# Patient Record
Sex: Male | Born: 1945 | Race: White | Hispanic: No | Marital: Married | Smoking: Current every day smoker
Health system: Southern US, Community
[De-identification: ages and names within clinical notes are randomized; demographics above are authoritative.]

## PROBLEM LIST (undated history)

## (undated) DIAGNOSIS — K2100 Gastro-esophageal reflux disease with esophagitis, without bleeding: Secondary | ICD-10-CM

## (undated) DIAGNOSIS — R0600 Dyspnea, unspecified: Secondary | ICD-10-CM

## (undated) DIAGNOSIS — K635 Polyp of colon: Secondary | ICD-10-CM

## (undated) DIAGNOSIS — S0590XA Unspecified injury of unspecified eye and orbit, initial encounter: Secondary | ICD-10-CM

## (undated) DIAGNOSIS — Z8619 Personal history of other infectious and parasitic diseases: Secondary | ICD-10-CM

## (undated) DIAGNOSIS — K219 Gastro-esophageal reflux disease without esophagitis: Secondary | ICD-10-CM

## (undated) DIAGNOSIS — J449 Chronic obstructive pulmonary disease, unspecified: Secondary | ICD-10-CM

## (undated) DIAGNOSIS — J189 Pneumonia, unspecified organism: Secondary | ICD-10-CM

## (undated) DIAGNOSIS — C3490 Malignant neoplasm of unspecified part of unspecified bronchus or lung: Secondary | ICD-10-CM

## (undated) DIAGNOSIS — G4730 Sleep apnea, unspecified: Secondary | ICD-10-CM

## (undated) DIAGNOSIS — D369 Benign neoplasm, unspecified site: Secondary | ICD-10-CM

## (undated) DIAGNOSIS — E669 Obesity, unspecified: Secondary | ICD-10-CM

## (undated) DIAGNOSIS — K2270 Barrett's esophagus without dysplasia: Secondary | ICD-10-CM

## (undated) DIAGNOSIS — Z8701 Personal history of pneumonia (recurrent): Secondary | ICD-10-CM

## (undated) DIAGNOSIS — J45909 Unspecified asthma, uncomplicated: Secondary | ICD-10-CM

## (undated) HISTORY — PX: COLONOSCOPY: SHX174

## (undated) HISTORY — PX: EYE SURGERY: SHX253

## (undated) HISTORY — PX: CATARACT EXTRACTION: SUR2

## (undated) HISTORY — DX: Malignant neoplasm of unspecified part of unspecified bronchus or lung: C34.90

---

## 2005-09-30 ENCOUNTER — Ambulatory Visit: Payer: Self-pay | Admitting: Ophthalmology

## 2007-08-26 ENCOUNTER — Ambulatory Visit: Payer: Self-pay | Admitting: Ophthalmology

## 2007-09-07 ENCOUNTER — Ambulatory Visit: Payer: Self-pay | Admitting: Ophthalmology

## 2007-11-27 ENCOUNTER — Ambulatory Visit: Payer: Self-pay | Admitting: Gastroenterology

## 2012-03-27 ENCOUNTER — Ambulatory Visit: Payer: Self-pay | Admitting: Gastroenterology

## 2013-04-08 ENCOUNTER — Ambulatory Visit: Payer: Self-pay | Admitting: Hematology and Oncology

## 2013-04-09 LAB — CBC CANCER CENTER
BASOS ABS: 1 %
Bands: 4 %
Basophil #: 0 x10 3/mm (ref 0.0–0.1)
Basophil %: 0.3 %
Comment - H1-Com5: NORMAL
EOS PCT: 2 %
Eosinophil #: 0.2 x10 3/mm (ref 0.0–0.7)
Eosinophil %: 1.2 %
HCT: 53.8 % — ABNORMAL HIGH (ref 40.0–52.0)
HGB: 18.1 g/dL — ABNORMAL HIGH (ref 13.0–18.0)
Lymphocyte #: 3.8 x10 3/mm — ABNORMAL HIGH (ref 1.0–3.6)
Lymphocyte %: 29.1 %
Lymphocytes: 18 %
MCH: 31.1 pg (ref 26.0–34.0)
MCHC: 33.6 g/dL (ref 32.0–36.0)
MCV: 93 fL (ref 80–100)
MONOS PCT: 8 %
Monocyte #: 1.1 x10 3/mm — ABNORMAL HIGH (ref 0.2–1.0)
Monocyte %: 8.2 %
NEUTROS ABS: 8.1 x10 3/mm — AB (ref 1.4–6.5)
Neutrophil %: 61.2 %
Platelet: 169 x10 3/mm (ref 150–440)
RBC: 5.8 10*6/uL (ref 4.40–5.90)
RDW: 14.3 % (ref 11.5–14.5)
SEGMENTED NEUTROPHILS: 57 %
Variant Lymphocyte: 10 %
WBC: 13.1 x10 3/mm — ABNORMAL HIGH (ref 3.8–10.6)

## 2013-04-25 ENCOUNTER — Ambulatory Visit: Payer: Self-pay | Admitting: Hematology and Oncology

## 2013-05-13 LAB — CBC CANCER CENTER
BASOS PCT: 0.5 %
Basophil #: 0.1 x10 3/mm (ref 0.0–0.1)
EOS ABS: 0.2 x10 3/mm (ref 0.0–0.7)
Eosinophil %: 1.5 %
HCT: 51.8 % (ref 40.0–52.0)
HGB: 17.3 g/dL (ref 13.0–18.0)
Lymphocyte #: 3.2 x10 3/mm (ref 1.0–3.6)
Lymphocyte %: 28.3 %
MCH: 31 pg (ref 26.0–34.0)
MCHC: 33.4 g/dL (ref 32.0–36.0)
MCV: 93 fL (ref 80–100)
Monocyte #: 1 x10 3/mm (ref 0.2–1.0)
Monocyte %: 8.3 %
Neutrophil #: 7 x10 3/mm — ABNORMAL HIGH (ref 1.4–6.5)
Neutrophil %: 61.4 %
Platelet: 170 x10 3/mm (ref 150–440)
RBC: 5.59 10*6/uL (ref 4.40–5.90)
RDW: 14.6 % — ABNORMAL HIGH (ref 11.5–14.5)
WBC: 11.4 x10 3/mm — ABNORMAL HIGH (ref 3.8–10.6)

## 2013-05-26 ENCOUNTER — Ambulatory Visit: Payer: Self-pay | Admitting: Hematology and Oncology

## 2013-11-26 ENCOUNTER — Ambulatory Visit: Payer: Self-pay | Admitting: Unknown Physician Specialty

## 2015-05-18 ENCOUNTER — Encounter: Payer: Self-pay | Admitting: *Deleted

## 2015-05-19 ENCOUNTER — Encounter: Payer: Self-pay | Admitting: *Deleted

## 2015-05-19 ENCOUNTER — Ambulatory Visit: Payer: 59 | Admitting: Anesthesiology

## 2015-05-19 ENCOUNTER — Ambulatory Visit
Admission: RE | Admit: 2015-05-19 | Discharge: 2015-05-19 | Disposition: A | Discharge status: Home / Self Care | Payer: 59 | Source: Ambulatory Visit | Attending: Gastroenterology | Admitting: Gastroenterology

## 2015-05-19 ENCOUNTER — Encounter
Admission: RE | Disposition: A | Discharge status: Home / Self Care | Payer: Self-pay | Source: Ambulatory Visit | Attending: Gastroenterology

## 2015-05-19 DIAGNOSIS — D125 Benign neoplasm of sigmoid colon: Secondary | ICD-10-CM | POA: Insufficient documentation

## 2015-05-19 DIAGNOSIS — K2270 Barrett's esophagus without dysplasia: Secondary | ICD-10-CM | POA: Diagnosis present

## 2015-05-19 DIAGNOSIS — K5730 Diverticulosis of large intestine without perforation or abscess without bleeding: Secondary | ICD-10-CM | POA: Insufficient documentation

## 2015-05-19 DIAGNOSIS — K210 Gastro-esophageal reflux disease with esophagitis: Secondary | ICD-10-CM | POA: Insufficient documentation

## 2015-05-19 DIAGNOSIS — K2950 Unspecified chronic gastritis without bleeding: Secondary | ICD-10-CM | POA: Insufficient documentation

## 2015-05-19 DIAGNOSIS — K2960 Other gastritis without bleeding: Secondary | ICD-10-CM | POA: Insufficient documentation

## 2015-05-19 DIAGNOSIS — Z87891 Personal history of nicotine dependence: Secondary | ICD-10-CM | POA: Insufficient documentation

## 2015-05-19 DIAGNOSIS — Z6841 Body Mass Index (BMI) 40.0 and over, adult: Secondary | ICD-10-CM | POA: Insufficient documentation

## 2015-05-19 DIAGNOSIS — Z1211 Encounter for screening for malignant neoplasm of colon: Secondary | ICD-10-CM | POA: Diagnosis not present

## 2015-05-19 DIAGNOSIS — D124 Benign neoplasm of descending colon: Secondary | ICD-10-CM | POA: Insufficient documentation

## 2015-05-19 DIAGNOSIS — E669 Obesity, unspecified: Secondary | ICD-10-CM | POA: Insufficient documentation

## 2015-05-19 DIAGNOSIS — K3189 Other diseases of stomach and duodenum: Secondary | ICD-10-CM | POA: Insufficient documentation

## 2015-05-19 DIAGNOSIS — D123 Benign neoplasm of transverse colon: Secondary | ICD-10-CM | POA: Insufficient documentation

## 2015-05-19 DIAGNOSIS — K2210 Ulcer of esophagus without bleeding: Secondary | ICD-10-CM | POA: Diagnosis not present

## 2015-05-19 DIAGNOSIS — G4730 Sleep apnea, unspecified: Secondary | ICD-10-CM | POA: Diagnosis not present

## 2015-05-19 DIAGNOSIS — K2980 Duodenitis without bleeding: Secondary | ICD-10-CM | POA: Insufficient documentation

## 2015-05-19 HISTORY — DX: Pneumonia, unspecified organism: J18.9

## 2015-05-19 HISTORY — DX: Polyp of colon: K63.5

## 2015-05-19 HISTORY — DX: Barrett's esophagus without dysplasia: K22.70

## 2015-05-19 HISTORY — DX: Unspecified injury of unspecified eye and orbit, initial encounter: S05.90XA

## 2015-05-19 HISTORY — DX: Personal history of other infectious and parasitic diseases: Z86.19

## 2015-05-19 HISTORY — DX: Personal history of pneumonia (recurrent): Z87.01

## 2015-05-19 HISTORY — PX: ESOPHAGOGASTRODUODENOSCOPY (EGD) WITH PROPOFOL: SHX5813

## 2015-05-19 HISTORY — DX: Sleep apnea, unspecified: G47.30

## 2015-05-19 HISTORY — PX: COLONOSCOPY WITH PROPOFOL: SHX5780

## 2015-05-19 HISTORY — DX: Benign neoplasm, unspecified site: D36.9

## 2015-05-19 HISTORY — DX: Obesity, unspecified: E66.9

## 2015-05-19 SURGERY — COLONOSCOPY WITH PROPOFOL
Anesthesia: General

## 2015-05-19 MED ORDER — FENTANYL CITRATE (PF) 100 MCG/2ML IJ SOLN
INTRAMUSCULAR | Status: DC | PRN
  Administered 2015-05-19: 50 ug via INTRAVENOUS

## 2015-05-19 MED ORDER — PROPOFOL 10 MG/ML IV BOLUS
INTRAVENOUS | Status: DC | PRN
  Administered 2015-05-19: 100 mg via INTRAVENOUS

## 2015-05-19 MED ORDER — SODIUM CHLORIDE 0.9 % IV SOLN
INTRAVENOUS | Status: DC
  Administered 2015-05-19: 10:00:00 via INTRAVENOUS

## 2015-05-19 MED ORDER — PROPOFOL 500 MG/50ML IV EMUL
INTRAVENOUS | Status: DC | PRN
  Administered 2015-05-19: 150 ug/kg/min via INTRAVENOUS

## 2015-05-19 MED ORDER — PHENYLEPHRINE HCL 10 MG/ML IJ SOLN
INTRAMUSCULAR | Status: DC | PRN
  Administered 2015-05-19: 50 ug via INTRAVENOUS
  Administered 2015-05-19: 150 ug via INTRAVENOUS

## 2015-05-19 MED ORDER — SODIUM CHLORIDE 0.9 % IV SOLN: INTRAVENOUS | Status: DC

## 2015-05-19 NOTE — Transfer of Care (Signed)
Immediate Anesthesia Transfer of Care Note  Patient: David Johnson  Procedure(s) Performed: Procedure(s): COLONOSCOPY WITH PROPOFOL (N/A) ESOPHAGOGASTRODUODENOSCOPY (EGD) WITH PROPOFOL (N/A)  Patient Location: PACU  Anesthesia Type:General  Level of Consciousness: awake and alert   Airway & Oxygen Therapy: Patient Spontanous Breathing and Patient connected to nasal cannula oxygen  Post-op Assessment: Report given to RN and Post -op Vital signs reviewed and stable  Post vital signs: Reviewed and stable  Last Vitals:  Filed Vitals:   05/19/15 1002  BP: 154/66  Pulse: 58  Temp: 36.4 C  Resp: 16    Complications: No apparent anesthesia complications

## 2015-05-19 NOTE — Anesthesia Preprocedure Evaluation (Signed)
Anesthesia Evaluation  Patient identified by MRN, date of birth, ID band Patient awake    Reviewed: Allergy & Precautions, H&P , NPO status , Patient's Chart, lab work & pertinent test results, reviewed documented beta blocker date and time   Airway Mallampati: II   Neck ROM: full    Dental  (+) Poor Dentition   Pulmonary neg pulmonary ROS, sleep apnea and Continuous Positive Airway Pressure Ventilation , pneumonia, resolved, former smoker,    Pulmonary exam normal        Cardiovascular negative cardio ROS Normal cardiovascular exam     Neuro/Psych negative neurological ROS  negative psych ROS   GI/Hepatic negative GI ROS, Neg liver ROS,   Endo/Other  negative endocrine ROS  Renal/GU negative Renal ROS  negative genitourinary   Musculoskeletal   Abdominal   Peds  Hematology negative hematology ROS (+)   Anesthesia Other Findings Past Medical History:   Adenomatous polyps                                           Barrett's esophagus                                          Colon polyp                                                  Eye injury                                                   History of gonorrhea                                         History of pneumonia                                         Obesity                                                      Sleep apnea                                                  Pneumonia                                                  Past Surgical History:   EYE SURGERY  CATARACT EXTRACTION                             Left              COLONOSCOPY                                                 BMI    Body Mass Index   47.45 kg/m 2     Reproductive/Obstetrics negative OB ROS                             Anesthesia Physical Anesthesia Plan  ASA: III  Anesthesia Plan: General    Post-op Pain Management:    Induction:   Airway Management Planned:   Additional Equipment:   Intra-op Plan:   Post-operative Plan:   Informed Consent: I have reviewed the patients History and Physical, chart, labs and discussed the procedure including the risks, benefits and alternatives for the proposed anesthesia with the patient or authorized representative who has indicated his/her understanding and acceptance.   Dental Advisory Given  Plan Discussed with: CRNA  Anesthesia Plan Comments:         Anesthesia Quick Evaluation

## 2015-05-19 NOTE — H&P (Addendum)
Outpatient short stay form Pre-procedure 05/19/2015 11:21 AM David Sails MD  Primary Physician: Dr. Maryland Johnson  Reason for visit:  EGD and colonoscopy  History of present illness:  Patient is a 70 year old male presenting today as above. He has a personal history of Barrett's esophagus as well as adenomatous colon polyps. His last procedures were 2013. He had multiple adenomatous colon polyps. EGD at that time however did not show any evidence of Barrett's esophagus. Overall currently he is doing well. He denies any abdominal pain and dyspepsia and nausea vomiting problems swallowing bowel habit changes melena or hematochezia. He does have a history of sleep apnea.    Current facility-administered medications:  .  0.9 %  sodium chloride infusion, , Intravenous, Continuous, David Sails, MD, Last Rate: 20 mL/hr at 05/19/15 1023 .  0.9 %  sodium chloride infusion, , Intravenous, Continuous, David Sails, MD  No prescriptions prior to admission     Allergies  Allergen Reactions  . Cephalexin      Past Medical History  Diagnosis Date  . Adenomatous polyps   . Barrett's esophagus   . Colon polyp   . Eye injury   . History of gonorrhea   . History of pneumonia   . Obesity   . Sleep apnea   . Pneumonia     Review of systems:      Physical Exam    Heart and lungs: Regular rate and rhythm without rub or gallop, lungs are bilaterally clear.    HEENT: Normocephalic atraumatic eyes are anicteric    Other:     Pertinant exam for procedure: Soft protuberant nontender nondistended bowel sounds are positive    Planned proceedures: EGD and colonoscopy with indicated procedures. I have discussed the risks benefits and complications of procedures to include not limited to bleeding, infection, perforation and the risk of sedation and the patient wishes to proceed.    David Sails, MD Gastroenterology 05/19/2015  11:21 AM

## 2015-05-19 NOTE — Op Note (Signed)
[facility] Gastroenterology Patient Name: David Johnson Procedure Date: 05/19/2015 11:29 AM MRN: [id number] Account #: [id number] Date of Birth: 11-Mar-1945 Admit Type: Outpatient Age: 70 Room: [facility] ENDO ROOM 3 Gender: Male Note Status: Finalized Procedure:            Colonoscopy Indications:          Personal history of colonic polyps Providers:            [name], MD Referring MD:         Hewitt Blade. Sarina Ser, MD (Referring MD) Medicines:            Monitored Anesthesia Care Complications:        No immediate complications. Procedure:            Pre-Anesthesia Assessment:                       - ASA Grade Assessment: III - A patient with severe                        systemic disease.                       After obtaining informed consent, the colonoscope was                        passed under direct vision. Throughout the procedure,                        the patient's blood pressure, pulse, and oxygen                        saturations were monitored continuously. The                        Colonoscope was introduced through the anus and                        advanced to the the cecum, identified by appendiceal                        orifice and ileocecal valve. The colonoscopy was                        unusually difficult due to poor bowel prep, significant                        looping and a tortuous colon. Successful completion of                        the procedure was aided by changing the patient to a                        supine position, changing the patient to a prone                        position, using manual pressure, withdrawing and                        reinserting the scope, applying abdominal pressure and  lavage. The quality of the bowel preparation was fair. Findings:      A 5 mm polyp was found in the descending colon. The polyp was sessile.       The polyp was removed with a cold snare. Resection and  retrieval were       complete.      A 3 mm polyp was found in the transverse colon. The polyp was flat. The       polyp was removed with a cold biopsy forceps. Resection and retrieval       were complete.      Three flat polyps were found in the descending colon. The polyps were 2       to 3 mm in size. These polyps were removed with a cold biopsy forceps.       Resection and retrieval were complete.      A 2 mm polyp was found in the distal descending colon. The polyp was       flat. The polyp was removed with a cold biopsy forceps. Resection and       retrieval were complete.      Three sessile polyps were found in the sigmoid colon. The polyps were 2       to 3 mm in size. These polyps were removed with a cold biopsy forceps.       Resection and retrieval were complete.      Multiple small-mouthed diverticula were found in the sigmoid colon and       descending colon.      The retroflexed view of the distal rectum and anal verge was normal and       showed no anal or rectal abnormalities.      The digital rectal exam was normal.      The base of the cecum couold not be fully intubated due top body habitus       and looping, however the base was seen from proximal ascending and       appeared normal. Impression:           - Preparation of the colon was fair.                       - One 5 mm polyp in the descending colon, removed with                        a cold snare. Resected and retrieved.                       - One 3 mm polyp in the transverse colon, removed with                        a cold biopsy forceps. Resected and retrieved.                       - Three 2 to 3 mm polyps in the descending colon,                        removed with a cold biopsy forceps. Resected and                        retrieved.                       -  One 2 mm polyp in the distal descending colon,                        removed with a cold biopsy forceps. Resected and                         retrieved.                       - Three 2 to 3 mm polyps in the sigmoid colon, removed                        with a cold biopsy forceps. Resected and retrieved.                       - Diverticulosis in the sigmoid colon and in the                        descending colon.                       - The distal rectum and anal verge are normal on                        retroflexion view. Recommendation:       - Discharge patient to home.                       - Await pathology results.                       - Telephone GI clinic for pathology results in 1 week. Procedure Code(s):    --- Professional ---                       [phone], Colonoscopy, flexible; with removal of tumor(s),                        polyp(s), or other lesion(s) by snare technique                       [id number], 85, Colonoscopy, flexible; with biopsy, single                        or multiple Diagnosis Code(s):    --- Professional ---                       D12.3, Benign neoplasm of transverse colon (hepatic                        flexure or splenic flexure)                       D12.4, Benign neoplasm of descending colon                       D12.5, Benign neoplasm of sigmoid colon                       Z86.010, Personal history of colonic polyps  K57.30, Diverticulosis of large intestine without                        perforation or abscess without bleeding CPT copyright 2016 American Medical Association. All rights reserved. The codes documented in this report are preliminary and upon coder review may  be revised to meet current compliance requirements. [name], MD 05/19/2015 1:00:57 PM This report has been signed electronically. Number of Addenda: 0 Note Initiated On: 05/19/2015 11:29 AM Scope Withdrawal Time: 0 hours 24 minutes 15 seconds  Total Procedure Duration: 0 hours 47 minutes 25 seconds       [facility]

## 2015-05-19 NOTE — Op Note (Signed)
[facility] Gastroenterology Patient Name: David Johnson Procedure Date: 05/19/2015 11:30 AM MRN: [id number] Account #: [id number] Date of Birth: [birth year] Admit Type: Outpatient Age: 70 Room: [facility] ENDO ROOM 3 Gender: Male Note Status: Finalized Procedure:            Upper GI endoscopy Indications:          Follow-up of Barrett's esophagus Providers:            [name], MD Referring MD:         Hewitt Blade. Sarina Ser, MD (Referring MD) Medicines:            Monitored Anesthesia Care Complications:        No immediate complications. Procedure:            Pre-Anesthesia Assessment:                       - ASA Grade Assessment: III - A patient with severe                        systemic disease.                       After obtaining informed consent, the endoscope was                        passed under direct vision. Throughout the procedure,                        the patient's blood pressure, pulse, and oxygen                        saturations were monitored continuously. The Endoscope                        was introduced through the mouth, and advanced to the                        third part of duodenum. The upper GI endoscopy was                        accomplished without difficulty. The patient tolerated                        the procedure well. Findings:      LA Grade D (one or more mucosal breaks involving at least 75% of       esophageal circumference) esophagitis with no bleeding was found.       Biopsies were taken with a cold forceps for histology. Mucosa was       biopsied with a cold forceps for histology in 4 quadrants at the       gastroesophageal junction.      The exam of the esophagus was otherwise normal.      Patchy moderate inflammation characterized by congestion (edema),       erythema, friability and granularity was found in the gastric body and       in the gastric antrum. Biopsies were taken with a cold forceps for   histology. Biopsies were taken with a cold forceps for histology and       Helicobacter pylori testing.      A single 7  mm submucosal papule (nodule) with no bleeding and no       stigmata of recent bleeding was found on the posterior wall of the       gastric body. Biopsies were taken with a cold forceps for histology.      A single 3 mm mucosal papule (nodule) with no bleeding and no stigmata       of recent bleeding was found in the gastric fundus. Biopsies were taken       with a cold forceps for histology.      Diffuse and patchy moderate inflammation characterized by congestion       (edema) and erythema was found in the duodenal bulb. Biopsies were taken       with a cold forceps for histology.      The exam of the duodenum was otherwise normal.      The cardia and gastric fundus were normal on retroflexion otherwise. Impression:           - LA Grade D erosive esophagitis. Biopsied.                       - Erosive gastritis. Biopsied.                       - A single submucosal papule (nodule) found in the                        stomach. Biopsied.                       - A single mucosal papule (nodule) found in the                        stomach. Biopsied.                       - Duodenitis. Biopsied. Recommendation:       - Return to GI office in 3 weeks.                       - Await pathology results.                       - Use Protonix (pantoprazole) 40 mg PO daily daily. Procedure Code(s):    --- Professional ---                       [phone], Esophagogastroduodenoscopy, flexible, transoral;                        with biopsy, single or multiple Diagnosis Code(s):    --- Professional ---                       K20.8, Other esophagitis                       K29.60, Other gastritis without bleeding                       K31.89, Other diseases of stomach and duodenum                       K29.80, Duodenitis without bleeding  K22.70, Barrett's esophagus  without dysplasia CPT copyright 2016 American Medical Association. All rights reserved. The codes documented in this report are preliminary and upon coder review may  be revised to meet current compliance requirements. [name], MD 05/19/2015 12:04:48 PM This report has been signed electronically. Number of Addenda: 0 Note Initiated On: 05/19/2015 11:30 AM      [facility]

## 2015-05-20 ENCOUNTER — Encounter: Payer: Self-pay | Admitting: Gastroenterology

## 2015-05-22 NOTE — Anesthesia Postprocedure Evaluation (Signed)
Anesthesia Post Note  Patient: David Johnson  Procedure(s) Performed: Procedure(s) (LRB): COLONOSCOPY WITH PROPOFOL (N/A) ESOPHAGOGASTRODUODENOSCOPY (EGD) WITH PROPOFOL (N/A)  Patient location during evaluation: PACU Anesthesia Type: General Level of consciousness: awake and alert Pain management: pain level controlled Vital Signs Assessment: post-procedure vital signs reviewed and stable Respiratory status: spontaneous breathing, nonlabored ventilation, respiratory function stable and patient connected to nasal cannula oxygen Cardiovascular status: blood pressure returned to baseline and stable Postop Assessment: no signs of nausea or vomiting Anesthetic complications: no    Last Vitals:  Filed Vitals:   05/19/15 1313 05/19/15 1324  BP: 101/59 121/90  Pulse: 85 85  Temp:    Resp: 15 20    Last Pain:  Filed Vitals:   05/20/15 1306  PainSc: 0-No pain                 Molli Barrows

## 2015-05-23 LAB — SURGICAL PATHOLOGY

## 2015-12-04 ENCOUNTER — Ambulatory Visit (INDEPENDENT_AMBULATORY_CARE_PROVIDER_SITE_OTHER): Payer: 59 | Admitting: Vascular Surgery

## 2016-05-30 ENCOUNTER — Inpatient Hospital Stay: Payer: 59 | Admitting: Oncology

## 2016-06-03 ENCOUNTER — Ambulatory Visit
Admission: RE | Admit: 2016-06-03 | Discharge: 2016-06-03 | Disposition: A | Discharge status: Home / Self Care | Payer: 59 | Source: Ambulatory Visit | Attending: Oncology | Admitting: Oncology

## 2016-06-03 ENCOUNTER — Encounter: Payer: Self-pay | Admitting: Oncology

## 2016-06-03 ENCOUNTER — Inpatient Hospital Stay: Payer: 59 | Attending: Oncology | Admitting: Oncology

## 2016-06-03 VITALS — BP 144/82 | HR 81 | Temp 97.9°F | Resp 20 | Ht 72.84 in | Wt 334.9 lb

## 2016-06-03 DIAGNOSIS — D71 Functional disorders of polymorphonuclear neutrophils: Secondary | ICD-10-CM | POA: Diagnosis not present

## 2016-06-03 DIAGNOSIS — G4730 Sleep apnea, unspecified: Secondary | ICD-10-CM | POA: Insufficient documentation

## 2016-06-03 DIAGNOSIS — K219 Gastro-esophageal reflux disease without esophagitis: Secondary | ICD-10-CM | POA: Insufficient documentation

## 2016-06-03 DIAGNOSIS — D72829 Elevated white blood cell count, unspecified: Secondary | ICD-10-CM | POA: Diagnosis not present

## 2016-06-03 DIAGNOSIS — D751 Secondary polycythemia: Secondary | ICD-10-CM

## 2016-06-03 DIAGNOSIS — K2270 Barrett's esophagus without dysplasia: Secondary | ICD-10-CM | POA: Diagnosis not present

## 2016-06-03 DIAGNOSIS — G8929 Other chronic pain: Secondary | ICD-10-CM | POA: Diagnosis not present

## 2016-06-03 DIAGNOSIS — G4733 Obstructive sleep apnea (adult) (pediatric): Secondary | ICD-10-CM | POA: Insufficient documentation

## 2016-06-03 DIAGNOSIS — E669 Obesity, unspecified: Secondary | ICD-10-CM | POA: Insufficient documentation

## 2016-06-03 DIAGNOSIS — Z79899 Other long term (current) drug therapy: Secondary | ICD-10-CM

## 2016-06-03 DIAGNOSIS — F17210 Nicotine dependence, cigarettes, uncomplicated: Secondary | ICD-10-CM | POA: Insufficient documentation

## 2016-06-03 DIAGNOSIS — M549 Dorsalgia, unspecified: Secondary | ICD-10-CM | POA: Diagnosis not present

## 2016-06-03 NOTE — Progress Notes (Signed)
New patient for evaluation of elevated hemoglobin. Patient has some emphysema, not officially diagnosed. He smokes 1-1/2 packs cigarettes per day with a > 40 yr history.  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 No recent changes in appetite or energy.

## 2016-06-03 NOTE — Progress Notes (Signed)
Hematology/Oncology Consult note [facility] [phone] [phone]  Patient Care Team: Maryland Pink, MD as PCP - General (Family Medicine)   Name of the patient: David Johnson  [id number]  07-11-1945    Reason for referral- polycythemia   Referring physician- Dr. Maryland Pink  Date of visit: 06/03/16   History of presenting illness- patient is a 71 year old male with a past medical history significant for morbid obesity, GERD, obstructive sleep apnea and Barrett's esophagus. He is been referred to [place] for evaluation and management of polycythemia. Recent CBC from 05/17/2016 showed white count of 12.8, H&H of 19.8/55.5 and platelet count of 177. Looking back at his prior CBCs in 2017 as well as 2016 his H&H was 18.9/55.6 and 18.4/53.2 respectively. LFTs have been within normal limits. Recent urinalysis from March 2018 did not reveal any hematuria. Patient has had JAK2 and exon 12 testing in 2016 which was negative. 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 He is trying to cut down. He denies any symptoms of headaches, blurry vision, chest pain or strokelike symptoms. He has chronic back pain.  ECOG PS- 2  Pain scale- 1   Review of systems- Review of Systems  Constitutional: Negative for chills, fever, malaise/fatigue and weight loss.  HENT: Negative for congestion, ear discharge and nosebleeds.   Eyes: Negative for blurred vision.  Respiratory: Negative for cough, hemoptysis, sputum production, shortness of breath and wheezing.   Cardiovascular: Negative for chest pain, palpitations, orthopnea and claudication.  Gastrointestinal: Negative for abdominal pain, blood in stool, constipation,  diarrhea, heartburn, melena, nausea and vomiting.  Genitourinary: Negative for dysuria, flank pain, frequency, hematuria and urgency.  Musculoskeletal: Positive for back pain. Negative for joint pain and myalgias.  Skin: Negative for rash.  Neurological: Negative for dizziness, tingling, focal weakness, seizures, weakness and headaches.  Endo/Heme/Allergies: Does not bruise/bleed easily.  Psychiatric/Behavioral: Negative for depression and suicidal ideas. The patient does not have insomnia.     Allergies  Allergen Reactions  . Cephalexin     Patient Active Problem List   Diagnosis Date Noted  . Obesity 06/03/2016  . Sleep apnea 06/03/2016     Past Medical History:  Diagnosis Date  . Adenomatous polyps   . Barrett's esophagus   . Colon polyp   . Eye injury   . History of gonorrhea   . History of pneumonia   . Obesity   . Pneumonia   . Sleep apnea      Past Surgical History:  Procedure Laterality Date  . CATARACT EXTRACTION Left   . COLONOSCOPY    . COLONOSCOPY WITH PROPOFOL N/A 05/19/2015   Procedure: COLONOSCOPY WITH PROPOFOL;  Surgeon: [name], MD;  Location: [facility] ENDOSCOPY;  Service: Endoscopy;  Laterality: N/A;  . ESOPHAGOGASTRODUODENOSCOPY (EGD) WITH PROPOFOL N/A 05/19/2015   Procedure: ESOPHAGOGASTRODUODENOSCOPY (EGD) WITH PROPOFOL;  Surgeon: [name], MD;  Location: [facility] ENDOSCOPY;  Service: Endoscopy;  Laterality: N/A;  . EYE SURGERY      Social History   Social History  . Marital status: Married    Spouse name: N/A  . Number of children: N/A  . Years of education: N/A   Occupational History  . Not on file.   Social History Main Topics  .  Smoking status: Current Every Day Smoker    Packs/day: 1.00    Years: 30.00    Types: Cigarettes    Last attempt to quit: 03/04/2015  . Smokeless tobacco: Never Used  . Alcohol use Yes  . Drug use: No  . Sexual activity: Not on file   Other Topics Concern  . Not on file   Social History  Narrative  . No narrative on file     Family History  Problem Relation Age of Onset  . Emphysema Mother     No current outpatient prescriptions on file.   Physical exam:  Vitals:   06/03/16 1509  BP: (!) 144/82  Pulse: 81  Resp: 20  Temp: 97.9 F (36.6 C)  TempSrc: Tympanic  SpO2: 95%  Weight: (!) 334 lb 14.1 oz (151.9 kg)  Height: 6' 0.83" (1.85 m)   Physical Exam  Constitutional: He is oriented to person, place, and time.  Obese, ambulates with a cane. Appears in no acute distress  HENT:  Head: Normocephalic and atraumatic.  Eyes: EOM are normal. Pupils are equal, round, and reactive to light.  Neck: Normal range of motion.  Cardiovascular: Normal rate, regular rhythm and normal heart sounds.   Pulmonary/Chest: Effort normal and breath sounds normal.  Abdominal: Soft. Bowel sounds are normal.  Neurological: He is alert and oriented to person, place, and time.  Skin: Skin is warm and dry.       No flowsheet data found. CBC Latest Ref Rng & Units 05/13/2013  WBC 3.8 - 10.6 x10 3/mm  11.4(H)  Hemoglobin 13.0 - 18.0 g/dL 17.3  Hematocrit 40.0 - 52.0 % 51.8  Platelets 150 - 440 x10 3/mm  170     Assessment and plan- Patient is a 71 y.o. 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 JAK2 has been negative in the past. Today I will obtain a CBC with differential, CMP, EPO level. Urinalysis was already negative for hematuria. I will obtain chest xray. I will see the patient back in 2 weeks' time and discuss the results of his blood work and decide about phlebotomy at that time. If his EPO level comes back significantly elevated I will consider CT abdomen to rule out intra-abdominal malignancy causing tumor related polycythemia. I will discuss low dose screening CT scan given his significant h/o smoking next time.   Leucocytosis- suspect this is reactive given this is  chronic over last 3 years and has remained stable between 12-13. I will however check flow cytometry and bcr abl testing to r/o primary hematological disorder   Thank you for this kind referral and the opportunity to participate in the care of this patient   Visit Diagnosis 1. Polycythemia, secondary   2. Leukocytosis, unspecified type     Dr. Randa Evens, MD, MPH [facility] Pager- [phone] 06/03/2016  3:32 PM

## 2016-06-06 ENCOUNTER — Encounter: Payer: Self-pay | Admitting: Family Medicine

## 2016-06-18 ENCOUNTER — Inpatient Hospital Stay: Payer: 59 | Admitting: Oncology

## 2016-06-25 ENCOUNTER — Inpatient Hospital Stay: Payer: 59 | Attending: Oncology | Admitting: Oncology

## 2016-06-25 VITALS — BP 134/74 | HR 84 | Temp 96.6°F | Resp 20 | Wt 330.0 lb

## 2016-06-25 DIAGNOSIS — R0602 Shortness of breath: Secondary | ICD-10-CM | POA: Insufficient documentation

## 2016-06-25 DIAGNOSIS — G4730 Sleep apnea, unspecified: Secondary | ICD-10-CM

## 2016-06-25 DIAGNOSIS — F17210 Nicotine dependence, cigarettes, uncomplicated: Secondary | ICD-10-CM | POA: Diagnosis not present

## 2016-06-25 DIAGNOSIS — D72829 Elevated white blood cell count, unspecified: Secondary | ICD-10-CM | POA: Diagnosis not present

## 2016-06-25 DIAGNOSIS — E669 Obesity, unspecified: Secondary | ICD-10-CM | POA: Insufficient documentation

## 2016-06-25 DIAGNOSIS — G8929 Other chronic pain: Secondary | ICD-10-CM

## 2016-06-25 DIAGNOSIS — K219 Gastro-esophageal reflux disease without esophagitis: Secondary | ICD-10-CM | POA: Diagnosis not present

## 2016-06-25 DIAGNOSIS — M549 Dorsalgia, unspecified: Secondary | ICD-10-CM | POA: Insufficient documentation

## 2016-06-25 DIAGNOSIS — Z8711 Personal history of peptic ulcer disease: Secondary | ICD-10-CM | POA: Diagnosis not present

## 2016-06-25 DIAGNOSIS — K2270 Barrett's esophagus without dysplasia: Secondary | ICD-10-CM | POA: Insufficient documentation

## 2016-06-25 DIAGNOSIS — D751 Secondary polycythemia: Secondary | ICD-10-CM | POA: Diagnosis not present

## 2016-06-25 DIAGNOSIS — Z8701 Personal history of pneumonia (recurrent): Secondary | ICD-10-CM | POA: Insufficient documentation

## 2016-06-25 NOTE — Progress Notes (Signed)
Here for follow up

## 2016-06-26 ENCOUNTER — Inpatient Hospital Stay: Payer: 59

## 2016-06-26 DIAGNOSIS — D751 Secondary polycythemia: Secondary | ICD-10-CM | POA: Diagnosis not present

## 2016-06-27 ENCOUNTER — Encounter: Payer: Self-pay | Admitting: Oncology

## 2016-06-27 NOTE — Progress Notes (Signed)
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 He is been referred to [place] for evaluation and management of polycythemia. Recent CBC from 05/17/2016 showed white count of 12.8, H&H of 19.8/55.5 and platelet count of 177. Looking back at his prior CBCs in 2017 as well as 2016 his H&H was 18.9/55.6 and 18.4/53.2 respectively. LFTs have been within normal limits. Recent urinalysis from March 2018 did not reveal any hematuria. Patient has had JAK2 and exon 12 testing in 2016 which was negative. 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 He is trying to cut down. He denies any symptoms of headaches, blurry vision, chest pain or strokelike symptoms. He has chronic back pain.  Blood work from 06/03/16 again showed mild leucocytosis along with elevated hb of 19. Platelet counts normal. EPO level was normal and BCR abl testing was negative for CML  Interval history- Patient is trying to cut down on his smoking. Feels sob on exertion. Denies other complaints  ECOG PS- 1  Review of systems- Review of Systems  Constitutional: Negative for chills, fever, malaise/fatigue and weight loss.  HENT: Negative for congestion, ear discharge and  nosebleeds.   Eyes: Negative for blurred vision.  Respiratory: Negative for cough, hemoptysis, sputum production, shortness of breath and wheezing.   Cardiovascular: Negative for chest pain, palpitations, orthopnea and claudication.  Gastrointestinal: Negative for abdominal pain, blood in stool, constipation, diarrhea, heartburn, melena, nausea and vomiting.  Genitourinary: Negative for dysuria, flank pain, frequency, hematuria and urgency.  Musculoskeletal: Positive for back pain. Negative for joint pain and myalgias.  Skin: Negative for rash.  Neurological: Negative for dizziness, tingling, focal weakness, seizures, weakness and headaches.  Endo/Heme/Allergies: Does not bruise/bleed easily.  Psychiatric/Behavioral: Negative for depression and suicidal ideas. The patient does not have insomnia.       Allergies  Allergen Reactions  . Cephalexin Rash     Past Medical History:  Diagnosis Date  . Adenomatous polyps   . Barrett's esophagus   . Colon polyp   . Eye injury   . History of gonorrhea   . History of pneumonia   . Obesity   . Pneumonia   . Sleep apnea      Past Surgical History:  Procedure Laterality Date  . CATARACT EXTRACTION Left   . COLONOSCOPY    . COLONOSCOPY WITH PROPOFOL N/A 05/19/2015   Procedure: COLONOSCOPY WITH PROPOFOL;  Surgeon: [name], MD;  Location: [facility] ENDOSCOPY;  Service: Endoscopy;  Laterality: N/A;  . ESOPHAGOGASTRODUODENOSCOPY (EGD) WITH PROPOFOL N/A 05/19/2015   Procedure: ESOPHAGOGASTRODUODENOSCOPY (EGD) WITH PROPOFOL;  Surgeon: [name], MD;  Location: [facility] ENDOSCOPY;  Service: Endoscopy;  Laterality: N/A;  . EYE SURGERY      Social History   Social History  . Marital status: Married  Spouse name: N/A  . Number of children: N/A  . Years of education: N/A   Occupational History  . Not on file.   Social History Main Topics  . Smoking status: Current Every Day Smoker    Packs/day: 1.00    Years: 30.00    Types:  Cigarettes    Last attempt to quit: 03/04/2015  . Smokeless tobacco: Never Used  . Alcohol use Yes  . Drug use: No  . Sexual activity: Not on file   Other Topics Concern  . Not on file   Social History Narrative  . No narrative on file    Family History  Problem Relation Age of Onset  . Emphysema Mother     No current outpatient prescriptions on file.  Physical exam:  Vitals:   06/25/16 1541  BP: 134/74  Pulse: 84  Resp: 20  Temp: (!) 96.6 F (35.9 C)  TempSrc: Tympanic  Weight: (!) 330 lb (149.7 kg)   Physical Exam  Constitutional: He is oriented to person, place, and time.  Obese. apperas in no acute distress. Ambulates with a cane  HENT:  Head: Normocephalic and atraumatic.  Eyes: EOM are normal. Pupils are equal, round, and reactive to light.  Neck: Normal range of motion.  Cardiovascular: Normal rate, regular rhythm and normal heart sounds.   Pulmonary/Chest: Effort normal and breath sounds normal.  Abdominal: Soft. Bowel sounds are normal.  Neurological: He is alert and oriented to person, place, and time.  Skin: Skin is warm and dry.     No flowsheet data found. CBC Latest Ref Rng & Units 05/13/2013  WBC 3.8 - 10.6 x10 3/mm  11.4(H)  Hemoglobin 13.0 - 18.0 g/dL 17.3  Hematocrit 40.0 - 52.0 % 51.8  Platelets 150 - 440 x10 3/mm  170    No images are attached to the encounter.  Dg Chest 2 View  Result Date: 06/03/2016 CLINICAL DATA:  Productive cough EXAM: CHEST  2 VIEW COMPARISON:  None. FINDINGS: Cardiac shadow is at the upper limits of normal in size. The lungs are well aerated bilaterally. No focal infiltrate or sizable effusion is seen. Mild degenerative changes of thoracic spine are seen. Scattered small calcified granulomas are noted. IMPRESSION: Changes of prior granulomatous disease. No acute abnormality is seen. Electronically Signed   By: Inez Catalina M.D.   On: 06/03/2016 19:38     Assessment and plan- Patient is a 71 y.o. male referred to [place]  for leucocytosis and polycythemia  Polycythemia- his EPO level is normal and JAK2 and exon 12 testing negative making polycythemia very unlikely. His polycythemia likely due to smoking and/or OSA. However given that his hb is significantly elevated between 18-19 and has even exceeded 20 on one occasion, I would like to get BM bbiopsy to rule out myeloproliferative disorder. Typically, I would not be in favor of phlebotomy for secondary polycythemia but as his hemoglobin is close to 20, I am concerned about his risk of stroke and MI. I will proceed with phlebotomy Q2 weeks 250 cc until his hb <16 and hct <55. I will see him back in about 6 weeks after his BM biopsy  Leucocytosis- mild and likely reactive due to smoking. BM biopsy to rule out myeloproliferative disorder  Tobacco dependence- I will refer him to [facility] for low dose screening CT lung program. He is trying to quit smoking on his own   Visit Diagnosis 1. Leukocytosis, unspecified type   2. Polycythemia  Dr. Randa Evens, MD, MPH [place] at [facility] Pager- [phone] 06/27/2016 8:19 AM

## 2016-07-01 ENCOUNTER — Telehealth: Payer: Self-pay | Admitting: *Deleted

## 2016-07-01 NOTE — Telephone Encounter (Signed)
Patient contacted in regards to biopsy instructions. Date 07/09/2016 Time 7:30 AM Arrive at [facility] at the admitting/registration desk.  Must have driver to take you home.  Nothing by mouth after midnight the night before.  Take medications in AM with small sip of water.  Call Moishe Spice RN if you have questions.  Wife read back instructions and verbalized understandin.

## 2016-07-02 ENCOUNTER — Inpatient Hospital Stay: Payer: 59

## 2016-07-02 VITALS — BP 116/71 | HR 60 | Temp 96.3°F | Resp 18

## 2016-07-02 DIAGNOSIS — D751 Secondary polycythemia: Secondary | ICD-10-CM | POA: Diagnosis not present

## 2016-07-02 MED ORDER — SODIUM CHLORIDE 0.9 % IV SOLN
Freq: Once | INTRAVENOUS | Status: DC
  Filled 2016-07-02: qty 1000

## 2016-07-02 NOTE — Progress Notes (Signed)
Patient states, "I do not need any IV fluids today. I did not get any after my first phlebotomy either and did fine." MD, Dr. Janese Banks, notified via telephone. Per MD order: IVF discontinued. Patient can be discharged post phlebotomy.

## 2016-07-08 ENCOUNTER — Other Ambulatory Visit: Payer: Self-pay | Admitting: Radiology

## 2016-07-09 ENCOUNTER — Ambulatory Visit
Admission: RE | Admit: 2016-07-09 | Discharge: 2016-07-09 | Disposition: A | Discharge status: Home / Self Care | Payer: 59 | Source: Ambulatory Visit | Attending: Oncology | Admitting: Oncology

## 2016-07-09 ENCOUNTER — Other Ambulatory Visit (HOSPITAL_COMMUNITY)
Admission: RE | Admit: 2016-07-09 | Discharge: 2016-07-09 | Disposition: A | Discharge status: Home / Self Care | Payer: 59 | Source: Ambulatory Visit | Attending: Oncology | Admitting: Oncology

## 2016-07-09 DIAGNOSIS — D72829 Elevated white blood cell count, unspecified: Secondary | ICD-10-CM | POA: Insufficient documentation

## 2016-07-09 DIAGNOSIS — D751 Secondary polycythemia: Secondary | ICD-10-CM | POA: Insufficient documentation

## 2016-07-09 HISTORY — DX: Chronic obstructive pulmonary disease, unspecified: J44.9

## 2016-07-09 HISTORY — DX: Gastro-esophageal reflux disease without esophagitis: K21.9

## 2016-07-09 HISTORY — DX: Dyspnea, unspecified: R06.00

## 2016-07-09 LAB — CBC WITH DIFFERENTIAL/PLATELET
Basophils Absolute: 0.1 10*3/uL (ref 0–0.1)
Basophils Relative: 1 %
EOS PCT: 2 %
Eosinophils Absolute: 0.2 10*3/uL (ref 0–0.7)
HCT: 52 % (ref 40.0–52.0)
Hemoglobin: 17.8 g/dL (ref 13.0–18.0)
LYMPHS ABS: 2.5 10*3/uL (ref 1.0–3.6)
Lymphocytes Relative: 24 %
MCH: 31.7 pg (ref 26.0–34.0)
MCHC: 34.2 g/dL (ref 32.0–36.0)
MCV: 92.6 fL (ref 80.0–100.0)
MONO ABS: 0.9 10*3/uL (ref 0.2–1.0)
MONOS PCT: 8 %
Neutro Abs: 6.8 10*3/uL — ABNORMAL HIGH (ref 1.4–6.5)
Neutrophils Relative %: 65 %
PLATELETS: 181 10*3/uL (ref 150–440)
RBC: 5.61 MIL/uL (ref 4.40–5.90)
RDW: 14.2 % (ref 11.5–14.5)
WBC: 10.4 10*3/uL (ref 3.8–10.6)

## 2016-07-09 LAB — BASIC METABOLIC PANEL
Anion gap: 7 (ref 5–15)
BUN: 15 mg/dL (ref 6–20)
CHLORIDE: 109 mmol/L (ref 101–111)
CO2: 24 mmol/L (ref 22–32)
CREATININE: 0.81 mg/dL (ref 0.61–1.24)
Calcium: 9 mg/dL (ref 8.9–10.3)
GFR calc Af Amer: >60 mL/min (ref >60)
GFR calc non Af Amer: >60 mL/min (ref >60)
GLUCOSE: 103 mg/dL — AB (ref 65–99)
Potassium: 3.9 mmol/L (ref 3.5–5.1)
Sodium: 140 mmol/L (ref 135–145)

## 2016-07-09 LAB — PROTIME-INR
INR: 0.99
Prothrombin Time: 13.1 seconds (ref 11.4–15.2)

## 2016-07-09 MED ORDER — FENTANYL CITRATE (PF) 100 MCG/2ML IJ SOLN
INTRAMUSCULAR | Status: AC | PRN
  Administered 2016-07-09: 25 ug via INTRAVENOUS

## 2016-07-09 MED ORDER — HYDROCODONE-ACETAMINOPHEN 5-325 MG PO TABS
1.0000 | ORAL_TABLET | ORAL | Status: DC | PRN
  Filled 2016-07-09: qty 2

## 2016-07-09 MED ORDER — SODIUM CHLORIDE 0.9 % IV SOLN
INTRAVENOUS | Status: DC
  Administered 2016-07-09: 08:00:00 via INTRAVENOUS

## 2016-07-09 NOTE — Procedures (Signed)
Ct Bone Marrow Biopsy without difficulty  Complications:  None  Blood Loss: none  See dictation in canopy pacs  

## 2016-07-09 NOTE — Discharge Instructions (Signed)
Needle Biopsy of the Bone °A bone biopsy is a procedure in which a small sample of bone is removed. The sample is taken with a needle. Then, the bone sample is looked at under a microscope to check for abnormalities. The sample is usually taken from a bone that is close to the skin. This procedure may be done to check for various problems with the bone. You may need this procedure if imaging tests or blood tests have indicated a possible problem. This procedure may be done to help determine if a bone tumor is cancerous (malignant). A bone biopsy can help to diagnose problems such as: °· Tumors of the bone (sarcomas) and bone marrow (multiple myeloma). °· Bone that forms abnormally (Paget disease). °· Noncancerous (benign) bone cysts. °· Bony growths. °· Infections in the bone. °Tell a health care provider about: °· Any allergies you have. °· All medicines you are taking, including vitamins, herbs, eye drops, creams, and over-the-counter medicines. °· Any problems you or family members have had with anesthetic medicines. °· Any blood disorders you have. °· Any surgeries you have had. °· Any medical conditions you have. °What are the risks? °Generally, this is a safe procedure. However, problems may occur, including: °· Excessive bleeding. °· Infection. °· Injury to surrounding tissue. °What happens before the procedure? °· Ask your health care provider about: °¨ Changing or stopping your regular medicines. This is especially important if you are taking diabetes medicines or blood thinners. °¨ Taking medicines such as aspirin and ibuprofen. These medicines can thin your blood. Do not take these medicines before your procedure if your health care provider instructs you not to. °· Follow instructions from your health care provider about eating or drinking restrictions. °· Plan to have someone take you home after the procedure. °· If you go home right after the procedure, plan to have someone with you for 24 hours. °What  happens during the procedure? °· An IV tube may be inserted into one of your veins. °· The injection site will be cleaned with a germ-killing solution (antiseptic). °· You will be given one or more of the following: °¨ A medicine to help you relax (sedative). °¨ A medicine to numb the area (local anesthetic). °· The sample of bone will be removed by putting a large needle through the skin and into the bone. °· The needle will be removed. °· A bandage (dressing) will be placed over the insertion site and taped in place. °The procedure may vary among health care providers and hospitals. °What happens after the procedure? °· Your blood pressure, heart rate, breathing rate, and blood oxygen level will be monitored often until the medicines you were given have worn off. °· Return to your normal activities as told by your health care provider. °This information is not intended to replace advice given to you by your health care provider. Make sure you discuss any questions you have with your health care provider. °Document Released: 12/21/2003 Document Revised: 07/20/2015 Document Reviewed: 03/21/2014 °Elsevier Interactive Patient Education © 2017 [employer] Inc[employer] ° °

## 2016-07-16 ENCOUNTER — Inpatient Hospital Stay: Payer: 59

## 2016-07-16 DIAGNOSIS — D751 Secondary polycythemia: Secondary | ICD-10-CM | POA: Diagnosis not present

## 2016-07-23 LAB — CHROMOSOME ANALYSIS, BONE MARROW

## 2016-07-30 ENCOUNTER — Inpatient Hospital Stay: Payer: 59 | Attending: Oncology

## 2016-07-30 VITALS — BP 128/73 | HR 64 | Temp 97.0°F | Resp 20

## 2016-07-30 DIAGNOSIS — G8929 Other chronic pain: Secondary | ICD-10-CM | POA: Insufficient documentation

## 2016-07-30 DIAGNOSIS — Z79899 Other long term (current) drug therapy: Secondary | ICD-10-CM | POA: Insufficient documentation

## 2016-07-30 DIAGNOSIS — G4730 Sleep apnea, unspecified: Secondary | ICD-10-CM | POA: Diagnosis not present

## 2016-07-30 DIAGNOSIS — D72829 Elevated white blood cell count, unspecified: Secondary | ICD-10-CM | POA: Diagnosis not present

## 2016-07-30 DIAGNOSIS — Z8701 Personal history of pneumonia (recurrent): Secondary | ICD-10-CM | POA: Diagnosis not present

## 2016-07-30 DIAGNOSIS — D72820 Lymphocytosis (symptomatic): Secondary | ICD-10-CM | POA: Diagnosis not present

## 2016-07-30 DIAGNOSIS — F17210 Nicotine dependence, cigarettes, uncomplicated: Secondary | ICD-10-CM | POA: Insufficient documentation

## 2016-07-30 DIAGNOSIS — D72821 Monocytosis (symptomatic): Secondary | ICD-10-CM | POA: Insufficient documentation

## 2016-07-30 DIAGNOSIS — E669 Obesity, unspecified: Secondary | ICD-10-CM | POA: Insufficient documentation

## 2016-07-30 DIAGNOSIS — M549 Dorsalgia, unspecified: Secondary | ICD-10-CM | POA: Diagnosis not present

## 2016-07-30 DIAGNOSIS — J449 Chronic obstructive pulmonary disease, unspecified: Secondary | ICD-10-CM | POA: Insufficient documentation

## 2016-07-30 DIAGNOSIS — Z86010 Personal history of colonic polyps: Secondary | ICD-10-CM | POA: Insufficient documentation

## 2016-07-30 DIAGNOSIS — K2270 Barrett's esophagus without dysplasia: Secondary | ICD-10-CM | POA: Insufficient documentation

## 2016-07-30 DIAGNOSIS — K219 Gastro-esophageal reflux disease without esophagitis: Secondary | ICD-10-CM | POA: Insufficient documentation

## 2016-07-30 DIAGNOSIS — D751 Secondary polycythemia: Secondary | ICD-10-CM | POA: Diagnosis not present

## 2016-08-05 ENCOUNTER — Encounter (HOSPITAL_COMMUNITY): Payer: Self-pay

## 2016-08-13 ENCOUNTER — Inpatient Hospital Stay (HOSPITAL_BASED_OUTPATIENT_CLINIC_OR_DEPARTMENT_OTHER): Payer: 59 | Admitting: Oncology

## 2016-08-13 ENCOUNTER — Inpatient Hospital Stay: Payer: 59

## 2016-08-13 ENCOUNTER — Encounter: Payer: Self-pay | Admitting: Oncology

## 2016-08-13 VITALS — BP 112/75 | HR 84 | Temp 98.0°F | Resp 20 | Wt 328.4 lb

## 2016-08-13 DIAGNOSIS — Z79899 Other long term (current) drug therapy: Secondary | ICD-10-CM

## 2016-08-13 DIAGNOSIS — F17210 Nicotine dependence, cigarettes, uncomplicated: Secondary | ICD-10-CM

## 2016-08-13 DIAGNOSIS — J449 Chronic obstructive pulmonary disease, unspecified: Secondary | ICD-10-CM

## 2016-08-13 DIAGNOSIS — D72829 Elevated white blood cell count, unspecified: Secondary | ICD-10-CM | POA: Diagnosis not present

## 2016-08-13 DIAGNOSIS — D72820 Lymphocytosis (symptomatic): Secondary | ICD-10-CM | POA: Diagnosis not present

## 2016-08-13 DIAGNOSIS — D72821 Monocytosis (symptomatic): Secondary | ICD-10-CM | POA: Diagnosis not present

## 2016-08-13 DIAGNOSIS — D751 Secondary polycythemia: Secondary | ICD-10-CM

## 2016-08-13 DIAGNOSIS — E669 Obesity, unspecified: Secondary | ICD-10-CM

## 2016-08-13 DIAGNOSIS — Z86010 Personal history of colonic polyps: Secondary | ICD-10-CM

## 2016-08-13 DIAGNOSIS — K2270 Barrett's esophagus without dysplasia: Secondary | ICD-10-CM

## 2016-08-13 DIAGNOSIS — M549 Dorsalgia, unspecified: Secondary | ICD-10-CM

## 2016-08-13 DIAGNOSIS — G8929 Other chronic pain: Secondary | ICD-10-CM

## 2016-08-13 DIAGNOSIS — K219 Gastro-esophageal reflux disease without esophagitis: Secondary | ICD-10-CM

## 2016-08-13 DIAGNOSIS — G4730 Sleep apnea, unspecified: Secondary | ICD-10-CM

## 2016-08-13 DIAGNOSIS — Z8701 Personal history of pneumonia (recurrent): Secondary | ICD-10-CM

## 2016-08-13 NOTE — Progress Notes (Signed)
Here for new pt eval   Pulse ox 96%on RA

## 2016-08-13 NOTE — Progress Notes (Signed)
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 He is been referred to [place] for evaluation and management of polycythemia. Recent CBC from 05/17/2016 showed white count of 12.8, H&H of 19.8/55.5 and platelet count of 177. Looking back at his prior CBCs in 2017 as well as 2016 his H&H was 18.9/55.6 and 18.4/53.2 respectively. LFTs have been within normal limits. Recent urinalysis from March 2018 did not reveal any hematuria. Patient has had JAK2 and exon 12testing in 2016 which was negative. 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 He is trying to cut down. He denies any symptoms of headaches, blurry vision, chest pain or strokelike symptoms. He has chronic back pain.  Blood work from 06/03/16 again showed mild leucocytosis along with elevated hb of 19. Platelet counts normal. EPO level was normal and BCR abl testing was negative for CML  Bone marrow biopsy in May 2018 showed hypercellular marrow with normal trilineage hematopoiesis. No evidence of lymphoma, leukemia or myeloproliferative neoplasm  Interval history- continues to smoke. Hopes to slow down soon but not interested in quitting yet  Review of systems- Review of Systems    Constitutional: Positive for malaise/fatigue. Negative for chills, fever and weight loss.  HENT: Negative for congestion, ear discharge and nosebleeds.   Eyes: Negative for blurred vision.  Respiratory: Negative for cough, hemoptysis, sputum production, shortness of breath and wheezing.   Cardiovascular: Negative for chest pain, palpitations, orthopnea and claudication.  Gastrointestinal: Negative for abdominal pain, blood in stool, constipation, diarrhea, heartburn, melena, nausea and vomiting.  Genitourinary: Negative for dysuria, flank pain, frequency, hematuria and urgency.  Musculoskeletal: Positive for back pain. Negative for joint pain and myalgias.  Skin: Negative for rash.  Neurological: Negative for dizziness, tingling, focal weakness, seizures, weakness and headaches.  Endo/Heme/Allergies: Does not bruise/bleed easily.  Psychiatric/Behavioral: Negative for depression and suicidal ideas. The patient does not have insomnia.        Allergies  Allergen Reactions  . Cephalexin Rash     Past Medical History:  Diagnosis Date  . Adenomatous polyps   . Barrett's esophagus   . Colon polyp   . COPD (chronic obstructive pulmonary disease) ([place])    uses inhaler, "i may have COPD"  . Dyspnea   . Eye injury   . GERD (gastroesophageal reflux disease)   . History of gonorrhea   . History of pneumonia   . Obesity   . Pneumonia   . Sleep apnea      Past Surgical History:  Procedure Laterality Date  . CATARACT EXTRACTION Left   . COLONOSCOPY    . COLONOSCOPY WITH PROPOFOL N/A 05/19/2015   Procedure: COLONOSCOPY WITH PROPOFOL;  Surgeon: [name], MD;  Location: [facility] ENDOSCOPY;  Service: Endoscopy;  Laterality: N/A;  . ESOPHAGOGASTRODUODENOSCOPY (EGD) WITH PROPOFOL N/A 05/19/2015  Procedure: ESOPHAGOGASTRODUODENOSCOPY (EGD) WITH PROPOFOL;  Surgeon: [name], MD;  Location: [facility] ENDOSCOPY;  Service: Endoscopy;  Laterality: N/A;  . EYE SURGERY      Social  History   Social History  . Marital status: Married    Spouse name: N/A  . Number of children: N/A  . Years of education: N/A   Occupational History  . Not on file.   Social History Main Topics  . Smoking status: Current Every Day Smoker    Packs/day: 1.00    Years: 30.00    Types: Cigarettes    Last attempt to quit: 03/04/2015  . Smokeless tobacco: Never Used  . Alcohol use 4.2 oz/week    7 Cans of beer per week  . Drug use: No  . Sexual activity: Not on file   Other Topics Concern  . Not on file   Social History Narrative  . No narrative on file    Family History  Problem Relation Age of Onset  . Emphysema Mother      Current Outpatient Prescriptions:  .  albuterol (PROVENTIL HFA;VENTOLIN HFA) 108 (90 Base) MCG/ACT inhaler, 2 puffs q.i.d. p.r.n. short of breath, wheezing, or cough, Disp: , Rfl:  .  benzonatate (TESSALON) 200 MG capsule, take 1 capsule by mouth three times a day if needed for cough for UP TO 7 DAYS, Disp: , Rfl: 0 .  doxycycline (VIBRAMYCIN) 100 MG capsule, take 1 capsule by mouth twice a day for 10 days, Disp: , Rfl: 0  Physical exam:  Vitals:   08/13/16 1347  BP: 112/75  Pulse: 84  Resp: 20  Temp: 98 F (36.7 C)  TempSrc: Tympanic  Weight: (!) 328 lb 6.4 oz (149 kg)   Physical Exam  Constitutional: He is oriented to person, place, and time.  Obese in no acute distress  HENT:  Head: Normocephalic and atraumatic.  Eyes: EOM are normal. Pupils are equal, round, and reactive to light.  Neck: Normal range of motion.  Cardiovascular: Normal rate, regular rhythm and normal heart sounds.   Pulmonary/Chest: Effort normal and breath sounds normal.  Abdominal: Soft. Bowel sounds are normal.  Neurological: He is alert and oriented to person, place, and time.  Skin: Skin is warm and dry.     CMP Latest Ref Rng & Units 07/09/2016  Glucose 65 - 99 mg/dL 103(H)  BUN 6 - 20 mg/dL 15  Creatinine 0.61 - 1.24 mg/dL 0.81  Sodium 135 - 145 mmol/L 140    Potassium 3.5 - 5.1 mmol/L 3.9  Chloride 101 - 111 mmol/L 109  CO2 22 - 32 mmol/L 24  Calcium 8.9 - 10.3 mg/dL 9.0   CBC Latest Ref Rng & Units 07/09/2016  WBC 3.8 - 10.6 K/uL 10.4  Hemoglobin 13.0 - 18.0 g/dL 17.8  Hematocrit 40.0 - 52.0 % 52.0  Platelets 150 - 440 K/uL 181      Assessment and plan- Patient is a 71 y.o. 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 H&H was 16.8/49.5 and a platelet count of 182. Since his hematocrit was less than 50, I will hold off on phlebotomy today. He will continue to get CBC checked every 2 weeks and if his hematocrit is more than 50 and we will restart phlebotomy at that time. I am keeping a high threshold for phlebotomy greater than 50 hematocrit given that he has secondary polycythemia  and not primary polycythemia. Leukocytosis likely reactive smoking. Continue to monitor  I will see him back in 2 months for possible phlebotomy   Visit Diagnosis 1. Polycythemia      Dr. Randa Evens, MD, MPH [place] at [facility] Pager- [phone] 08/13/2016 1:53 PM

## 2016-08-20 ENCOUNTER — Telehealth: Payer: Self-pay | Admitting: *Deleted

## 2016-08-20 ENCOUNTER — Encounter: Payer: Self-pay | Admitting: Oncology

## 2016-08-20 NOTE — Progress Notes (Signed)
His hct >50. Needs to restart phlebotomy

## 2016-08-20 NOTE — Telephone Encounter (Signed)
Called patient and left message with Arbie Cookey, patient's wife that patient will need a phlebotomy on July 3 @ 3 pm.  Verbalized understanding and will give patient the message.

## 2016-08-20 NOTE — Telephone Encounter (Signed)
-----   Message from Luella Cook, RN sent at 08/20/2016  4:28 PM EDT -----   ----- Message ----- From: Sindy Guadeloupe, MD Sent: 08/20/2016  10:59 AM To: Luella Cook, RN  His hct >50. Needs to restart phlebotomy

## 2016-08-27 ENCOUNTER — Inpatient Hospital Stay: Payer: 59 | Attending: Oncology

## 2016-08-27 DIAGNOSIS — D45 Polycythemia vera: Secondary | ICD-10-CM | POA: Diagnosis not present

## 2016-08-27 DIAGNOSIS — D751 Secondary polycythemia: Secondary | ICD-10-CM | POA: Diagnosis present

## 2016-09-10 ENCOUNTER — Inpatient Hospital Stay: Payer: 59

## 2016-09-10 DIAGNOSIS — D751 Secondary polycythemia: Secondary | ICD-10-CM

## 2016-09-10 DIAGNOSIS — D45 Polycythemia vera: Secondary | ICD-10-CM | POA: Diagnosis not present

## 2016-09-16 ENCOUNTER — Encounter: Payer: Self-pay | Admitting: Oncology

## 2016-09-23 ENCOUNTER — Telehealth: Payer: Self-pay | Admitting: *Deleted

## 2016-09-23 NOTE — Telephone Encounter (Signed)
Called pt to let him know that he does not need phlebotomy tom.  He is agreeable to plan and I went overe his hgb 16.6 and hct 48.5 from lab draw 7/23.

## 2016-09-24 ENCOUNTER — Inpatient Hospital Stay: Payer: 59

## 2016-10-03 ENCOUNTER — Encounter: Payer: Self-pay | Admitting: Oncology

## 2016-10-06 ENCOUNTER — Telehealth: Payer: Self-pay | Admitting: *Deleted

## 2016-10-06 NOTE — Telephone Encounter (Signed)
Pt does not need to come in 8/14 for phlebotomy. His hct less than 50 and therefore no phlebotomy needed. Pt aware.

## 2016-10-08 ENCOUNTER — Inpatient Hospital Stay: Payer: 59

## 2016-10-11 ENCOUNTER — Ambulatory Visit: Payer: 59 | Admitting: Anesthesiology

## 2016-10-11 ENCOUNTER — Encounter
Admission: RE | Disposition: A | Discharge status: Home / Self Care | Payer: Self-pay | Source: Ambulatory Visit | Attending: Gastroenterology

## 2016-10-11 ENCOUNTER — Encounter: Payer: Self-pay | Admitting: *Deleted

## 2016-10-11 ENCOUNTER — Ambulatory Visit
Admission: RE | Admit: 2016-10-11 | Discharge: 2016-10-11 | Disposition: A | Discharge status: Home / Self Care | Payer: 59 | Source: Ambulatory Visit | Attending: Gastroenterology | Admitting: Gastroenterology

## 2016-10-11 DIAGNOSIS — Z8719 Personal history of other diseases of the digestive system: Secondary | ICD-10-CM | POA: Insufficient documentation

## 2016-10-11 DIAGNOSIS — K2980 Duodenitis without bleeding: Secondary | ICD-10-CM | POA: Diagnosis not present

## 2016-10-11 DIAGNOSIS — Z6841 Body Mass Index (BMI) 40.0 and over, adult: Secondary | ICD-10-CM | POA: Insufficient documentation

## 2016-10-11 DIAGNOSIS — F17200 Nicotine dependence, unspecified, uncomplicated: Secondary | ICD-10-CM | POA: Insufficient documentation

## 2016-10-11 DIAGNOSIS — Z09 Encounter for follow-up examination after completed treatment for conditions other than malignant neoplasm: Secondary | ICD-10-CM | POA: Insufficient documentation

## 2016-10-11 DIAGNOSIS — K2950 Unspecified chronic gastritis without bleeding: Secondary | ICD-10-CM | POA: Diagnosis not present

## 2016-10-11 DIAGNOSIS — Z9989 Dependence on other enabling machines and devices: Secondary | ICD-10-CM | POA: Diagnosis not present

## 2016-10-11 DIAGNOSIS — J449 Chronic obstructive pulmonary disease, unspecified: Secondary | ICD-10-CM | POA: Diagnosis not present

## 2016-10-11 DIAGNOSIS — K210 Gastro-esophageal reflux disease with esophagitis: Secondary | ICD-10-CM | POA: Insufficient documentation

## 2016-10-11 DIAGNOSIS — K449 Diaphragmatic hernia without obstruction or gangrene: Secondary | ICD-10-CM | POA: Insufficient documentation

## 2016-10-11 DIAGNOSIS — G4730 Sleep apnea, unspecified: Secondary | ICD-10-CM | POA: Insufficient documentation

## 2016-10-11 HISTORY — PX: ESOPHAGOGASTRODUODENOSCOPY (EGD) WITH PROPOFOL: SHX5813

## 2016-10-11 HISTORY — DX: Gastro-esophageal reflux disease with esophagitis, without bleeding: K21.00

## 2016-10-11 HISTORY — DX: Gastro-esophageal reflux disease with esophagitis: K21.0

## 2016-10-11 SURGERY — ESOPHAGOGASTRODUODENOSCOPY (EGD) WITH PROPOFOL
Anesthesia: General

## 2016-10-11 MED ORDER — SODIUM CHLORIDE 0.9 % IV SOLN: INTRAVENOUS | Status: DC

## 2016-10-11 MED ORDER — GLYCOPYRROLATE 0.2 MG/ML IJ SOLN
INTRAMUSCULAR | Status: AC
  Filled 2016-10-11: qty 1

## 2016-10-11 MED ORDER — PROPOFOL 500 MG/50ML IV EMUL
INTRAVENOUS | Status: DC | PRN
  Administered 2016-10-11: 120 ug/kg/min via INTRAVENOUS

## 2016-10-11 MED ORDER — LIDOCAINE HCL (PF) 2 % IJ SOLN
INTRAMUSCULAR | Status: AC
  Filled 2016-10-11: qty 2

## 2016-10-11 MED ORDER — BUTAMBEN-TETRACAINE-BENZOCAINE 2-2-14 % EX AERO
INHALATION_SPRAY | CUTANEOUS | Status: DC | PRN
  Administered 2016-10-11: 2 via TOPICAL

## 2016-10-11 MED ORDER — FENTANYL CITRATE (PF) 100 MCG/2ML IJ SOLN
INTRAMUSCULAR | Status: AC
  Filled 2016-10-11: qty 2

## 2016-10-11 MED ORDER — PROPOFOL 500 MG/50ML IV EMUL
INTRAVENOUS | Status: AC
  Filled 2016-10-11: qty 50

## 2016-10-11 MED ORDER — FENTANYL CITRATE (PF) 100 MCG/2ML IJ SOLN
INTRAMUSCULAR | Status: DC | PRN
  Administered 2016-10-11: 50 ug via INTRAVENOUS

## 2016-10-11 MED ORDER — MIDAZOLAM HCL 2 MG/2ML IJ SOLN
INTRAMUSCULAR | Status: AC
  Filled 2016-10-11: qty 2

## 2016-10-11 MED ORDER — MIDAZOLAM HCL 2 MG/2ML IJ SOLN
INTRAMUSCULAR | Status: DC | PRN
  Administered 2016-10-11: 2 mg via INTRAVENOUS

## 2016-10-11 MED ORDER — SODIUM CHLORIDE 0.9 % IV SOLN
INTRAVENOUS | Status: DC
  Administered 2016-10-11: 09:00:00 via INTRAVENOUS

## 2016-10-11 NOTE — Anesthesia Preprocedure Evaluation (Addendum)
Anesthesia Evaluation  Patient identified by MRN, date of birth, ID band Patient awake    Reviewed: Allergy & Precautions, NPO status , Patient's Chart, lab work & pertinent test results, reviewed documented beta blocker date and time   Airway Mallampati: III  TM Distance: >3 FB     Dental  (+) Chipped, Partial Lower, Partial Upper   Pulmonary shortness of breath, sleep apnea and Continuous Positive Airway Pressure Ventilation , pneumonia, resolved, COPD, Current Smoker,           Cardiovascular      Neuro/Psych    GI/Hepatic GERD  ,  Endo/Other  Morbid obesity  Renal/GU      Musculoskeletal   Abdominal   Peds  Hematology   Anesthesia Other Findings   Reproductive/Obstetrics                            Anesthesia Physical Anesthesia Plan  ASA: III  Anesthesia Plan: General   Post-op Pain Management:    Induction: Intravenous  PONV Risk Score and Plan:   Airway Management Planned:   Additional Equipment:   Intra-op Plan:   Post-operative Plan:   Informed Consent: I have reviewed the patients History and Physical, chart, labs and discussed the procedure including the risks, benefits and alternatives for the proposed anesthesia with the patient or authorized representative who has indicated his/her understanding and acceptance.     Plan Discussed with: CRNA  Anesthesia Plan Comments:         Anesthesia Quick Evaluation

## 2016-10-11 NOTE — Anesthesia Procedure Notes (Signed)
Performed by: Vaughan Sine Pre-anesthesia Checklist: Patient identified, Emergency Drugs available, Suction available, Patient being monitored and Timeout performed Patient Re-evaluated:Patient Re-evaluated prior to induction Oxygen Delivery Method: Nasal cannula Preoxygenation: Pre-oxygenation with 100% oxygen Induction Type: IV induction Airway Equipment and Method: Bite block Placement Confirmation: positive ETCO2 and CO2 detector

## 2016-10-11 NOTE — H&P (Signed)
Outpatient short stay form Pre-procedure 10/11/2016 9:36 AM [name] MD  Primary Physician: Dr. [place] Pink  Reason for visit:  EGD  History of present illness:  Patient is a 71 year old male presenting today for follow-up of Barrett's esophagus. He has not been taking his proton pump inhibitor regularly. And he does continue to smoke. He denies use of any aspirin or blood thinning agents.    Current Facility-Administered Medications:  .  0.9 %  sodium chloride infusion, , Intravenous, Continuous, [name], MD, Last Rate: 20 mL/hr at 10/11/16 0855 .  0.9 %  sodium chloride infusion, , Intravenous, Continuous, [name], MD .  0.9 %  sodium chloride infusion, , Intravenous, Continuous, [name], MD .  butamben-tetracaine-benzocaine (CETACAINE) spray, , , PRN, [name], MD, 2 spray at 10/11/16 0920  Facility-Administered Medications Ordered in Other Encounters:  .  fentaNYL (SUBLIMAZE) injection, , , Anesthesia Intra-op, Cook-Jordi Lacko, Cindy, 50 mcg at 10/11/16 0814 .  midazolam (VERSED) injection, , , Anesthesia Intra-op, Cook-Lanell Dubie, Cindy, 2 mg at 10/11/16 4818  Prescriptions Prior to Admission  Medication Sig Dispense Refill Last Dose  . albuterol (PROVENTIL HFA;VENTOLIN HFA) 108 (90 Base) MCG/ACT inhaler 2 puffs q.i.d. p.r.n. short of breath, wheezing, or cough   Past Month at Unknown time  . benzonatate (TESSALON) 200 MG capsule take 1 capsule by mouth three times a day if needed for cough for UP TO 7 DAYS  0 Not Taking at Unknown time  . doxycycline (VIBRAMYCIN) 100 MG capsule take 1 capsule by mouth twice a day for 10 days  0 Completed Course at Unknown time     Allergies  Allergen Reactions  . Cephalexin Rash     Past Medical History:  Diagnosis Date  . Adenomatous polyps   . Barrett's esophagus   . Colon polyp   . COPD (chronic obstructive pulmonary disease) ([place])    uses inhaler, "i may have COPD"  . Dyspnea   . Eye  injury   . GERD (gastroesophageal reflux disease)   . History of gonorrhea   . History of pneumonia   . Obesity   . Pneumonia   . Reflux esophagitis   . Sleep apnea     Review of systems:      Physical Exam    Heart and lungs: Regular rate and rhythm without rub or gallop, lungs are bilaterally clear.    HEENT: Normocephalic atraumatic eyes are anicteric    Other:     Pertinant exam for procedure: Soft obese nontender nondistended bowel sounds positive normoactive    Planned proceedures: EGD and indicated procedures. I have discussed the risks benefits and complications of procedures to include not limited to bleeding, infection, perforation and the risk of sedation and the patient wishes to proceed.   [name], MD Gastroenterology 10/11/2016  9:36 AM

## 2016-10-11 NOTE — Op Note (Signed)
[facility] Gastroenterology Patient Name: David Johnson Procedure Date: 10/11/2016 9:22 AM MRN: [id number] Account #: [id number] Date of Birth: April 29, 1945 Admit Type: Outpatient Age: 71 Room: [facility] ENDO ROOM 3 Gender: Male Note Status: Finalized Procedure:            Upper GI endoscopy Indications:          Follow-up of Barrett's esophagus Providers:            [name], MD Referring MD:         Irven Easterly. Kary Kos, MD (Referring MD) Medicines:            Monitored Anesthesia Care Complications:        No immediate complications. Procedure:            Pre-Anesthesia Assessment:                       - ASA Grade Assessment: III - A patient with severe                        systemic disease.                       After obtaining informed consent, the endoscope was                        passed under direct vision. Throughout the procedure,                        the patient's blood pressure, pulse, and oxygen                        saturations were monitored continuously. The Endoscope                        was introduced through the mouth, and advanced to the                        third part of duodenum. The upper GI endoscopy was                        unusually difficult due to the patient's body habitus                        and the patient's oxygen desaturation. Successful                        completion of the procedure was aided by performing                        chin lift and placement of nasal airway. The patient                        tolerated the procedure. Findings:      LA Grade C (one or more mucosal breaks continuous between tops of 2 or       more mucosal folds, less than 75% circumference) esophagitis with no       bleeding was found.      There were esophageal mucosal changes suggestive of short-segment       Barrett's esophagus present at the gastroesophageal junction. The  maximum longitudinal extent of these mucosal changes  was 1 cm in length.       Mucosa was biopsied with a cold forceps for histology in 4 quadrants.       One specimen bottle was sent to pathology.      A small hiatal hernia was found. The Z-line was a variable distance from       incisors; the hiatal hernia was sliding.      The exam of the esophagus was otherwise normal.      Diffuse moderate inflammation characterized by congestion (edema),       erosions, erythema and friability was found in the gastric body and in       the gastric antrum. Biopsies were taken with a cold forceps for       histology. Biopsies were taken with a cold forceps for Helicobacter       pylori testing.      The cardia and gastric fundus were normal on retroflexion.      A single 12 mm submucosal papule (nodule) with no bleeding and no       stigmata of recent bleeding was found on the posterior wall of the       gastric antrum. Biopsies were taken with a cold forceps for histology.       Note positive pillow sign.      Diffuse and patchy moderate inflammation characterized by congestion       (edema), erythema, friability and granularity was found in the duodenal       bulb and in the second portion of the duodenum. Biopsies were taken with       a cold forceps for histology. Impression:           - LA Grade C esophagitis.                       - Esophageal mucosal changes suggestive of                        short-segment Barrett's esophagus. Biopsied.                       - Small hiatal hernia. Recommendation:       - Discharge patient to home.                       - Use Protonix (pantoprazole) 40 mg PO BID for 1 month.                       - Use Protonix (pantoprazole) 40 mg PO daily daily.                       - Return to GI clinic in 1 month. Procedure Code(s):    --- Professional ---                       [phone], Esophagogastroduodenoscopy, flexible, transoral;                        with biopsy, single or multiple Diagnosis Code(s):    ---  Professional ---                       K20.9, Esophagitis, unspecified  K22.70, Barrett's esophagus without dysplasia                       K44.9, Diaphragmatic hernia without obstruction or                        gangrene CPT copyright 2016 American Medical Association. All rights reserved. The codes documented in this report are preliminary and upon coder review may  be revised to meet current compliance requirements. [name], MD 10/11/2016 10:09:10 AM This report has been signed electronically. Number of Addenda: 0 Note Initiated On: 10/11/2016 9:22 AM      [facility]

## 2016-10-11 NOTE — Anesthesia Post-op Follow-up Note (Signed)
Anesthesia QCDR form completed.        

## 2016-10-11 NOTE — Anesthesia Postprocedure Evaluation (Signed)
Anesthesia Post Note  Patient: HADLEY DETLOFF  Procedure(s) Performed: Procedure(s) (LRB): ESOPHAGOGASTRODUODENOSCOPY (EGD) WITH PROPOFOL (N/A)  Patient location during evaluation: Endoscopy Anesthesia Type: General Level of consciousness: awake and alert Pain management: pain level controlled Vital Signs Assessment: post-procedure vital signs reviewed and stable Respiratory status: spontaneous breathing, nonlabored ventilation, respiratory function stable and patient connected to nasal cannula oxygen Cardiovascular status: blood pressure returned to baseline and stable Postop Assessment: no signs of nausea or vomiting Anesthetic complications: no     Last Vitals:  Vitals:   10/11/16 1028 10/11/16 1038  BP: 126/64 134/70  Pulse: (!) 57 (!) 58  Resp: 16 15  Temp:    SpO2: 98% 100%    Last Pain:  Vitals:   10/11/16 1008  TempSrc: Tympanic                 Blondina Coderre S

## 2016-10-11 NOTE — Transfer of Care (Signed)
Immediate Anesthesia Transfer of Care Note  Patient: David Johnson  Procedure(s) Performed: Procedure(s): ESOPHAGOGASTRODUODENOSCOPY (EGD) WITH PROPOFOL (N/A)  Patient Location: PACU  Anesthesia Type:General  Level of Consciousness: awake and sedated  Airway & Oxygen Therapy: Patient Spontanous Breathing and Patient connected to nasal cannula oxygen  Post-op Assessment: Report given to RN and Post -op Vital signs reviewed and stable  Post vital signs: Reviewed and stable  Last Vitals:  Vitals:   10/11/16 0842  BP: 139/82  Pulse: 60  Resp: 20  Temp: 37.3 C  SpO2: 99%    Last Pain:  Vitals:   10/11/16 0842  TempSrc: Tympanic         Complications: No apparent anesthesia complications

## 2016-10-12 NOTE — Progress Notes (Signed)
Protonix prescription was not called in to [facility].  Patient instructed to call [facility] GI fist thing  Monday morning.

## 2016-10-14 ENCOUNTER — Encounter: Payer: Self-pay | Admitting: Gastroenterology

## 2016-10-16 LAB — SURGICAL PATHOLOGY

## 2016-10-22 ENCOUNTER — Inpatient Hospital Stay: Payer: 59 | Attending: Oncology | Admitting: Oncology

## 2016-10-22 ENCOUNTER — Inpatient Hospital Stay: Payer: 59

## 2016-10-22 ENCOUNTER — Encounter: Payer: Self-pay | Admitting: Oncology

## 2016-10-22 VITALS — BP 155/70 | HR 69 | Temp 98.1°F | Resp 20 | Ht 72.0 in | Wt 338.4 lb

## 2016-10-22 DIAGNOSIS — F17210 Nicotine dependence, cigarettes, uncomplicated: Secondary | ICD-10-CM | POA: Diagnosis not present

## 2016-10-22 DIAGNOSIS — D751 Secondary polycythemia: Secondary | ICD-10-CM | POA: Diagnosis not present

## 2016-10-22 DIAGNOSIS — Z8701 Personal history of pneumonia (recurrent): Secondary | ICD-10-CM

## 2016-10-22 DIAGNOSIS — G8929 Other chronic pain: Secondary | ICD-10-CM | POA: Diagnosis not present

## 2016-10-22 DIAGNOSIS — G4730 Sleep apnea, unspecified: Secondary | ICD-10-CM | POA: Diagnosis not present

## 2016-10-22 DIAGNOSIS — K2270 Barrett's esophagus without dysplasia: Secondary | ICD-10-CM

## 2016-10-22 DIAGNOSIS — Z8719 Personal history of other diseases of the digestive system: Secondary | ICD-10-CM | POA: Diagnosis not present

## 2016-10-22 DIAGNOSIS — E669 Obesity, unspecified: Secondary | ICD-10-CM

## 2016-10-22 DIAGNOSIS — J449 Chronic obstructive pulmonary disease, unspecified: Secondary | ICD-10-CM

## 2016-10-22 DIAGNOSIS — M549 Dorsalgia, unspecified: Secondary | ICD-10-CM | POA: Diagnosis not present

## 2016-10-22 DIAGNOSIS — Z86010 Personal history of colonic polyps: Secondary | ICD-10-CM

## 2016-10-22 DIAGNOSIS — D72829 Elevated white blood cell count, unspecified: Secondary | ICD-10-CM | POA: Diagnosis not present

## 2016-10-22 DIAGNOSIS — K219 Gastro-esophageal reflux disease without esophagitis: Secondary | ICD-10-CM | POA: Diagnosis not present

## 2016-10-22 DIAGNOSIS — R0602 Shortness of breath: Secondary | ICD-10-CM | POA: Diagnosis not present

## 2016-10-22 NOTE — Progress Notes (Signed)
Patient here for follow up. He has recently added protonix for indegestion.

## 2016-10-23 NOTE — Progress Notes (Signed)
   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 He is been referred to us for evaluation and management of polycythemia. Recent CBC from 05/17/2016 showed white count of 12.8, H&H of 19.8/55.5 and platelet count of 177. Looking back at his prior CBCs in 2017 as well as 2016 his H&H was 18.9/55.6 and 18.4/53.2 respectively. LFTs have been within normal limits. Recent urinalysis from March 2018 did not reveal any hematuria. Patient has had JAK2 and exon 12testing in 2016 which was negative. 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 He is trying to cut down. He denies any symptoms of headaches, blurry vision, chest pain or strokelike symptoms. He has chronic back pain.  Blood work from 06/03/16 again showed mild leucocytosis along with elevated hb of 19. Platelet counts normal. EPO level was normal and BCR abl testing was negative for CML  Bone marrow biopsy in May 2018 showed hypercellular marrow with normal trilineage hematopoiesis. No evidence of lymphoma, leukemia or myeloproliferative neoplasm  Interval history- he is now using a different CPAP and reports better night time sleep. Denies any complaints. He is trying to cut down on his  smoking  ECOG PS- 1 Pain scale- 4  Review of systems- Review of Systems  Constitutional: Positive for malaise/fatigue. Negative for chills, fever and weight loss.  HENT: Negative for congestion, ear discharge and nosebleeds.   Eyes: Negative for blurred vision.  Respiratory: Negative for cough, hemoptysis, sputum production, shortness of breath and wheezing.   Cardiovascular: Negative for chest pain, palpitations, orthopnea and claudication.  Gastrointestinal: Negative for abdominal pain, blood in stool, constipation, diarrhea, heartburn, melena, nausea and vomiting.  Genitourinary: Negative for dysuria, flank pain, frequency, hematuria and urgency.  Musculoskeletal: Negative for back pain, joint pain and myalgias.  Skin: Negative for rash.  Neurological: Negative for dizziness, tingling, focal weakness, seizures, weakness and headaches.  Endo/Heme/Allergies: Does not bruise/bleed easily.  Psychiatric/Behavioral: Negative for depression and suicidal ideas. The patient does not have insomnia.      Allergies  Allergen Reactions  . Cephalexin Rash     Past Medical History:  Diagnosis Date  . Adenomatous polyps   . Barrett's esophagus   . Colon polyp   . COPD (chronic obstructive pulmonary disease) (HCC)    uses inhaler, "i may have COPD"  . Dyspnea   . Eye injury   . GERD (gastroesophageal reflux disease)   . History of gonorrhea   . History of pneumonia   . Obesity   . Pneumonia   . Reflux esophagitis   . Sleep apnea      Past Surgical History:  Procedure Laterality Date  . CATARACT EXTRACTION Left   . COLONOSCOPY    . COLONOSCOPY WITH PROPOFOL N/A 05/19/2015   Procedure: COLONOSCOPY WITH PROPOFOL;  Surgeon: Martin U Skulskie, MD;    Location: [facility] ENDOSCOPY;  Service: Endoscopy;  Laterality: N/A;  . ESOPHAGOGASTRODUODENOSCOPY (EGD) WITH PROPOFOL N/A 05/19/2015   Procedure: ESOPHAGOGASTRODUODENOSCOPY (EGD) WITH PROPOFOL;  Surgeon: [name], MD;  Location: [facility]  ENDOSCOPY;  Service: Endoscopy;  Laterality: N/A;  . ESOPHAGOGASTRODUODENOSCOPY (EGD) WITH PROPOFOL N/A 10/11/2016   Procedure: ESOPHAGOGASTRODUODENOSCOPY (EGD) WITH PROPOFOL;  Surgeon: [name], MD;  Location: [facility] ENDOSCOPY;  Service: Endoscopy;  Laterality: N/A;  . EYE SURGERY      Social History   Social History  . Marital status: Married    Spouse name: N/A  . Number of children: N/A  . Years of education: N/A   Occupational History  . Not on file.   Social History Main Topics  . Smoking status: Current Every Day Smoker    Packs/day: 1.00    Years: 30.00    Types: Cigarettes    Last attempt to quit: 03/04/2015  . Smokeless tobacco: Never Used  . Alcohol use 4.2 oz/week    7 Cans of beer per week  . Drug use: No  . Sexual activity: Not on file   Other Topics Concern  . Not on file   Social History Narrative  . No narrative on file    Family History  Problem Relation Age of Onset  . Emphysema Mother      Current Outpatient Prescriptions:  .  albuterol (PROVENTIL HFA;VENTOLIN HFA) 108 (90 Base) MCG/ACT inhaler, 2 puffs q.i.d. p.r.n. short of breath, wheezing, or cough, Disp: , Rfl:  .  pantoprazole (PROTONIX) 40 MG tablet, Take by mouth., Disp: , Rfl:   Physical exam:  Vitals:   10/22/16 1431  BP: (!) 155/70  Pulse: 69  Resp: 20  Temp: 98.1 F (36.7 C)  TempSrc: Tympanic  Weight: (!) 338 lb 6.4 oz (153.5 kg)  Height: 6' (1.829 m)   Physical Exam  Constitutional: He is oriented to person, place, and time.  Obese, does not appear to be in acute distress  HENT:  Head: Normocephalic and atraumatic.  Eyes: Pupils are equal, round, and reactive to light. EOM are normal.  Neck: Normal range of motion.  Cardiovascular: Normal rate, regular rhythm and normal heart sounds.   Pulmonary/Chest: Effort normal and breath sounds normal.  Abdominal: Soft. Bowel sounds are normal.  obese  Neurological: He is alert and oriented to person, place, and time.    Skin: Skin is warm and dry.     CMP Latest Ref Rng & Units 07/09/2016  Glucose 65 - 99 mg/dL 103(H)  BUN 6 - 20 mg/dL 15  Creatinine 0.61 - 1.24 mg/dL 0.81  Sodium 135 - 145 mmol/L 140  Potassium 3.5 - 5.1 mmol/L 3.9  Chloride 101 - 111 mmol/L 109  CO2 22 - 32 mmol/L 24  Calcium 8.9 - 10.3 mg/dL 9.0   CBC Latest Ref Rng & Units 07/09/2016  WBC 3.8 - 10.6 K/uL 10.4  Hemoglobin 13.0 - 18.0 g/dL 17.8  Hematocrit 40.0 - 52.0 % 52.0  Platelets 150 - 440 K/uL 181      Assessment and plan- Patient is a 71 y.o. 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 Hold off on phlebotomy at this time. Will consider phlebotomy only if hematocrit is >50 since we primary polycythemia ruled out with BM biopsy. Repeat cbc in 1 and 2 months and he will see me in 3 months with cbc prior for possible phlebtomy if hct > 50   Visit Diagnosis  1. Polycythemia, secondary      Dr. Randa Evens, MD, MPH [facility] Pager- [phone] 10/23/2016 9:33 PM

## 2016-11-18 ENCOUNTER — Telehealth: Payer: Self-pay | Admitting: *Deleted

## 2016-11-18 ENCOUNTER — Encounter: Payer: Self-pay | Admitting: Oncology

## 2016-11-18 NOTE — Telephone Encounter (Signed)
Called and spoke to wife and told her that he does not need to come for phlebotomy tom.  His hct was 49 and as long as he stays uder 51 he does not need phlebotomy.  I will call again once his next blood work comes back and let him know if he will need to come in the future.  But no phlebotomy tom. 9/25.  She will let pt know

## 2016-11-19 ENCOUNTER — Inpatient Hospital Stay: Payer: 59

## 2016-12-17 ENCOUNTER — Other Ambulatory Visit: Payer: Self-pay | Admitting: Oncology

## 2016-12-17 ENCOUNTER — Inpatient Hospital Stay: Payer: 59 | Attending: Oncology

## 2016-12-18 ENCOUNTER — Encounter: Payer: Self-pay | Admitting: Oncology

## 2016-12-27 ENCOUNTER — Encounter: Payer: Self-pay | Admitting: Family Medicine

## 2016-12-28 ENCOUNTER — Encounter: Payer: Self-pay | Admitting: Oncology

## 2017-01-14 ENCOUNTER — Inpatient Hospital Stay: Payer: 59

## 2017-01-14 ENCOUNTER — Inpatient Hospital Stay: Payer: 59 | Attending: Oncology | Admitting: Oncology

## 2017-01-14 NOTE — Progress Notes (Deleted)
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 He is been referred to [place] for evaluation and management of polycythemia. Recent CBC from 05/17/2016 showed white count of 12.8, H&H of 19.8/55.5 and platelet count of 177. Looking back at his prior CBCs in 2017 as well as 2016 his H&H was 18.9/55.6 and 18.4/53.2 respectively. LFTs have been within normal limits. Recent urinalysis from March 2018 did not reveal any hematuria. Patient has had JAK2 and exon 12testing in 2016 which was negative. 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 He is trying to cut down. He denies any symptoms of headaches, blurry vision, chest pain or strokelike symptoms. He has chronic back pain.  Blood work from 06/03/16 again showed mild leucocytosis along with elevated hb of 19. Platelet counts normal. EPO level was normal and BCR abl testing was negative for CML  Bone marrow biopsy in May 2018 showed hypercellular marrow with normal trilineage hematopoiesis. No evidence of lymphoma, leukemia or myeloproliferative neoplasm     Interval history- ***  ECOG PS- *** Pain scale- *** Opioid associated constipation- ***  Review of systems- Review  of Systems  Constitutional: Negative for chills, fever, malaise/fatigue and weight loss.  HENT: Negative for congestion, ear discharge and nosebleeds.   Eyes: Negative for blurred vision.  Respiratory: Negative for cough, hemoptysis, sputum production, shortness of breath and wheezing.   Cardiovascular: Negative for chest pain, palpitations, orthopnea and claudication.  Gastrointestinal: Negative for abdominal pain, blood in stool, constipation, diarrhea, heartburn, melena, nausea and vomiting.  Genitourinary: Negative for dysuria, flank pain, frequency, hematuria and urgency.  Musculoskeletal: Negative for back pain, joint pain and myalgias.  Skin: Negative for rash.  Neurological: Negative for dizziness, tingling, focal weakness, seizures, weakness and headaches.  Endo/Heme/Allergies: Does not bruise/bleed easily.  Psychiatric/Behavioral: Negative for depression and suicidal ideas. The patient does not have insomnia.       Allergies  Allergen Reactions  . Cephalexin Rash     Past Medical History:  Diagnosis Date  . Adenomatous polyps   . Barrett's esophagus   . Colon polyp   . COPD (chronic obstructive pulmonary disease) ([place])    uses inhaler, "i may have COPD"  . Dyspnea   . Eye injury   . GERD (gastroesophageal reflux disease)   . History of gonorrhea   . History of pneumonia   . Obesity   . Pneumonia   . Reflux esophagitis   . Sleep apnea      Past Surgical History:  Procedure Laterality Date  . CATARACT EXTRACTION Left   . COLONOSCOPY    . COLONOSCOPY WITH PROPOFOL N/A 05/19/2015   Performed by [name], MD at [place]  . ESOPHAGOGASTRODUODENOSCOPY (EGD) WITH PROPOFOL N/A 10/11/2016   Performed by [name], MD at [facility]  ENDOSCOPY  . ESOPHAGOGASTRODUODENOSCOPY (EGD) WITH PROPOFOL N/A 05/19/2015   Performed by [name], MD at [place]  . EYE SURGERY      Social History   Socioeconomic History  . Marital status: Married      Spouse name: Not on file  . Number of children: Not on file  . Years of education: Not on file  . Highest education level: Not on file  Social Needs  . Financial resource strain: Not on file  . Food insecurity - worry: Not on file  . Food insecurity - inability: Not on file  . Transportation needs - medical: Not on file  . Transportation needs - non-medical: Not on file  Occupational History  . Not on file  Tobacco Use  . Smoking status: Current Every Day Smoker    Packs/day: 1.00    Years: 30.00    Pack years: 30.00    Types: Cigarettes    Last attempt to quit: 03/04/2015    Years since quitting: 1.8  . Smokeless tobacco: Never Used  Substance and Sexual Activity  . Alcohol use: Yes    Alcohol/week: 4.2 oz    Types: 7 Cans of beer per week  . Drug use: No  . Sexual activity: Not on file  Other Topics Concern  . Not on file  Social History Narrative  . Not on file    Family History  Problem Relation Age of Onset  . Emphysema Mother      Current Outpatient Medications:  .  albuterol (PROVENTIL HFA;VENTOLIN HFA) 108 (90 Base) MCG/ACT inhaler, 2 puffs q.i.d. p.r.n. short of breath, wheezing, or cough, Disp: , Rfl:  .  pantoprazole (PROTONIX) 40 MG tablet, Take by mouth., Disp: , Rfl:   Physical exam: There were no vitals filed for this visit. Physical Exam  Constitutional: He is oriented to person, place, and time and well-developed, well-nourished, and in no distress.  HENT:  Head: Normocephalic and atraumatic.  Eyes: EOM are normal. Pupils are equal, round, and reactive to light.  Neck: Normal range of motion.  Cardiovascular: Normal rate, regular rhythm and normal heart sounds.  Pulmonary/Chest: Effort normal and breath sounds normal.  Abdominal: Soft. Bowel sounds are normal.  Neurological: He is alert and oriented to person, place, and time.  Skin: Skin is warm and dry.     CMP Latest Ref Rng & Units 07/09/2016  Glucose 65 - 99 mg/dL 103(H)  BUN 6 - 20  mg/dL 15  Creatinine 0.61 - 1.24 mg/dL 0.81  Sodium 135 - 145 mmol/L 140  Potassium 3.5 - 5.1 mmol/L 3.9  Chloride 101 - 111 mmol/L 109  CO2 22 - 32 mmol/L 24  Calcium 8.9 - 10.3 mg/dL 9.0   CBC Latest Ref Rng & Units 07/09/2016  WBC 3.8 - 10.6 K/uL 10.4  Hemoglobin 13.0 - 18.0 g/dL 17.8  Hematocrit 40.0 - 52.0 % 52.0  Platelets 150 - 440 K/uL 181    Assessment and plan- Patient is a 71 y.o. 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 Polycythemia, secondary      Dr. Randa Evens, MD, MPH [facility] Pager- [phone] 01/14/2017 8:31 AM

## 2017-10-02 ENCOUNTER — Encounter (INDEPENDENT_AMBULATORY_CARE_PROVIDER_SITE_OTHER): Payer: 59

## 2017-10-02 ENCOUNTER — Ambulatory Visit (INDEPENDENT_AMBULATORY_CARE_PROVIDER_SITE_OTHER): Payer: 59 | Admitting: Vascular Surgery

## 2017-10-13 ENCOUNTER — Ambulatory Visit (INDEPENDENT_AMBULATORY_CARE_PROVIDER_SITE_OTHER): Payer: 59 | Admitting: Vascular Surgery

## 2017-10-13 ENCOUNTER — Other Ambulatory Visit (INDEPENDENT_AMBULATORY_CARE_PROVIDER_SITE_OTHER): Payer: Self-pay | Admitting: Vascular Surgery

## 2017-10-13 ENCOUNTER — Encounter (INDEPENDENT_AMBULATORY_CARE_PROVIDER_SITE_OTHER): Payer: 59

## 2017-10-13 DIAGNOSIS — M79605 Pain in left leg: Principal | ICD-10-CM

## 2017-10-13 DIAGNOSIS — F17200 Nicotine dependence, unspecified, uncomplicated: Secondary | ICD-10-CM

## 2017-10-13 DIAGNOSIS — M79604 Pain in right leg: Secondary | ICD-10-CM

## 2017-11-24 ENCOUNTER — Ambulatory Visit (INDEPENDENT_AMBULATORY_CARE_PROVIDER_SITE_OTHER): Payer: 59

## 2017-11-24 ENCOUNTER — Ambulatory Visit (INDEPENDENT_AMBULATORY_CARE_PROVIDER_SITE_OTHER): Payer: 59 | Admitting: Vascular Surgery

## 2017-11-24 ENCOUNTER — Encounter (INDEPENDENT_AMBULATORY_CARE_PROVIDER_SITE_OTHER): Payer: Self-pay | Admitting: Vascular Surgery

## 2017-11-24 VITALS — BP 147/75 | HR 57 | Resp 20 | Ht 71.5 in | Wt 323.0 lb

## 2017-11-24 DIAGNOSIS — F17210 Nicotine dependence, cigarettes, uncomplicated: Secondary | ICD-10-CM

## 2017-11-24 DIAGNOSIS — M79605 Pain in left leg: Secondary | ICD-10-CM

## 2017-11-24 DIAGNOSIS — M79604 Pain in right leg: Secondary | ICD-10-CM

## 2017-11-24 DIAGNOSIS — F17200 Nicotine dependence, unspecified, uncomplicated: Secondary | ICD-10-CM

## 2017-11-24 DIAGNOSIS — I739 Peripheral vascular disease, unspecified: Secondary | ICD-10-CM

## 2017-11-24 DIAGNOSIS — G4730 Sleep apnea, unspecified: Secondary | ICD-10-CM | POA: Diagnosis not present

## 2017-11-24 DIAGNOSIS — K219 Gastro-esophageal reflux disease without esophagitis: Secondary | ICD-10-CM | POA: Diagnosis not present

## 2017-11-24 NOTE — Progress Notes (Signed)
MRN : [id number]  David Johnson is a 72 y.o. ([birth year]) male who presents with chief complaint of  Chief Complaint  Patient presents with  . Follow-up    2 year abi  .  History of Present Illness:   The patient returns to the office for followup and review of the noninvasive studies. There have been no interval changes in lower extremity symptoms. No interval shortening of the patient's claudication distance or development of rest pain symptoms. No new ulcers or wounds have occurred since the last visit.  There have been no significant changes to the patient's overall health care.  The patient denies amaurosis fugax or recent TIA symptoms. There are no recent neurological changes noted. The patient denies history of DVT, PE or superficial thrombophlebitis. The patient denies recent episodes of angina or shortness of breath.     No outpatient medications have been marked as taking for the 11/24/17 encounter (Office Visit) with Delana Meyer, Dolores Lory, MD.    Past Medical History:  Diagnosis Date  . Adenomatous polyps   . Barrett's esophagus   . Colon polyp   . COPD (chronic obstructive pulmonary disease) ([place])    uses inhaler, "i may have COPD"  . Dyspnea   . Eye injury   . GERD (gastroesophageal reflux disease)   . History of gonorrhea   . History of pneumonia   . Obesity   . Pneumonia   . Reflux esophagitis   . Sleep apnea     Past Surgical History:  Procedure Laterality Date  . CATARACT EXTRACTION Left   . COLONOSCOPY    . COLONOSCOPY WITH PROPOFOL N/A 05/19/2015   Procedure: COLONOSCOPY WITH PROPOFOL;  Surgeon: [name], MD;  Location: [facility] ENDOSCOPY;  Service: Endoscopy;  Laterality: N/A;  . ESOPHAGOGASTRODUODENOSCOPY (EGD) WITH PROPOFOL N/A 05/19/2015   Procedure: ESOPHAGOGASTRODUODENOSCOPY (EGD) WITH PROPOFOL;  Surgeon: [name], MD;  Location: [facility] ENDOSCOPY;  Service: Endoscopy;  Laterality: N/A;  . ESOPHAGOGASTRODUODENOSCOPY (EGD) WITH  PROPOFOL N/A 10/11/2016   Procedure: ESOPHAGOGASTRODUODENOSCOPY (EGD) WITH PROPOFOL;  Surgeon: [name], MD;  Location: [facility] ENDOSCOPY;  Service: Endoscopy;  Laterality: N/A;  . EYE SURGERY      Social History Social History   Tobacco Use  . Smoking status: Current Every Day Smoker    Packs/day: 1.00    Years: 30.00    Pack years: 30.00    Types: Cigarettes    Last attempt to quit: 03/04/2015    Years since quitting: 2.7  . Smokeless tobacco: Never Used  Substance Use Topics  . Alcohol use: Yes    Alcohol/week: 7.0 standard drinks    Types: 7 Cans of beer per week  . Drug use: No    Family History Family History  Problem Relation Age of Onset  . Emphysema Mother     Allergies  Allergen Reactions  . Cephalexin Rash     REVIEW OF SYSTEMS (Negative unless checked)  Constitutional: [] Weight loss  [] Fever  [] Chills Cardiac: [] Chest pain   [] Chest pressure   [] Palpitations   [] Shortness of breath when laying flat   [] Shortness of breath with exertion. Vascular:  [x] Pain in legs with walking   [] Pain in legs at rest  [] History of DVT   [] Phlebitis   [] Swelling in legs   [] Varicose veins   [] Non-healing ulcers Pulmonary:   [] Uses home oxygen   [] Productive cough   [] Hemoptysis   [] Wheeze  [x] COPD   [] Asthma Neurologic:  [] Dizziness   [] Seizures   []   History of stroke   [] History of TIA  [] Aphasia   [] Vissual changes   [] Weakness or numbness in arm   [] Weakness or numbness in leg Musculoskeletal:   [] Joint swelling   [] Joint pain   [] Low back pain Hematologic:  [] Easy bruising  [] Easy bleeding   [] Hypercoagulable state   [] Anemic Gastrointestinal:  [] Diarrhea   [] Vomiting  [] Gastroesophageal reflux/heartburn   [] Difficulty swallowing. Genitourinary:  [] Chronic kidney disease   [] Difficult urination  [] Frequent urination   [] Blood in urine Skin:  [] Rashes   [] Ulcers  Psychological:  [] History of anxiety   []  History of major depression.  Physical Examination  Vitals:     11/24/17 1440  BP: (!) 147/75  Pulse: (!) 57  Resp: 20  Weight: (!) 323 lb (146.5 kg)  Height: 5' 11.5" (1.816 m)   Body mass index is 44.42 kg/m. Gen: WD/WN, NAD Head: [place]/AT, No temporalis wasting.  Ear/Nose/Throat: Hearing grossly intact, nares w/o erythema or drainage Eyes: PER, EOMI, sclera nonicteric.  Neck: Supple, no large masses.   Pulmonary:  Good air movement, no audible wheezing bilaterally, no use of accessory muscles.  Cardiac: RRR, no JVD Vascular:  Vessel Right Left  Radial Palpable Palpable  PT Not Palpable Not Palpable  DP Not Palpable Not Palpable  Gastrointestinal: Non-distended. No guarding/no peritoneal signs.  Musculoskeletal: M/S 5/5 throughout.  No deformity or atrophy.  Neurologic: CN 2-12 intact. Symmetrical.  Speech is fluent. Motor exam as listed above. Psychiatric: Judgment intact, Mood & affect appropriate for pt's clinical situation. Dermatologic: No rashes or ulcers noted.  No changes consistent with cellulitis. Lymph : No lichenification or skin changes of chronic lymphedema.  CBC Lab Results  Component Value Date   WBC 10.4 07/09/2016   HGB 17.8 07/09/2016   HCT 52.0 07/09/2016   MCV 92.6 07/09/2016   PLT 181 07/09/2016    BMET    Component Value Date/Time   NA 140 07/09/2016 0736   K 3.9 07/09/2016 0736   CL 109 07/09/2016 0736   CO2 24 07/09/2016 0736   GLUCOSE 103 (H) 07/09/2016 0736   BUN 15 07/09/2016 0736   CREATININE 0.81 07/09/2016 0736   CALCIUM 9.0 07/09/2016 0736   GFRNONAA >60 07/09/2016 0736   GFRAA >60 07/09/2016 0736   CrCl cannot be calculated (Patient's most recent lab result is older than the maximum 21 days allowed.).  COAG Lab Results  Component Value Date   INR 0.99 07/09/2016    Radiology No results found.   Assessment/Plan 1. PAD (peripheral artery disease) (HCC) Recommend:  I do not find evidence of life style limiting vascular disease. The patient specifically denies life style  limitation.  Previous noninvasive studies including ABI's of the legs do not identify critical vascular problems.  The patient should continue walking and begin a more formal exercise program. The patient should continue his antiplatelet therapy and aggressive treatment of the lipid abnormalities.  The patient should begin wearing graduated compression socks 15-20 mmHg strength to control her mild edema.  Patient will follow-up with me on a PRN basis   2. Gastroesophageal reflux disease, esophagitis presence not specified Continue PPI as already ordered, this medication has been reviewed and there are no changes at this time.  Avoidence of caffeine and alcohol  Moderate elevation of the head of the bed   3. Sleep apnea, unspecified type Continue cpap as ordered and reviewed, no changes at this time     Hortencia Pilar, MD  11/24/2017 2:50 PM

## 2017-11-25 ENCOUNTER — Encounter (INDEPENDENT_AMBULATORY_CARE_PROVIDER_SITE_OTHER): Payer: Self-pay | Admitting: Vascular Surgery

## 2017-12-31 ENCOUNTER — Encounter: Payer: Self-pay | Admitting: *Deleted

## 2018-01-01 ENCOUNTER — Ambulatory Visit: Payer: 59 | Admitting: Anesthesiology

## 2018-01-01 ENCOUNTER — Encounter: Payer: Self-pay | Admitting: *Deleted

## 2018-01-01 ENCOUNTER — Encounter
Admission: RE | Disposition: A | Discharge status: Home / Self Care | Payer: Self-pay | Source: Ambulatory Visit | Attending: Gastroenterology

## 2018-01-01 ENCOUNTER — Ambulatory Visit
Admission: RE | Admit: 2018-01-01 | Discharge: 2018-01-01 | Disposition: A | Discharge status: Home / Self Care | Payer: 59 | Source: Ambulatory Visit | Attending: Gastroenterology | Admitting: Gastroenterology

## 2018-01-01 DIAGNOSIS — K2210 Ulcer of esophagus without bleeding: Secondary | ICD-10-CM | POA: Diagnosis not present

## 2018-01-01 DIAGNOSIS — Z79899 Other long term (current) drug therapy: Secondary | ICD-10-CM | POA: Diagnosis not present

## 2018-01-01 DIAGNOSIS — J449 Chronic obstructive pulmonary disease, unspecified: Secondary | ICD-10-CM | POA: Insufficient documentation

## 2018-01-01 DIAGNOSIS — G4730 Sleep apnea, unspecified: Secondary | ICD-10-CM | POA: Diagnosis not present

## 2018-01-01 DIAGNOSIS — K2270 Barrett's esophagus without dysplasia: Secondary | ICD-10-CM | POA: Diagnosis present

## 2018-01-01 DIAGNOSIS — K210 Gastro-esophageal reflux disease with esophagitis: Secondary | ICD-10-CM | POA: Insufficient documentation

## 2018-01-01 DIAGNOSIS — K317 Polyp of stomach and duodenum: Secondary | ICD-10-CM | POA: Insufficient documentation

## 2018-01-01 DIAGNOSIS — K2960 Other gastritis without bleeding: Secondary | ICD-10-CM | POA: Insufficient documentation

## 2018-01-01 DIAGNOSIS — Z86010 Personal history of colonic polyps: Secondary | ICD-10-CM | POA: Insufficient documentation

## 2018-01-01 DIAGNOSIS — R0982 Postnasal drip: Secondary | ICD-10-CM | POA: Diagnosis not present

## 2018-01-01 DIAGNOSIS — Z888 Allergy status to other drugs, medicaments and biological substances status: Secondary | ICD-10-CM | POA: Diagnosis not present

## 2018-01-01 DIAGNOSIS — I739 Peripheral vascular disease, unspecified: Secondary | ICD-10-CM | POA: Insufficient documentation

## 2018-01-01 DIAGNOSIS — Z6841 Body Mass Index (BMI) 40.0 and over, adult: Secondary | ICD-10-CM | POA: Diagnosis not present

## 2018-01-01 HISTORY — PX: ESOPHAGOGASTRODUODENOSCOPY (EGD) WITH PROPOFOL: SHX5813

## 2018-01-01 SURGERY — ESOPHAGOGASTRODUODENOSCOPY (EGD) WITH PROPOFOL
Anesthesia: General

## 2018-01-01 MED ORDER — GLYCOPYRROLATE 0.2 MG/ML IJ SOLN
INTRAMUSCULAR | Status: DC | PRN
  Administered 2018-01-01: 0.2 mg via INTRAVENOUS

## 2018-01-01 MED ORDER — SODIUM CHLORIDE 0.9 % IV SOLN
INTRAVENOUS | Status: DC
  Administered 2018-01-01: 1000 mL via INTRAVENOUS

## 2018-01-01 MED ORDER — PROPOFOL 500 MG/50ML IV EMUL
INTRAVENOUS | Status: DC | PRN
  Administered 2018-01-01: 100 ug/kg/min via INTRAVENOUS

## 2018-01-01 MED ORDER — GLYCOPYRROLATE 0.2 MG/ML IJ SOLN
INTRAMUSCULAR | Status: AC
  Filled 2018-01-01: qty 1

## 2018-01-01 MED ORDER — PROPOFOL 500 MG/50ML IV EMUL
INTRAVENOUS | Status: AC
  Filled 2018-01-01: qty 50

## 2018-01-01 MED ORDER — LIDOCAINE HCL (CARDIAC) PF 100 MG/5ML IV SOSY
PREFILLED_SYRINGE | INTRAVENOUS | Status: DC | PRN
  Administered 2018-01-01: 30 mg via INTRAVENOUS
  Administered 2018-01-01: 70 mg via INTRAVENOUS

## 2018-01-01 MED ORDER — PROPOFOL 10 MG/ML IV BOLUS
INTRAVENOUS | Status: DC | PRN
  Administered 2018-01-01: 100 mg via INTRAVENOUS

## 2018-01-01 MED ORDER — LIDOCAINE HCL (PF) 2 % IJ SOLN
INTRAMUSCULAR | Status: AC
  Filled 2018-01-01: qty 10

## 2018-01-01 NOTE — Anesthesia Preprocedure Evaluation (Addendum)
Anesthesia Evaluation  Patient identified by MRN, date of birth, ID band Patient awake    Reviewed: Allergy & Precautions, H&P , NPO status , Patient's Chart, lab work & pertinent test results  Airway Mallampati: III  TM Distance: >3 FB Neck ROM: full    Dental  (+) Missing, Partial Upper, Partial Lower   Pulmonary neg pulmonary ROS, shortness of breath, sleep apnea , COPD, Current Smoker,           Cardiovascular Exercise Tolerance: Poor + Peripheral Vascular Disease  (-) Past MI, (-) Cardiac Stents and (-) CABG negative cardio ROS  (-) dysrhythmias (-) pacemaker     Neuro/Psych negative neurological ROS  negative psych ROS   GI/Hepatic Neg liver ROS, GERD  ,  Endo/Other  Morbid obesity  Renal/GU negative Renal ROS  negative genitourinary   Musculoskeletal   Abdominal   Peds  Hematology negative hematology ROS (+)   Anesthesia Other Findings Chronic nasal congestion / post nasal drip related to allergies per patient.  Treated with doxycycline for resp infection in September, reports this is resolved.  Reproductive/Obstetrics negative OB ROS                            Anesthesia Physical Anesthesia Plan  ASA: III  Anesthesia Plan: General   Post-op Pain Management:    Induction:   PONV Risk Score and Plan: Propofol infusion and TIVA  Airway Management Planned:   Additional Equipment:   Intra-op Plan:   Post-operative Plan:   Informed Consent: I have reviewed the patients History and Physical, chart, labs and discussed the procedure including the risks, benefits and alternatives for the proposed anesthesia with the patient or authorized representative who has indicated his/her understanding and acceptance.   Dental Advisory Given  Plan Discussed with: Anesthesiologist, CRNA and Surgeon  Anesthesia Plan Comments:         Anesthesia Quick Evaluation

## 2018-01-01 NOTE — H&P (Signed)
Outpatient short stay form Pre-procedure 01/01/2018 10:56 AM [name] MD  Primary Physician: [place] Pink, MD  Reason for visit: EGD  History of present illness: Patient is a 72 year old male presenting today for an EGD.  He has a history of Barrett's esophagus dysplasia however his last EGD was negative for this.  At that time he did show a C erosive esophagitis.  He had stopped his proton pump inhibitor about 8 months or so ago.  States he only has reflux symptoms about once or twice a month.  He also had a finding on his last EGD of the submucosal nodule in the antrum.  He is presenting today for follow-up on these issues.  He takes no aspirin or blood thinning agent.  He does have a history of COPD uses an inhaler as well as a recent bout of upper respiratory tract infection.    Current Facility-Administered Medications:  .  0.9 %  sodium chloride infusion, , Intravenous, Continuous, [name], MD  Medications Prior to Admission  Medication Sig Dispense Refill Last Dose  . Dextromethorphan-Guaifenesin 60-1200 MG 12hr tablet Take by mouth.   12/31/2017 at Unknown time  . albuterol (PROVENTIL HFA;VENTOLIN HFA) 108 (90 Base) MCG/ACT inhaler 2 puffs q.i.d. p.r.n. short of breath, wheezing, or cough    at prn  . pantoprazole (PROTONIX) 40 MG tablet Take by mouth.   Not Taking at Unknown time     Allergies  Allergen Reactions  . Cephalexin Rash     Past Medical History:  Diagnosis Date  . Adenomatous polyps   . Barrett's esophagus   . Colon polyp   . COPD (chronic obstructive pulmonary disease) ([place])    uses inhaler, "i may have COPD"  . Dyspnea   . Eye injury   . GERD (gastroesophageal reflux disease)   . History of gonorrhea   . History of pneumonia   . Obesity   . Pneumonia   . Reflux esophagitis   . Sleep apnea     Review of systems:      Physical Exam    Heart and lungs: Regular rate and rhythm without rub or gallop, lungs are bilaterally  clear.    HEENT: Normocephalic atraumatic eyes are anicteric    Other:    Pertinant exam for procedure: Soft/obese, nontender bowel sounds positive normoactive nondistended    Planned proceedures: None indicated procedures. I have discussed the risks benefits and complications of procedures to include not limited to bleeding, infection, perforation and the risk of sedation and the patient wishes to proceed.    [name], MD Gastroenterology 01/01/2018  10:56 AM

## 2018-01-01 NOTE — Anesthesia Post-op Follow-up Note (Signed)
Anesthesia QCDR form completed.        

## 2018-01-01 NOTE — Op Note (Addendum)
[facility] Gastroenterology Patient Name: David Johnson Procedure Date: 01/01/2018 10:54 AM MRN: [id number] Account #: [id number] Date of Birth: 11/11/1945 Admit Type: Outpatient Age: 72 Room: [facility] ENDO ROOM 1 Gender: Male Note Status: Finalized Procedure:            Upper GI endoscopy Indications:          Follow-up of Barrett's esophagus, follow up gastric                        nodule Providers:            [name], MD Referring MD:         Irven Easterly. Kary Kos, MD (Referring MD) Medicines:            Monitored Anesthesia Care Complications:        No immediate complications. Procedure:            Pre-Anesthesia Assessment:                       - ASA Grade Assessment: III - A patient with severe                        systemic disease.                       After obtaining informed consent, the endoscope was                        passed under direct vision. Throughout the procedure,                        the patient's blood pressure, pulse, and oxygen                        saturations were monitored continuously. The Endoscope                        was introduced through the mouth, and advanced to the                        third part of duodenum. The upper GI endoscopy was                        accomplished without difficulty. The patient tolerated                        the procedure well. Findings:      LA Grade B to C (one or more mucosal breaks greater than 5 mm, not       extending between the tops of two mucosal folds) esophagitis with no       bleeding was found.      There were esophageal mucosal changes consistent with short-segment       Barrett's esophagus present in the lower third of the esophagus. The       maximum longitudinal extent of these mucosal changes was 1 cm in length.       Mucosa was biopsied with a cold forceps for histology.      The exam of the esophagus was otherwise normal.      The second portion of the duodenum  and third portion of the  duodenum       were normal. Biopsies were taken with a cold forceps for histology.      Patchy moderately erythematous mucosa without active bleeding and with       no stigmata of bleeding was found in the duodenal bulb. Biopsies were       taken with a cold forceps for histology.      A single 3 mm sessile polyp with no bleeding was found in the duodenal       bulb. The polyp was removed with a cold biopsy forceps. Resection and       retrieval were complete.      Diffuse and patchy mild inflammation characterized by adherent blood,       congestion (edema) and erythema was found in the gastric body and in the       gastric antrum. Biopsies were taken with a cold forceps for histology.      A single 16 mm submucosal papule (nodule) with no bleeding and no       stigmata of recent bleeding was found on the greater curvature of the       gastric antrum. This lesion had a positive pillow sign, possible       extruding fat lign on biopsy. Biopsies were taken with a cold forceps       for histology.      A single 6 mm mucosal papule (nodule) with no bleeding and no stigmata       of recent bleeding was found in the gastric fundus. Biopsies were taken       with a cold forceps for histology. Impression:           - LA Grade B esophagitis.                       - Esophageal mucosal changes consistent with                        short-segment Barrett's esophagus. Biopsied.                       - Normal second portion of the duodenum and third                        portion of the duodenum. Biopsied.                       - Erythematous duodenopathy. Biopsied.                       - A single duodenal polyp. Resected and retrieved.                       - Erosive gastritis. Biopsied.                       - A single submucosal papule (nodule) found in the                        stomach. Biopsied.                       - A single mucosal papule (nodule) found in the  stomach. Biopsied. Recommendation:       - Discharge patient to home.                       - Full liquid diet today, then advance as tolerated to                        advance diet as tolerated. Procedure Code(s):    --- Professional ---                       [phone], Esophagogastroduodenoscopy, flexible, transoral;                        with biopsy, single or multiple Diagnosis Code(s):    --- Professional ---                       K20.9, Esophagitis, unspecified                       K22.70, Barrett's esophagus without dysplasia                       K31.89, Other diseases of stomach and duodenum                       K31.7, Polyp of stomach and duodenum                       K29.60, Other gastritis without bleeding CPT copyright 2018 American Medical Association. All rights reserved. The codes documented in this report are preliminary and upon coder review may  be revised to meet current compliance requirements. [name], MD 01/01/2018 11:46:29 AM This report has been signed electronically. Number of Addenda: 0 Note Initiated On: 01/01/2018 10:54 AM      [facility]

## 2018-01-01 NOTE — Transfer of Care (Signed)
Immediate Anesthesia Transfer of Care Note  Patient: David Johnson  Procedure(s) Performed: ESOPHAGOGASTRODUODENOSCOPY (EGD) WITH PROPOFOL (N/A )  Patient Location: PACU and Endoscopy Unit  Anesthesia Type:General  Level of Consciousness: awake, alert , oriented and patient cooperative  Airway & Oxygen Therapy: Patient Spontanous Breathing  Post-op Assessment: Report given to RN, Post -op Vital signs reviewed and stable and Patient moving all extremities  Post vital signs: Reviewed and stable  Last Vitals:  Vitals Value Taken Time  BP 120/80 01/01/2018 11:41 AM  Temp 36.3 C 01/01/2018 11:41 AM  Pulse 97 01/01/2018 11:43 AM  Resp 20 01/01/2018 11:43 AM  SpO2 98 % 01/01/2018 11:43 AM  Vitals shown include unvalidated device data.  Last Pain:  Vitals:   01/01/18 1141  TempSrc: Tympanic  PainSc: 0-No pain         Complications: No apparent anesthesia complications

## 2018-01-02 LAB — SURGICAL PATHOLOGY

## 2018-01-02 NOTE — Anesthesia Postprocedure Evaluation (Signed)
Anesthesia Post Note  Patient: David Johnson  Procedure(s) Performed: ESOPHAGOGASTRODUODENOSCOPY (EGD) WITH PROPOFOL (N/A )  Patient location during evaluation: PACU Anesthesia Type: General Level of consciousness: awake and alert Pain management: pain level controlled Vital Signs Assessment: post-procedure vital signs reviewed and stable Respiratory status: spontaneous breathing, nonlabored ventilation and respiratory function stable Cardiovascular status: blood pressure returned to baseline and stable Postop Assessment: no apparent nausea or vomiting Anesthetic complications: no     Last Vitals:  Vitals:   01/01/18 1201 01/01/18 1204  BP: 128/82 128/82  Pulse: 90 88  Resp: (!) 25 16  Temp:    SpO2: 98% 97%    Last Pain:  Vitals:   01/01/18 1204  TempSrc:   PainSc: 0-No pain                 Durenda Hurt

## 2018-01-05 ENCOUNTER — Encounter: Payer: Self-pay | Admitting: Gastroenterology

## 2018-09-08 ENCOUNTER — Other Ambulatory Visit
Admission: RE | Admit: 2018-09-08 | Discharge: 2018-09-08 | Disposition: A | Discharge status: Home / Self Care | Payer: Managed Care, Other (non HMO) | Source: Ambulatory Visit | Attending: Gastroenterology | Admitting: Gastroenterology

## 2018-09-08 ENCOUNTER — Other Ambulatory Visit: Payer: Self-pay

## 2018-09-08 DIAGNOSIS — Z1159 Encounter for screening for other viral diseases: Secondary | ICD-10-CM | POA: Diagnosis not present

## 2018-09-08 LAB — SARS CORONAVIRUS 2 (TAT 6-24 HRS): SARS Coronavirus 2: NEGATIVE

## 2018-09-11 ENCOUNTER — Encounter: Payer: Self-pay | Admitting: *Deleted

## 2018-09-11 ENCOUNTER — Encounter
Admission: RE | Disposition: A | Discharge status: Home / Self Care | Payer: Self-pay | Source: Home / Self Care | Attending: Gastroenterology

## 2018-09-11 ENCOUNTER — Ambulatory Visit
Admission: RE | Admit: 2018-09-11 | Discharge: 2018-09-11 | Disposition: A | Discharge status: Home / Self Care | Payer: Managed Care, Other (non HMO) | Attending: Gastroenterology | Admitting: Gastroenterology

## 2018-09-11 ENCOUNTER — Ambulatory Visit: Payer: Managed Care, Other (non HMO) | Admitting: Certified Registered Nurse Anesthetist

## 2018-09-11 ENCOUNTER — Other Ambulatory Visit: Payer: Self-pay

## 2018-09-11 DIAGNOSIS — D123 Benign neoplasm of transverse colon: Secondary | ICD-10-CM | POA: Insufficient documentation

## 2018-09-11 DIAGNOSIS — D175 Benign lipomatous neoplasm of intra-abdominal organs: Secondary | ICD-10-CM | POA: Insufficient documentation

## 2018-09-11 DIAGNOSIS — K2210 Ulcer of esophagus without bleeding: Secondary | ICD-10-CM | POA: Diagnosis not present

## 2018-09-11 DIAGNOSIS — D125 Benign neoplasm of sigmoid colon: Secondary | ICD-10-CM | POA: Diagnosis not present

## 2018-09-11 DIAGNOSIS — Q439 Congenital malformation of intestine, unspecified: Secondary | ICD-10-CM | POA: Diagnosis not present

## 2018-09-11 DIAGNOSIS — F17200 Nicotine dependence, unspecified, uncomplicated: Secondary | ICD-10-CM | POA: Diagnosis not present

## 2018-09-11 DIAGNOSIS — Z09 Encounter for follow-up examination after completed treatment for conditions other than malignant neoplasm: Secondary | ICD-10-CM | POA: Insufficient documentation

## 2018-09-11 DIAGNOSIS — K2970 Gastritis, unspecified, without bleeding: Secondary | ICD-10-CM | POA: Diagnosis not present

## 2018-09-11 DIAGNOSIS — L309 Dermatitis, unspecified: Secondary | ICD-10-CM | POA: Insufficient documentation

## 2018-09-11 DIAGNOSIS — Z86010 Personal history of colonic polyps: Secondary | ICD-10-CM | POA: Insufficient documentation

## 2018-09-11 DIAGNOSIS — Z1389 Encounter for screening for other disorder: Secondary | ICD-10-CM | POA: Diagnosis not present

## 2018-09-11 DIAGNOSIS — J449 Chronic obstructive pulmonary disease, unspecified: Secondary | ICD-10-CM | POA: Diagnosis not present

## 2018-09-11 DIAGNOSIS — I739 Peripheral vascular disease, unspecified: Secondary | ICD-10-CM | POA: Diagnosis not present

## 2018-09-11 DIAGNOSIS — K210 Gastro-esophageal reflux disease with esophagitis: Secondary | ICD-10-CM | POA: Diagnosis not present

## 2018-09-11 DIAGNOSIS — G4730 Sleep apnea, unspecified: Secondary | ICD-10-CM | POA: Insufficient documentation

## 2018-09-11 DIAGNOSIS — K5730 Diverticulosis of large intestine without perforation or abscess without bleeding: Secondary | ICD-10-CM | POA: Insufficient documentation

## 2018-09-11 DIAGNOSIS — K449 Diaphragmatic hernia without obstruction or gangrene: Secondary | ICD-10-CM | POA: Diagnosis not present

## 2018-09-11 DIAGNOSIS — D124 Benign neoplasm of descending colon: Secondary | ICD-10-CM | POA: Insufficient documentation

## 2018-09-11 DIAGNOSIS — K3189 Other diseases of stomach and duodenum: Secondary | ICD-10-CM | POA: Diagnosis not present

## 2018-09-11 HISTORY — PX: COLONOSCOPY WITH PROPOFOL: SHX5780

## 2018-09-11 HISTORY — PX: ESOPHAGOGASTRODUODENOSCOPY (EGD) WITH PROPOFOL: SHX5813

## 2018-09-11 SURGERY — COLONOSCOPY WITH PROPOFOL
Anesthesia: General

## 2018-09-11 MED ORDER — PROPOFOL 500 MG/50ML IV EMUL
INTRAVENOUS | Status: AC
  Filled 2018-09-11: qty 50

## 2018-09-11 MED ORDER — PROPOFOL 10 MG/ML IV BOLUS
INTRAVENOUS | Status: AC
  Filled 2018-09-11: qty 20

## 2018-09-11 MED ORDER — SODIUM CHLORIDE 0.9 % IV SOLN: INTRAVENOUS | Status: DC

## 2018-09-11 MED ORDER — SODIUM CHLORIDE 0.9 % IV SOLN
INTRAVENOUS | Status: DC
  Administered 2018-09-11: 07:00:00 via INTRAVENOUS

## 2018-09-11 MED ORDER — LIDOCAINE HCL (PF) 2 % IJ SOLN
INTRAMUSCULAR | Status: AC
  Filled 2018-09-11: qty 10

## 2018-09-11 MED ORDER — PROPOFOL 500 MG/50ML IV EMUL
INTRAVENOUS | Status: DC | PRN
  Administered 2018-09-11: 130 ug/kg/min via INTRAVENOUS

## 2018-09-11 MED ORDER — PROPOFOL 10 MG/ML IV BOLUS
INTRAVENOUS | Status: DC | PRN
  Administered 2018-09-11: 80 mg via INTRAVENOUS

## 2018-09-11 MED ORDER — LIDOCAINE HCL (CARDIAC) PF 100 MG/5ML IV SOSY
PREFILLED_SYRINGE | INTRAVENOUS | Status: DC | PRN
  Administered 2018-09-11: 50 mg via INTRAVENOUS

## 2018-09-11 NOTE — Anesthesia Preprocedure Evaluation (Signed)
Anesthesia Evaluation  Patient identified by MRN, date of birth, ID band Patient awake    Reviewed: Allergy & Precautions, H&P , NPO status , Patient's Chart, lab work & pertinent test results  History of Anesthesia Complications Negative for: history of anesthetic complications  Airway Mallampati: III  TM Distance: <3 FB Neck ROM: limited    Dental  (+) Chipped, Poor Dentition, Missing   Pulmonary shortness of breath and with exertion, sleep apnea and Continuous Positive Airway Pressure Ventilation , pneumonia, COPD, Current Smoker,           Cardiovascular Exercise Tolerance: Good (-) angina+ Peripheral Vascular Disease  (-) Past MI and (-) DOE      Neuro/Psych negative neurological ROS  negative psych ROS   GI/Hepatic Neg liver ROS, GERD  Medicated and Controlled,  Endo/Other  negative endocrine ROS  Renal/GU negative Renal ROS  negative genitourinary   Musculoskeletal   Abdominal   Peds  Hematology negative hematology ROS (+)   Anesthesia Other Findings Past Medical History: No date: Adenomatous polyps No date: Barrett's esophagus No date: Colon polyp No date: COPD (chronic obstructive pulmonary disease) (HCC)     Comment:  uses inhaler, "i may have COPD" No date: Dyspnea No date: Eye injury No date: GERD (gastroesophageal reflux disease) No date: History of gonorrhea No date: History of pneumonia No date: Obesity No date: Pneumonia No date: Reflux esophagitis No date: Sleep apnea  Past Surgical History: No date: CATARACT EXTRACTION; Left No date: COLONOSCOPY 05/19/2015: COLONOSCOPY WITH PROPOFOL; N/A     Comment:  Procedure: COLONOSCOPY WITH PROPOFOL;  Surgeon: [name], MD;  Location: [facility] ENDOSCOPY;  Service:               Endoscopy;  Laterality: N/A; 05/19/2015: ESOPHAGOGASTRODUODENOSCOPY (EGD) WITH PROPOFOL; N/A     Comment:  Procedure: ESOPHAGOGASTRODUODENOSCOPY (EGD)  WITH               PROPOFOL;  Surgeon: [name], MD;  Location:               [facility] ENDOSCOPY;  Service: Endoscopy;  Laterality: N/A; 10/11/2016: ESOPHAGOGASTRODUODENOSCOPY (EGD) WITH PROPOFOL; N/A     Comment:  Procedure: ESOPHAGOGASTRODUODENOSCOPY (EGD) WITH               PROPOFOL;  Surgeon: [name], MD;  Location:               [facility] ENDOSCOPY;  Service: Endoscopy;  Laterality: N/A; 01/01/2018: ESOPHAGOGASTRODUODENOSCOPY (EGD) WITH PROPOFOL; N/A     Comment:  Procedure: ESOPHAGOGASTRODUODENOSCOPY (EGD) WITH               PROPOFOL;  Surgeon: [name], MD;  Location:               [facility] ENDOSCOPY;  Service: Endoscopy;  Laterality: N/A; No date: EYE SURGERY  BMI    Body Mass Index: 46.03 kg/m      Reproductive/Obstetrics negative OB ROS                             Anesthesia Physical Anesthesia Plan  ASA: III  Anesthesia Plan: General   Post-op Pain Management:    Induction: Intravenous  PONV Risk Score and Plan: Propofol infusion and TIVA  Airway Management Planned: Natural Airway and Nasal Cannula  Additional Equipment:   Intra-op Plan:   Post-operative  Plan:   Informed Consent: I have reviewed the patients History and Physical, chart, labs and discussed the procedure including the risks, benefits and alternatives for the proposed anesthesia with the patient or authorized representative who has indicated his/her understanding and acceptance.     Dental Advisory Given  Plan Discussed with: Anesthesiologist, CRNA and Surgeon  Anesthesia Plan Comments: (Patient consented for risks of anesthesia including but not limited to:  - adverse reactions to medications - risk of intubation if required - damage to teeth, lips or other oral mucosa - sore throat or hoarseness - Damage to heart, brain, lungs or loss of life  Patient voiced understanding.)        Anesthesia Quick Evaluation

## 2018-09-11 NOTE — Anesthesia Postprocedure Evaluation (Signed)
Anesthesia Post Note  Patient: David Johnson  Procedure(s) Performed: COLONOSCOPY WITH PROPOFOL (N/A ) ESOPHAGOGASTRODUODENOSCOPY (EGD) WITH PROPOFOL (N/A )  Patient location during evaluation: Endoscopy Anesthesia Type: General Level of consciousness: awake and alert Pain management: pain level controlled Vital Signs Assessment: post-procedure vital signs reviewed and stable Respiratory status: spontaneous breathing, nonlabored ventilation, respiratory function stable and patient connected to nasal cannula oxygen Cardiovascular status: blood pressure returned to baseline and stable Postop Assessment: no apparent nausea or vomiting Anesthetic complications: no     Last Vitals:  Vitals:   09/11/18 0901 09/11/18 0911  BP: 136/74 115/77  Pulse: (!) 54 (!) 55  Resp: 16 13  Temp:    SpO2: 97% 98%    Last Pain:  Vitals:   09/11/18 0911  TempSrc:   PainSc: 0-No pain                 Precious Haws Aaira Oestreicher

## 2018-09-11 NOTE — Op Note (Signed)
[facility] Gastroenterology Patient Name: David Johnson Procedure Date: 09/11/2018 7:36 AM MRN: [id number] Account #: [id number] Date of Birth: April 25, 1945 Admit Type: Outpatient Age: 73 Room: [facility] ENDO ROOM 1 Gender: Male Note Status: Finalized Procedure:            Upper GI endoscopy Indications:          Follow-up of Barrett's esophagus Providers:            [name], MD Referring MD:         Irven Easterly. Kary Kos, MD (Referring MD) Medicines:            Monitored Anesthesia Care Complications:        No immediate complications. Procedure:            Pre-Anesthesia Assessment:                       - ASA Grade Assessment: III - A patient with severe                        systemic disease.                       After obtaining informed consent, the endoscope was                        passed under direct vision. Throughout the procedure,                        the patient's blood pressure, pulse, and oxygen                        saturations were monitored continuously. The Endoscope                        was introduced through the mouth, and advanced to the                        third part of duodenum. The upper GI endoscopy was                        accomplished without difficulty. The patient tolerated                        the procedure well. Findings:      The Z-line was irregular.      A small hiatal hernia was present.      LA Grade B (one or more mucosal breaks greater than 5 mm, not extending       between the tops of two mucosal folds) esophagitis with no bleeding was       found. Mucosa was biopsied with a cold forceps for histology in a       targeted manner and in 4 quadrants in the lower third of the esophagus.       One specimen bottle was sent to pathology.      The exam of the esophagus was otherwise normal.      Diffuse and patchy mild inflammation characterized by congestion (edema)       and erythema was found in the gastric body.  Biopsies were taken with a       cold forceps for histology. Biopsies were  taken with a cold forceps for       Helicobacter pylori testing. Biopsies were taken with a cold forceps for       histology. Biopsies were taken with a cold forceps for histology from       the antrum, placed into a separate jar. .      Diffuse nodular mucosa was found in the duodenal bulb consistant with       gastric metaplasia noted previously, without apparent change.      A single 8 mm submucosal papule (nodule) with no bleeding and no       stigmata of bleeding positive pillow sign consistant with lipoma was       found on the posterior wall of the gastric body.      The cardia and gastric fundus were normal on retroflexion otherwise. Impression:           - Z-line irregular.                       - Small hiatal hernia.                       - LA Grade B erosive esophagitis. Biopsied.                       - Gastritis. Biopsied.                       - Nodular mucosa in the duodenal bulb.                       - A single submucosal papule (nodule) found in the                        stomach. Recommendation:       - Use Protonix (pantoprazole) 40 mg PO daily daily.                       - Await pathology results. Procedure Code(s):    --- Professional ---                       [phone], Esophagogastroduodenoscopy, flexible, transoral;                        with biopsy, single or multiple Diagnosis Code(s):    --- Professional ---                       K22.8, Other specified diseases of esophagus                       K44.9, Diaphragmatic hernia without obstruction or                        gangrene                       K20.8, Other esophagitis                       K29.70, Gastritis, unspecified, without bleeding                       K31.89, Other diseases of stomach and duodenum  K22.70, Barrett's esophagus without dysplasia CPT copyright 2019 American [employer] Association. All rights  reserved. The codes documented in this report are preliminary and upon coder review may  be revised to meet current compliance requirements. [name], MD 09/11/2018 8:04:15 AM This report has been signed electronically. Number of Addenda: 0 Note Initiated On: 09/11/2018 7:36 AM      [facility]

## 2018-09-11 NOTE — Op Note (Signed)
[facility] Gastroenterology Patient Name: David Johnson Procedure Date: 09/11/2018 7:36 AM MRN: [id number] Account #: [id number] Date of Birth: [birth year] Admit Type: Outpatient Age: 73 Room: [facility] ENDO ROOM 1 Gender: Male Note Status: Finalized Procedure:            Colonoscopy Indications:          Personal history of colonic polyps Providers:            [name], MD Referring MD:         Irven Easterly. Kary Kos, MD (Referring MD) Medicines:            Monitored Anesthesia Care Complications:        No immediate complications. Procedure:            Pre-Anesthesia Assessment:                       - ASA Grade Assessment: III - A patient with severe                        systemic disease.                       After obtaining informed consent, the colonoscope was                        passed under direct vision. Throughout the procedure,                        the patient's blood pressure, pulse, and oxygen                        saturations were monitored continuously. The                        Colonoscope was introduced through the anus and                        advanced to the the cecum, identified by appendiceal                        orifice and ileocecal valve. The colonoscopy was                        performed with moderate difficulty due to poor bowel                        prep and a tortuous colon. Successful completion of the                        procedure was aided by lavage. Findings:      A few small-mouthed diverticula were found in the sigmoid colon and       distal descending colon.      A 3 mm polyp was found in the distal transverse colon. The polyp was       sessile. The polyp was removed with a cold biopsy forceps. Resection and       retrieval were complete.      Two sessile polyps were found in the transverse colon. The polyps were 2       to 4 mm in size. These polyps were removed with  a piecemeal technique       using a cold  biopsy forceps. Resection and retrieval were complete.      A 5 mm polyp was found in the descending colon. The polyp was sessile.       The polyp was removed with a cold snare. Resection and retrieval were       complete.      A 10 mm polyp was found in the proximal sigmoid colon. The polyp was       semi-pedunculated. The polyp was removed with a cold snare. Resection       and retrieval were complete.      There was a medium-sized lipoma, in the proximal ascending colon       positive pillow sign.      The ileocecal valve was mildly lipomatous.      The retroflexed view of the distal rectum and anal verge was normal and       showed no anal or rectal abnormalities.      The perianal exam findings include dermatitis/psoriatic. Impression:           - Diverticulosis in the sigmoid colon and in the distal                        descending colon.                       - One 3 mm polyp in the distal transverse colon,                        removed with a cold biopsy forceps. Resected and                        retrieved.                       - Two 2 to 4 mm polyps in the transverse colon, removed                        piecemeal using a cold biopsy forceps. Resected and                        retrieved.                       - One 5 mm polyp in the descending colon, removed with                        a cold snare. Resected and retrieved.                       - One 10 mm polyp in the proximal sigmoid colon,                        removed with a cold snare. Resected and retrieved.                       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                       - Lipomatous ileocecal valve.                       -  The distal rectum and anal verge are normal on                        retroflexion view.                       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 Recommendation:       - Discharge patient to home.                       - Telephone GI clinic for  pathology results in 5 days. Procedure Code(s):    --- Professional ---                       [phone], Colonoscopy, flexible; with removal of tumor(s),                        polyp(s), or other lesion(s) by snare technique                       45380, 18, Colonoscopy, flexible; with biopsy, single                        or multiple Diagnosis Code(s):    --- Professional ---                       K63.5, Polyp of colon                       D17.5, Benign lipomatous neoplasm of intra-abdominal                        organs                       K63.89, Other specified diseases of intestine                       Z86.010, Personal history of colonic polyps                       K57.30, Diverticulosis of large intestine without                        perforation or abscess without bleeding CPT copyright 2019 American Medical Association. All rights reserved. The codes documented in this report are preliminary and upon coder review may  be revised to meet current compliance requirements. [name], MD 09/11/2018 8:44:42 AM This report has been signed electronically. Number of Addenda: 0 Note Initiated On: 09/11/2018 7:36 AM Scope Withdrawal Time: 0 hours 16 minutes 18 seconds  Total Procedure Duration: 0 hours 29 minutes 52 seconds       [facility]

## 2018-09-11 NOTE — H&P (Signed)
Outpatient short stay form Pre-procedure 09/11/2018 7:32 AM [name] MD  Primary Physician: Dr. [place] Pink  Reason for visit: EGD and colonoscopy  History of present illness: Patient is a 73 year old male presenting today for an EGD and colonoscopy in regards to his personal history of Barrett's esophagus and colon polyps.  On his last EGD dated 01/01/2018 he showed a grade B erosive esophagitis duodenal polyp which when biopsies showed this to be ectopic gastric mucosa with no significant histopathologic change.  He also showed a submucosal nodule in the stomach which clinically was consistent with a lipoma.  Patient tolerated his prep well.  He takes no aspirin or blood thinning agent.  He does take a proton pump inhibitor however this is been irregular.    Current Facility-Administered Medications:  .  0.9 %  sodium chloride infusion, , Intravenous, Continuous, [name], MD .  0.9 %  sodium chloride infusion, , Intravenous, Continuous, [name], MD, Last Rate: 20 mL/hr at 09/11/18 6712  Medications Prior to Admission  Medication Sig Dispense Refill Last Dose  . albuterol (PROVENTIL HFA;VENTOLIN HFA) 108 (90 Base) MCG/ACT inhaler 2 puffs q.i.d. p.r.n. short of breath, wheezing, or cough   Past Week at Unknown time  . Dextromethorphan-Guaifenesin 60-1200 MG 12hr tablet Take by mouth.   Past Week at Unknown time  . omeprazole (PRILOSEC) 40 MG capsule Take 40 mg by mouth daily.   Past Week at Unknown time  . pantoprazole (PROTONIX) 40 MG tablet Take by mouth.   Past Week at Unknown time     Allergies  Allergen Reactions  . Cephalexin Rash     Past Medical History:  Diagnosis Date  . Adenomatous polyps   . Barrett's esophagus   . Colon polyp   . COPD (chronic obstructive pulmonary disease) ([place])    uses inhaler, "i may have COPD"  . Dyspnea   . Eye injury   . GERD (gastroesophageal reflux disease)   . History of gonorrhea   . History of pneumonia    . Obesity   . Pneumonia   . Reflux esophagitis   . Sleep apnea     Review of systems:      Physical Exam    Heart and lungs: Regular rate and rhythm without rub or gallop lungs are bilaterally clear    HEENT: Normocephalic atraumatic eyes are anicteric    Other:    Pertinant exam for procedure: Soft nontender nondistended bowel sounds positive normoactive    Planned proceedures: EGD, colonoscopy and indicated procedures I have discussed the risks benefits and complications of procedures to include not limited to bleeding, infection, perforation and the risk of sedation and the patient wishes to proceed.    [name], MD Gastroenterology 09/11/2018  7:32 AM

## 2018-09-11 NOTE — Transfer of Care (Signed)
Immediate Anesthesia Transfer of Care Note  Patient: David Johnson  Procedure(s) Performed: COLONOSCOPY WITH PROPOFOL (N/A ) ESOPHAGOGASTRODUODENOSCOPY (EGD) WITH PROPOFOL (N/A )  Patient Location: PACU and Endoscopy Unit  Anesthesia Type:General  Level of Consciousness: drowsy  Airway & Oxygen Therapy: Patient Spontanous Breathing and Patient connected to nasal cannula oxygen  Post-op Assessment: Report given to RN and Post -op Vital signs reviewed and stable  Post vital signs: Reviewed and stable  Last Vitals:  Vitals Value Taken Time  BP 124/70 09/11/18 0841  Temp 36.3 C 09/11/18 0841  Pulse 57 09/11/18 0841  Resp 23 09/11/18 0841  SpO2 93 % 09/11/18 0841    Last Pain:  Vitals:   09/11/18 0841  TempSrc: Tympanic  PainSc: Asleep         Complications: No apparent anesthesia complications

## 2018-09-11 NOTE — Anesthesia Post-op Follow-up Note (Signed)
Anesthesia QCDR form completed.        

## 2018-09-14 ENCOUNTER — Encounter: Payer: Self-pay | Admitting: Gastroenterology

## 2018-09-14 LAB — SURGICAL PATHOLOGY

## 2019-08-09 ENCOUNTER — Ambulatory Visit: Payer: Managed Care, Other (non HMO) | Admitting: Dermatology

## 2019-08-09 ENCOUNTER — Other Ambulatory Visit: Payer: Self-pay

## 2019-08-09 DIAGNOSIS — L304 Erythema intertrigo: Secondary | ICD-10-CM

## 2019-08-09 DIAGNOSIS — L209 Atopic dermatitis, unspecified: Secondary | ICD-10-CM

## 2019-08-09 DIAGNOSIS — B372 Candidiasis of skin and nail: Secondary | ICD-10-CM | POA: Diagnosis not present

## 2019-08-09 MED ORDER — KETOCONAZOLE 2 % EX CREA: TOPICAL_CREAM | CUTANEOUS | 11 refills | Status: DC

## 2019-08-09 MED ORDER — MOMETASONE FUROATE 0.1 % EX CREA: TOPICAL_CREAM | CUTANEOUS | 6 refills | Status: DC

## 2019-08-09 NOTE — Progress Notes (Signed)
   Follow-Up Visit   Subjective  David Johnson is a 74 y.o. male who presents for the following: Follow-up.  Patient is here for a follow up from 01/27/19 for Eczema and Intertrigo, was prescribed Ketoconazole 2% cream for Intertrigo and Mometasone cream .. Patient is currently not using either medication for either condition.   The following portions of the chart were reviewed this encounter and updated as appropriate:  Tobacco  Allergies  Meds  Problems  Med Hx  Surg Hx  Fam Hx      Review of Systems:  No other skin or systemic complaints except as noted in HPI or Assessment and Plan.  Objective  Well appearing patient in no apparent distress; mood and affect are within normal limits.  A focused examination was performed including Buttock, and B/L Ears. Relevant physical exam findings are noted in the Assessment and Plan.  Objective  Left Ear: Pinkness with some scale   Objective  Gluteal Crease: Gluteal crease clear.   Assessment & Plan  Atopic dermatitis, unspecified type Left Ear  /Eczema  Improved but persistent Continue Mometasone cream to affected areas as needed.  Topical steroids (such as triamcinolone, fluocinolone, fluocinonide, mometasone, clobetasol, halobetasol, betamethasone, hydrocortisone) can cause thinning and lightening of the skin if they are used for too long in the same area. Your physician has selected the right strength medicine for your problem and area affected on the body. Please use your medication only as directed by your physician to prevent side effects.    mometasone (ELOCON) 0.1 % cream - Left Ear  Erythema intertrigo with Candida Gluteal Crease Improved today and clear.  May recur. Continue Ketoconazole 2% cream to affected area prn  Return in about 1 year (around 08/08/2020).   Marene Lenz, CMA, am acting as scribe for Sarina Ser, MD .  Documentation: I have reviewed the above documentation for accuracy and  completeness, and I agree with the above.  Sarina Ser, MD

## 2019-08-09 NOTE — Patient Instructions (Addendum)
Recommend daily broad spectrum sunscreen SPF 30+ to sun-exposed areas, reapply every 2 hours as needed. Call for new or changing lesions.  Topical steroids (such as triamcinolone, fluocinolone, fluocinonide, mometasone, clobetasol, halobetasol, betamethasone, hydrocortisone) can cause thinning and lightening of the skin if they are used for too long in the same area. Your physician has selected the right strength medicine for your problem and area affected on the body. Please use your medication only as directed by your physician to prevent side effects.

## 2019-08-11 ENCOUNTER — Encounter: Payer: Self-pay | Admitting: Dermatology

## 2020-03-30 ENCOUNTER — Encounter: Payer: Self-pay | Admitting: Emergency Medicine

## 2020-03-30 ENCOUNTER — Emergency Department: Payer: Managed Care, Other (non HMO)

## 2020-03-30 ENCOUNTER — Other Ambulatory Visit: Payer: Self-pay

## 2020-03-30 ENCOUNTER — Inpatient Hospital Stay
Admission: EM | Admit: 2020-03-30 | Discharge: 2020-04-01 | DRG: 191 | Disposition: A | Discharge status: Home / Self Care | Payer: Managed Care, Other (non HMO) | Attending: Hospitalist | Admitting: Hospitalist

## 2020-03-30 DIAGNOSIS — C771 Secondary and unspecified malignant neoplasm of intrathoracic lymph nodes: Secondary | ICD-10-CM | POA: Diagnosis present

## 2020-03-30 DIAGNOSIS — J449 Chronic obstructive pulmonary disease, unspecified: Secondary | ICD-10-CM | POA: Diagnosis present

## 2020-03-30 DIAGNOSIS — J90 Pleural effusion, not elsewhere classified: Secondary | ICD-10-CM | POA: Diagnosis present

## 2020-03-30 DIAGNOSIS — K219 Gastro-esophageal reflux disease without esophagitis: Secondary | ICD-10-CM | POA: Diagnosis present

## 2020-03-30 DIAGNOSIS — R0789 Other chest pain: Secondary | ICD-10-CM | POA: Diagnosis present

## 2020-03-30 DIAGNOSIS — C799 Secondary malignant neoplasm of unspecified site: Secondary | ICD-10-CM | POA: Diagnosis present

## 2020-03-30 DIAGNOSIS — Z720 Tobacco use: Secondary | ICD-10-CM

## 2020-03-30 DIAGNOSIS — C3431 Malignant neoplasm of lower lobe, right bronchus or lung: Secondary | ICD-10-CM | POA: Diagnosis present

## 2020-03-30 DIAGNOSIS — Z9842 Cataract extraction status, left eye: Secondary | ICD-10-CM | POA: Diagnosis not present

## 2020-03-30 DIAGNOSIS — D751 Secondary polycythemia: Secondary | ICD-10-CM | POA: Diagnosis present

## 2020-03-30 DIAGNOSIS — J9811 Atelectasis: Secondary | ICD-10-CM | POA: Diagnosis present

## 2020-03-30 DIAGNOSIS — J441 Chronic obstructive pulmonary disease with (acute) exacerbation: Secondary | ICD-10-CM | POA: Diagnosis not present

## 2020-03-30 DIAGNOSIS — G4733 Obstructive sleep apnea (adult) (pediatric): Secondary | ICD-10-CM | POA: Diagnosis present

## 2020-03-30 DIAGNOSIS — R7989 Other specified abnormal findings of blood chemistry: Secondary | ICD-10-CM

## 2020-03-30 DIAGNOSIS — Z9889 Other specified postprocedural states: Secondary | ICD-10-CM

## 2020-03-30 DIAGNOSIS — Z6841 Body Mass Index (BMI) 40.0 and over, adult: Secondary | ICD-10-CM

## 2020-03-30 DIAGNOSIS — Z20822 Contact with and (suspected) exposure to covid-19: Secondary | ICD-10-CM | POA: Diagnosis present

## 2020-03-30 DIAGNOSIS — R0602 Shortness of breath: Secondary | ICD-10-CM | POA: Diagnosis present

## 2020-03-30 DIAGNOSIS — C781 Secondary malignant neoplasm of mediastinum: Secondary | ICD-10-CM | POA: Diagnosis present

## 2020-03-30 DIAGNOSIS — Z79899 Other long term (current) drug therapy: Secondary | ICD-10-CM

## 2020-03-30 DIAGNOSIS — Z888 Allergy status to other drugs, medicaments and biological substances status: Secondary | ICD-10-CM | POA: Diagnosis not present

## 2020-03-30 DIAGNOSIS — Z87891 Personal history of nicotine dependence: Secondary | ICD-10-CM | POA: Diagnosis not present

## 2020-03-30 DIAGNOSIS — Z825 Family history of asthma and other chronic lower respiratory diseases: Secondary | ICD-10-CM

## 2020-03-30 DIAGNOSIS — C7801 Secondary malignant neoplasm of right lung: Secondary | ICD-10-CM | POA: Diagnosis present

## 2020-03-30 DIAGNOSIS — E669 Obesity, unspecified: Secondary | ICD-10-CM | POA: Diagnosis present

## 2020-03-30 LAB — CBC WITH DIFFERENTIAL/PLATELET
Abs Immature Granulocytes: 0.07 10*3/uL (ref 0.00–0.07)
Basophils Absolute: 0.1 10*3/uL (ref 0.0–0.1)
Basophils Relative: 1 %
Eosinophils Absolute: 0.1 10*3/uL (ref 0.0–0.5)
Eosinophils Relative: 1 %
HCT: 55.7 % — ABNORMAL HIGH (ref 39.0–52.0)
Hemoglobin: 19.1 g/dL — ABNORMAL HIGH (ref 13.0–17.0)
Immature Granulocytes: 1 %
Lymphocytes Relative: 16 %
Lymphs Abs: 2.1 10*3/uL (ref 0.7–4.0)
MCH: 31.5 pg (ref 26.0–34.0)
MCHC: 34.3 g/dL (ref 30.0–36.0)
MCV: 91.8 fL (ref 80.0–100.0)
Monocytes Absolute: 1 10*3/uL (ref 0.1–1.0)
Monocytes Relative: 8 %
Neutro Abs: 10 10*3/uL — ABNORMAL HIGH (ref 1.7–7.7)
Neutrophils Relative %: 73 %
Platelets: 201 10*3/uL (ref 150–400)
RBC: 6.07 MIL/uL — ABNORMAL HIGH (ref 4.22–5.81)
RDW: 13.5 % (ref 11.5–15.5)
WBC: 13.3 10*3/uL — ABNORMAL HIGH (ref 4.0–10.5)
nRBC: 0 % (ref 0.0–0.2)

## 2020-03-30 LAB — TROPONIN I (HIGH SENSITIVITY)
Troponin I (High Sensitivity): 18 ng/L — ABNORMAL HIGH (ref <18)
Troponin I (High Sensitivity): 15 ng/L (ref <18)

## 2020-03-30 LAB — BLOOD GAS, VENOUS
Acid-Base Excess: 1.8 mmol/L (ref 0.0–2.0)
Bicarbonate: 27.3 mmol/L (ref 20.0–28.0)
O2 Saturation: 77.5 %
Patient temperature: 37
pCO2, Ven: 44 mmHg (ref 44.0–60.0)
pH, Ven: 7.4 (ref 7.250–7.430)
pO2, Ven: 42 mmHg (ref 32.0–45.0)

## 2020-03-30 LAB — BRAIN NATRIURETIC PEPTIDE: B Natriuretic Peptide: 79.5 pg/mL (ref 0.0–100.0)

## 2020-03-30 LAB — COMPREHENSIVE METABOLIC PANEL
ALT: 55 U/L — ABNORMAL HIGH (ref 0–44)
AST: 36 U/L (ref 15–41)
Albumin: 3.9 g/dL (ref 3.5–5.0)
Alkaline Phosphatase: 66 U/L (ref 38–126)
Anion gap: 8 (ref 5–15)
BUN: 11 mg/dL (ref 8–23)
CO2: 26 mmol/L (ref 22–32)
Calcium: 9.2 mg/dL (ref 8.9–10.3)
Chloride: 105 mmol/L (ref 98–111)
Creatinine, Ser: 0.74 mg/dL (ref 0.61–1.24)
GFR, Estimated: >60 mL/min (ref >60)
Glucose, Bld: 116 mg/dL — ABNORMAL HIGH (ref 70–99)
Potassium: 4.4 mmol/L (ref 3.5–5.1)
Sodium: 139 mmol/L (ref 135–145)
Total Bilirubin: 0.9 mg/dL (ref 0.3–1.2)
Total Protein: 7.2 g/dL (ref 6.5–8.1)

## 2020-03-30 LAB — MAGNESIUM: Magnesium: 2.2 mg/dL (ref 1.7–2.4)

## 2020-03-30 LAB — PROCALCITONIN: Procalcitonin: <0.1 ng/mL

## 2020-03-30 LAB — FIBRIN DERIVATIVES D-DIMER (ARMC ONLY): Fibrin derivatives D-dimer (ARMC): 3380.51 ng/mL (FEU) — ABNORMAL HIGH (ref 0.00–499.00)

## 2020-03-30 LAB — SARS CORONAVIRUS 2 BY RT PCR (HOSPITAL ORDER, PERFORMED IN ~~LOC~~ HOSPITAL LAB): SARS Coronavirus 2: NEGATIVE

## 2020-03-30 MED ORDER — IPRATROPIUM-ALBUTEROL 0.5-2.5 (3) MG/3ML IN SOLN
9.0000 mL | Freq: Once | RESPIRATORY_TRACT | Status: AC
  Administered 2020-03-30: 9 mL via RESPIRATORY_TRACT
  Filled 2020-03-30: qty 3

## 2020-03-30 MED ORDER — METHYLPREDNISOLONE SODIUM SUCC 40 MG IJ SOLR
40.0000 mg | Freq: Four times a day (QID) | INTRAMUSCULAR | Status: DC
  Administered 2020-03-30 – 2020-03-31 (×3): 40 mg via INTRAVENOUS
  Filled 2020-03-30 (×3): qty 1

## 2020-03-30 MED ORDER — ACETAMINOPHEN 325 MG PO TABS: 650.0000 mg | ORAL_TABLET | Freq: Four times a day (QID) | ORAL | Status: DC | PRN

## 2020-03-30 MED ORDER — LABETALOL HCL 5 MG/ML IV SOLN
10.0000 mg | Freq: Once | INTRAVENOUS | Status: AC
  Administered 2020-03-30: 10 mg via INTRAVENOUS
  Filled 2020-03-30: qty 4

## 2020-03-30 MED ORDER — ALBUTEROL SULFATE HFA 108 (90 BASE) MCG/ACT IN AERS
1.0000 | INHALATION_SPRAY | RESPIRATORY_TRACT | Status: DC | PRN
  Filled 2020-03-30: qty 6.7

## 2020-03-30 MED ORDER — KETOROLAC TROMETHAMINE 15 MG/ML IJ SOLN: 15.0000 mg | Freq: Four times a day (QID) | INTRAMUSCULAR | Status: DC | PRN

## 2020-03-30 MED ORDER — METHYLPREDNISOLONE SODIUM SUCC 125 MG IJ SOLR
125.0000 mg | Freq: Once | INTRAMUSCULAR | Status: AC
  Administered 2020-03-30: 125 mg via INTRAVENOUS
  Filled 2020-03-30: qty 2

## 2020-03-30 MED ORDER — ACETAMINOPHEN 650 MG RE SUPP: 650.0000 mg | Freq: Four times a day (QID) | RECTAL | Status: DC | PRN

## 2020-03-30 MED ORDER — IPRATROPIUM-ALBUTEROL 0.5-2.5 (3) MG/3ML IN SOLN
3.0000 mL | Freq: Four times a day (QID) | RESPIRATORY_TRACT | Status: DC
  Administered 2020-03-30 – 2020-03-31 (×3): 3 mL via RESPIRATORY_TRACT
  Filled 2020-03-30 (×3): qty 3

## 2020-03-30 MED ORDER — NITROGLYCERIN 0.4 MG SL SUBL
0.4000 mg | SUBLINGUAL_TABLET | SUBLINGUAL | Status: DC | PRN
  Administered 2020-03-30: 0.4 mg via SUBLINGUAL
  Filled 2020-03-30: qty 1

## 2020-03-30 MED ORDER — SODIUM CHLORIDE 0.9 % IV SOLN
500.0000 mg | INTRAVENOUS | Status: DC
  Filled 2020-03-30: qty 500

## 2020-03-30 MED ORDER — IOHEXOL 350 MG/ML SOLN
125.0000 mL | Freq: Once | INTRAVENOUS | Status: AC | PRN
  Administered 2020-03-30: 125 mL via INTRAVENOUS

## 2020-03-30 NOTE — ED Notes (Signed)
Replaced patient's BO cuff at his request d/t popping off.  Patient awake and conversing.

## 2020-03-30 NOTE — ED Provider Notes (Signed)
[facility] Emergency Department Provider Note  ____________________________________________   Event Date/Time   First MD Initiated Contact with Patient 03/30/20 [phone]     (approximate)  I have reviewed the triage vital signs and the nursing notes.   HISTORY  Chief Complaint Chest Pain   HPI David Johnson is a 75 y.o. male with past medical history of tobacco abuse, COPD, OSA on CPAP at night, obesity, GERD, PAD, and polycythemia who presents accompanied by his wife for assessment of some chest pain that began last night around 2 AM.  Patient states it radiates into his neck and back is currently 4/10 intensity.  It was initially much worse.  He describes as pressure-like and sharp.Marland Kitchen  He also states that over the last 3 to 4 weeks he has had worsening shortness of breath with exertion and some congestion.  He denies any headache, earache, sore throat, abdominal pain, back pain, vomiting, diarrhea, dysuria, rash or extremity pain.  He states he sleeps on his side and is medically orthopneic.  Not sure if he has gained or lost any weight recently.  He denies illicit drug use or regular EtOH use.  No known CAD or other cardiopulmonary history aside from COPD.         Past Medical History:  Diagnosis Date  . Adenomatous polyps   . Barrett's esophagus   . Colon polyp   . COPD (chronic obstructive pulmonary disease) ([place])    uses inhaler, "i may have COPD"  . Dyspnea   . Eye injury   . GERD (gastroesophageal reflux disease)   . History of gonorrhea   . History of pneumonia   . Obesity   . Pneumonia   . Reflux esophagitis   . Sleep apnea     Patient Active Problem List   Diagnosis Date Noted  . PAD (peripheral artery disease) ([place]) 11/24/2017  . GERD (gastroesophageal reflux disease) 11/24/2017  . Polycythemia 06/26/2016  . Obesity 06/03/2016  . Sleep apnea 06/03/2016    Past Surgical History:  Procedure Laterality Date  . CATARACT EXTRACTION  Left   . COLONOSCOPY    . COLONOSCOPY WITH PROPOFOL N/A 05/19/2015   Procedure: COLONOSCOPY WITH PROPOFOL;  Surgeon: [name], MD;  Location: [facility] ENDOSCOPY;  Service: Endoscopy;  Laterality: N/A;  . COLONOSCOPY WITH PROPOFOL N/A 09/11/2018   Procedure: COLONOSCOPY WITH PROPOFOL;  Surgeon: [name], MD;  Location: [facility] ENDOSCOPY;  Service: Endoscopy;  Laterality: N/A;  . ESOPHAGOGASTRODUODENOSCOPY (EGD) WITH PROPOFOL N/A 05/19/2015   Procedure: ESOPHAGOGASTRODUODENOSCOPY (EGD) WITH PROPOFOL;  Surgeon: [name], MD;  Location: [facility] ENDOSCOPY;  Service: Endoscopy;  Laterality: N/A;  . ESOPHAGOGASTRODUODENOSCOPY (EGD) WITH PROPOFOL N/A 10/11/2016   Procedure: ESOPHAGOGASTRODUODENOSCOPY (EGD) WITH PROPOFOL;  Surgeon: [name], MD;  Location: [facility] ENDOSCOPY;  Service: Endoscopy;  Laterality: N/A;  . ESOPHAGOGASTRODUODENOSCOPY (EGD) WITH PROPOFOL N/A 01/01/2018   Procedure: ESOPHAGOGASTRODUODENOSCOPY (EGD) WITH PROPOFOL;  Surgeon: [name], MD;  Location: [facility] ENDOSCOPY;  Service: Endoscopy;  Laterality: N/A;  . ESOPHAGOGASTRODUODENOSCOPY (EGD) WITH PROPOFOL N/A 09/11/2018   Procedure: ESOPHAGOGASTRODUODENOSCOPY (EGD) WITH PROPOFOL;  Surgeon: [name], MD;  Location: [facility] ENDOSCOPY;  Service: Endoscopy;  Laterality: N/A;  . EYE SURGERY      Prior to Admission medications   Medication Sig Start Date End Date Taking? Authorizing Provider  albuterol (PROVENTIL HFA;VENTOLIN HFA) 108 (90 Base) MCG/ACT inhaler 2 puffs q.i.d. p.r.n. short of breath, wheezing, or cough 07/21/16   [provider]  Dextromethorphan-Guaifenesin 60-1200 MG 12hr tablet Take by mouth.    [provider]  ketoconazole (NIZORAL) 2 % cream APPLY SMALL AMOUNT EXTERNALLY TO THE AFFECTED AREA TWICE DAILY 08/09/19   Ralene Bathe, MD  mometasone (ELOCON) 0.1 % cream Apply to affected areas around ears once to twice daily as needed 08/09/19   Ralene Bathe, MD   omeprazole (PRILOSEC) 40 MG capsule Take 40 mg by mouth daily. Patient not taking: Reported on 08/09/2019    [provider]  pantoprazole (PROTONIX) 40 MG tablet Take by mouth. Patient not taking: Reported on 08/09/2019 10/11/16   [provider]    Allergies Cephalexin  Family History  Problem Relation Age of Onset  . Emphysema Mother     Social History Social History   Tobacco Use  . Smoking status: Current Every Day Smoker    Packs/day: 1.50    Years: 35.00    Pack years: 52.50    Types: Cigarettes, Cigars    Last attempt to quit: 03/04/2015    Years since quitting: 5.0  . Smokeless tobacco: Never Used  Vaping Use  . Vaping Use: Every day  . Substances: Flavoring  Substance Use Topics  . Alcohol use: Yes    Alcohol/week: 8.0 standard drinks    Types: 8 Cans of beer per week    Comment: Occassionally on weekends   . Drug use: No    Review of Systems  Review of Systems  Constitutional: Negative for chills and fever.  HENT: Positive for congestion. Negative for sore throat.   Eyes: Negative for pain.  Respiratory: Positive for shortness of breath. Negative for cough and stridor.   Cardiovascular: Positive for chest pain.  Gastrointestinal: Negative for vomiting.  Genitourinary: Negative for dysuria.  Musculoskeletal: Negative for myalgias.  Skin: Negative for rash.  Neurological: Negative for seizures, loss of consciousness and headaches.  Psychiatric/Behavioral: Negative for suicidal ideas.  All other systems reviewed and are negative.     ____________________________________________   PHYSICAL EXAM:  VITAL SIGNS: ED Triage Vitals  Enc Vitals Group     BP      Pulse      Resp      Temp      Temp src      SpO2      Weight      Height      Head Circumference      Peak Flow      Pain Score      Pain Loc      Pain Edu?      Excl. in [place]?    Vitals:   03/30/20 1200 03/30/20 1300  BP: 140/65 (!) 146/61  Pulse: 85 (!) 58   Resp: (!) 24 (!) 21  Temp:    SpO2: 96% 93%   Physical Exam Vitals and nursing note reviewed.  Constitutional:      Appearance: He is well-developed and well-nourished. He is obese.  HENT:     Head: Normocephalic and atraumatic.     Right Ear: External ear normal.     Left Ear: External ear normal.     Nose: Nose normal.  Eyes:     Conjunctiva/sclera: Conjunctivae normal.  Cardiovascular:     Rate and Rhythm: Normal rate and regular rhythm.     Heart sounds: No murmur heard.   Pulmonary:     Effort: Pulmonary effort is normal. Tachypnea present. No respiratory distress.     Breath sounds: Examination of the right-upper  field reveals wheezing. Examination of the left-upper field reveals wheezing. Examination of the right-middle field reveals wheezing. Examination of the left-middle field reveals wheezing. Examination of the right-lower field reveals wheezing. Examination of the left-lower field reveals wheezing. Wheezing present.  Abdominal:     Palpations: Abdomen is soft.     Tenderness: There is no abdominal tenderness.  Musculoskeletal:        General: No edema.     Cervical back: Neck supple.     Right lower leg: No edema.     Left lower leg: No edema.  Skin:    General: Skin is warm and dry.     Capillary Refill: Capillary refill takes less than 2 seconds.  Neurological:     General: No focal deficit present.     Mental Status: He is alert and oriented to person, place, and time.  Psychiatric:        Mood and Affect: Mood and affect and mood normal.     Bilateral lower extremity circumferential skin discoloration slightly more prominent on the right than the left.  2+ bilateral radial and DP pulses. ____________________________________________   LABS (all labs ordered are listed, but only abnormal results are displayed)  Labs Reviewed  CBC WITH DIFFERENTIAL/PLATELET - Abnormal; Notable for the following components:      Result Value   WBC 13.3 (*)    RBC 6.07  (*)    Hemoglobin 19.1 (*)    HCT 55.7 (*)    Neutro Abs 10.0 (*)    All other components within normal limits  COMPREHENSIVE METABOLIC PANEL - Abnormal; Notable for the following components:   Glucose, Bld 116 (*)    ALT 55 (*)    All other components within normal limits  FIBRIN DERIVATIVES D-DIMER ([facility] ONLY) - Abnormal; Notable for the following components:   Fibrin derivatives D-dimer ([facility]) 3,380.51 (*)    All other components within normal limits  TROPONIN I (HIGH SENSITIVITY) - Abnormal; Notable for the following components:   Troponin I (High Sensitivity) 18 (*)    All other components within normal limits  SARS CORONAVIRUS 2 BY RT PCR (HOSPITAL ORDER, [place] [facility])  PROCALCITONIN  BRAIN NATRIURETIC PEPTIDE  BLOOD GAS, VENOUS  TROPONIN I (HIGH SENSITIVITY)   ____________________________________________  EKG  Sinus rhythm with a ventricular rate of 78, Q waves in leads I and aVL without other clear evidence of acute ischemia.  Unremarkable intervals.  Normal axis.  No other clear normalities. ____________________________________________  RADIOLOGY  ED MD interpretation: Right-sided pleural effusion and some very mild pulmonary edema.  No clear focal consolidation, pneumothorax, or other acute intrathoracic process.  CTA chest abdomen pelvis dissection protocol does not show evidence of a dissection but does show findings consistent for multiple metastatic lesions from unknown primary source.  CTA chest shows no aneurysm or dissection and no pericardial effusion.  Abdomen pelvis shows no clear acute dissection AAA or other abdominal acute pathology.  Official radiology report(s): DG Chest 2 View  Result Date: 03/30/2020 CLINICAL DATA:  Pain and stiffness EXAM: CHEST - 2 VIEW COMPARISON:  June 03, 2016 FINDINGS: Cardiomediastinal silhouette is enlarged in contour. There is a rounded nodular opacity projecting along the RIGHT superior hilar border.  Possible thickening of the RIGHT paratracheal stripe. There is a moderate RIGHT pleural effusion. No significant LEFT pleural effusion. Homogeneous opacification of the RIGHT lower lung. Mild diffuse interstitial prominence. Visualized abdomen is unremarkable. Mild degenerative changes of the thoracic spine. IMPRESSION:  1. Rounded nodular opacity projecting along the RIGHT superior hilar border with possible thickening of the RIGHT paratracheal stripe. Consider further evaluation with dedicated cross-sectional imaging. 2. Moderate RIGHT pleural effusion. 3. Mild pulmonary edema. Homogeneous opacification of the RIGHT lower lung, most likely atelectasis. Electronically Signed   By: Valentino Saxon MD   On: 03/30/2020 10:28   CT Angio Chest/Abd/Pel for Dissection W and/or Wo Contrast  Result Date: 03/30/2020 CLINICAL DATA:  Chest and back pain. EXAM: CT ANGIOGRAPHY CHEST, ABDOMEN AND PELVIS TECHNIQUE: Non-contrast CT of the chest was initially obtained. Multidetector CT imaging through the chest, abdomen and pelvis was performed using the standard protocol during bolus administration of intravenous contrast. Multiplanar reconstructed images and MIPs were obtained and reviewed to evaluate the vascular anatomy. CONTRAST:  140mL OMNIPAQUE IOHEXOL 350 MG/ML SOLN COMPARISON:  None. FINDINGS: CTA CHEST FINDINGS Cardiovascular: Preferential opacification of the thoracic aorta. No evidence of thoracic aortic aneurysm or dissection. Normal heart size. No pericardial effusion. Mediastinum/Nodes: The esophagus is unremarkable. 6.4 x 3.7 cm right paratracheal adenopathy is noted concerning for malignancy or metastatic disease. 1.9 cm right hilar lymph node is noted. 12 mm anterior mediastinal lymph node is noted concerning for metastatic disease. 2.8 cm right thyroid nodule is noted. Lungs/Pleura: No pneumothorax is noted. Left lung is clear. Moderate size right pleural effusion is noted with associated atelectasis of the  right lower lobe. Several pleural-based masses are noted posteriorly concerning for metastatic disease. Large irregular opacity is noted posteriorly in the right upper lobe which may represent malignancy or associated postobstructive atelectasis. 12 x 9 mm nodule is noted in right middle lobe concerning for metastatic disease. Musculoskeletal: Lytic lesion is noted in the manubrium consistent with metastatic disease. Review of the MIP images confirms the above findings. CTA ABDOMEN AND PELVIS FINDINGS VASCULAR Aorta: Normal caliber aorta without aneurysm, dissection, vasculitis or significant stenosis. Celiac: Patent without evidence of aneurysm, dissection, vasculitis or significant stenosis. SMA: Patent without evidence of aneurysm, dissection, vasculitis or significant stenosis. Renals: Both renal arteries are patent without evidence of aneurysm, dissection, vasculitis, fibromuscular dysplasia or significant stenosis. IMA: Patent without evidence of aneurysm, dissection, vasculitis or significant stenosis. Inflow: Patent without evidence of aneurysm, dissection, vasculitis or significant stenosis. Veins: No obvious venous abnormality within the limitations of this arterial phase study. Review of the MIP images confirms the above findings. NON-VASCULAR Hepatobiliary: Cholelithiasis is noted. No biliary dilatation is noted. The liver is unremarkable. Pancreas: Unremarkable. No pancreatic ductal dilatation or surrounding inflammatory changes. Spleen: Normal in size without focal abnormality. Adrenals/Urinary Tract: Adrenal glands are unremarkable. Kidneys are normal, without renal calculi, focal lesion, or hydronephrosis. Bladder is unremarkable. Stomach/Bowel: Stomach is within normal limits. Appendix appears normal. No evidence of bowel wall thickening, distention, or inflammatory changes. Lymphatic: No significant adenopathy is noted in the abdomen or pelvis. Reproductive: Mild prostatic enlargement is noted.  Other: No abdominal wall hernia or abnormality. No abdominopelvic ascites. Musculoskeletal: No acute or significant osseous findings. Review of the MIP images confirms the above findings. IMPRESSION: 1. There is no evidence of thoracic or abdominal aortic dissection or aneurysm. 2. Mediastinal adenopathy is noted concerning for malignancy or metastatic disease. 3. Moderate size right pleural effusion is noted with associated atelectasis of the right lower lobe. There appears to be several pleural based masses in the right upper lung zone concerning for metastatic disease. 4. Large irregular opacity is noted posteriorly in the right upper lobe which may represent malignancy or associated postobstructive atelectasis. 5. 12 x  9 mm nodule is noted in right middle lobe concerning for metastatic disease. 6. Lytic lesion is noted in the manubrium consistent with metastatic disease. 7. Cholelithiasis. 8. Mild prostatic enlargement. 9. 2.8 cm right thyroid nodule is noted. In the setting of significant comorbidities or limited life expectancy, no follow-up recommended. (Ref: J Am Coll Radiol. 2015 Feb;12(2): 143-50). Electronically Signed   By: Marijo Conception M.D.   On: 03/30/2020 14:10    ____________________________________________   PROCEDURES  Procedure(s) performed (including Critical Care):  .1-3 Lead EKG Interpretation Performed by: Lucrezia Starch, MD Authorized by: Lucrezia Starch, MD     Interpretation: normal     ECG rate assessment: normal     Rhythm: sinus rhythm     Ectopy: none     Conduction: normal       ____________________________________________   INITIAL IMPRESSION / ASSESSMENT AND PLAN / ED COURSE      Patient presents with above-stated history exam for assessment of some chest pain that began last night in the setting approximately 3 weeks of worsening dyspnea with exertion.  On arrival patient is hypertensive with BP of 200/111 and slightly tachypneic otherwise stable  vital signs on room air.  He has markedly obese and has bilateral wheezing.  His abdomen is soft and he has no significant edema on his lower extremities although some discolored ration suggestive of some chronic venous stasis.  Differential includes but is not limited to ACS, PE, dissection, arrhythmia, symptomatic anemia, COPD exacerbation, pneumonia, pneumothorax, heart failure, and metabolic arrangements.  No history or exam findings or findings on chest x-ray to suggest acute traumatic injury.  History is less consistent with GI pathology.  Chest x-ray concerning for possible malignancy with rounded nodular opacities on the right superior hilar border and on the peritracheal stripe.  Patient also has a right pleural effusion.  Some very mild pleural edema.  Given description of pain rating to the shoulder and neck described intermittently as pressure versus sharp concern for dissection.  CTA chest abdomen pelvis obtained with no evidence of dissection aneurysm AAA pericardial effusion but does show evidence of likely multiple metastatic lesions in patient's chest.  No evidence of pneumonia or significant edema on CTA.  Right-sided pleural effusion associate with some atelectasis on the right could be contributing to his shortness of breath.  Also findings of cholelithiasis without evidence of cholecystitis and mild prostatic enlargement.  While initial troponin is upper limit of normal 18 repeat at 2 hours shows downtrend to 15.  Despite nonspecific changes on ECG of low suspicion for ACS or myocarditis at this time.  No significant arrhythmia identified.  CBC shows mild leukocytosis with hemoglobin of 19.1 and otherwise unremarkable.  CMP shows no significant electrolyte or metabolic derangements.  Procalcitonin is undetectable.  Lower suspicion for acute infectious process at this time.  Given wheezing concern for COPD exacerbation in setting of likely undiagnosed COPD and undiagnosed cancer with  unknown primary source of less likely symptomatic right-sided pleural effusion.  D-dimer is elevated at 3380 and ultrasound lower extremities obtained to assess for presence of DVT is to hold off on PE protocol at this time given recent contrast load.  Certainly reasonable for this to be ordered for next 12 to 24 hours for any persistent symptoms.  BNP is nonelevated and given absence of point edema on CTA chest suspicion for acute heart failure.  VBG shows no evidence of hypercarbic respiratory failure.  Covid is negative.  Patient treated  with duo nebs and steroids for concern for COPD exacerbation.  On my reassessment he was still tachypneic and wheezing audibly with SPO2 of 90% on room air on my reassessment.  I will plan to admit to the hospital service for evaluation of concern for COPD exacerbation and likely symptomatic pleural effusion.  ____________________________________________   FINAL CLINICAL IMPRESSION(S) / ED DIAGNOSES  Final diagnoses:  COPD exacerbation (HCC)  Pleural effusion  Tobacco abuse  Positive D dimer  Metastatic malignant neoplasm, unspecified site (HCC)    Medications  nitroGLYCERIN (NITROSTAT) SL tablet 0.4 mg (0.4 mg Sublingual Given 03/30/20 1048)  ipratropium-albuterol (DUONEB) 0.5-2.5 (3) MG/3ML nebulizer solution 9 mL (9 mLs Nebulization Given 03/30/20 1048)  labetalol (NORMODYNE) injection 10 mg (10 mg Intravenous Given 03/30/20 1046)  methylPREDNISolone sodium succinate (SOLU-MEDROL) 125 mg/2 mL injection 125 mg (125 mg Intravenous Given 03/30/20 1137)  iohexol (OMNIPAQUE) 350 MG/ML injection 125 mL (125 mLs Intravenous Contrast Given 03/30/20 1340)     ED Discharge Orders    None       Note:  This document was prepared using Dragon voice recognition software and may include unintentional dictation errors.   Lucrezia Starch, MD 03/30/20 [phone]

## 2020-03-30 NOTE — ED Notes (Signed)
Pt states that pain is less at this time, pt had labetolol and sl nitro and BP is lowered

## 2020-03-30 NOTE — ED Triage Notes (Signed)
Pt comes into the ED via POV c/o central chest pain that radiates into the neck and back.  Pt states he has also had increased SHOB but denies any swelling.  Pt denies any CHF but does have COPD.  Pt denies taking any BP medication.  Pt denies any nausea or dizziness. Pt ambulatory to triage and in NAD.

## 2020-03-30 NOTE — ED Notes (Signed)
Patient transported to CT 

## 2020-03-30 NOTE — Progress Notes (Signed)
Pt does not want to wear CPAP tonight. Pt RN aware. Pt aware that if he changes his mind, one can be brought over for him.

## 2020-03-30 NOTE — H&P (Signed)
History and Physical    PLEASE NOTE THAT [employer]AG[employer] SOFTWARE WAS USED IN THE CONSTRUCTION OF THIS NOTE.   David Johnson ZOX:[id number] DOB: Jan 27, 1946 DOA: 03/30/2020  PCP: Maryland Pink, MD Patient coming from: home   I have personally briefly reviewed patient's old medical records in [place]  Chief Complaint: chest pain  HPI: David Johnson is a 75 y.o. male with medical history significant for GERD, suspected COPD, obstructive sleep apnea compliant with home nocturnal CPAP, polycythemia, who is admitted to [facility] on 03/30/2020 with suspected acute COPD exacerbation, after presenting from home to [facility] complaining of chest pain.   The patient reports 1 day of intermittent substernal chest discomfort, which he describes as sharp in nature, with radiation to the neck as well as to the back.  He reports that this pain is nonexertional and nonpositional but does have an element of reproducibility with deep inspiration.  Denies any recent trauma or travel.  Denies any associated palpitations, diaphoresis, nausea, vomiting, dizziness, presyncope, or syncope.  Denies any associated cough, hemoptysis, calf tenderness, or new onset lower extremity erythema.  He also reports that this chest pain was preceded by 1 week of progressive shortness of breath associated with wheezing.  He denies any associated orthopnea, PND, or peripheral edema.  The onset of shortness of breath was coinciding or slightly preceded by rhinitis, rhinorrhea, which is subsequently improved.  He notes associated new onset nonproductive cough, without any associated hemoptysis.  The patient reports that he strongly suspects a history of underlying COPD, although this is not formally been diagnosed, in the setting of a long smoking history.  The patient reports that while he completely quit smoking in 2017, he previously smoked at least 1 pack/day for 35 to 40 years.  He  reports that he has been using his as needed albuterol inhaler more frequently over the last few days, but without any significant improvement in his shortness of breath with this measure.  Otherwise, denies any extension of a respiratory regimen as an outpatient.  Denies any recent abdominal pain, melena, or hematochezia.  Denies any known COVID-19 exposures.  Denies any personal history of underlying malignancy.     ED Course:  Vital signs in the ED were notable for the following: Temperature max 98.2; heart rate 70-94; initial blood pressure 188/83, which decreased to 146/62 following administration of duo nebulizer treatment as well as steroids, as further described below; respiratory rate 18-24, and oxygen saturation 93 to 97% on room air.  Labs were notable for the following: VBG showed 7.40/44.  CMP was notable for the following: Sodium 139, bicarbonate 26, creatinine 0.74.  CBC was notable for white blood cell count of 13,000, hemoglobin 19.1, platelets 201.  BNP 79, without any prior data point for point of comparison.  High-sensitivity troponin high was initially found to be 18, with repeat value noted to be trending down to 15.  Nasopharyngeal COVID-19 PCR was performed in the ED today, and found to be negative.  EKG has been ordered, with result currently pending.  Chest x-ray showed rounded nodular opacities projecting along the right superior hilar border with possible thickening of the right paratracheal stripe, also showing evidence of moderate right pleural effusion.  In the setting of pleuritic chest pain radiating to the back and associated with shortness of breath, CTA chest was obtained, and showed no evidence of acute pulmonary embolism as well as no evidence of aortic dissection or aneurysm.  However, CT chest showed mediastinal adenopathy concerning for malignancy or metastatic disease, also showing a moderate-sized right pleural effusion with associated atelectasis of the right  lower lobe, with several pleural-based masses in the right upper lobe concerning for metastatic disease.  CT chest also showed a large irregular density in the right upper lobe, potentially representing malignancy, also showing 12 x 9 mm nodule in the right lobe concerning for metastatic disease.  Finally, CTA chest also showed a lytic lesion in the manubrium consistent with metastatic disease.    While in the ED, the following were administered: Duo nebulizer treatment x1, Cymetra 125 mg IV x1, labetalol 10 mg IV x1.  Additionally, the patient received sublingual nitroglycerin x1 in the ED, without any ensuing improvement in his chest discomfort.  Subsequently, the patient was admitted for further evaluation and management of suspected presenting acute COPD exacerbation along with evaluation and management of right pleural effusion with associated right lower lobe atelectasis.    Review of Systems: As per HPI otherwise 10 point review of systems negative.   Past Medical History:  Diagnosis Date  . Adenomatous polyps   . Barrett's esophagus   . Colon polyp   . COPD (chronic obstructive pulmonary disease) ([place])    uses inhaler, "i may have COPD"  . Dyspnea   . Eye injury   . GERD (gastroesophageal reflux disease)   . History of gonorrhea   . History of pneumonia   . Obesity   . Pneumonia   . Reflux esophagitis   . Sleep apnea     Past Surgical History:  Procedure Laterality Date  . CATARACT EXTRACTION Left   . COLONOSCOPY    . COLONOSCOPY WITH PROPOFOL N/A 05/19/2015   Procedure: COLONOSCOPY WITH PROPOFOL;  Surgeon: [name], MD;  Location: [facility] ENDOSCOPY;  Service: Endoscopy;  Laterality: N/A;  . COLONOSCOPY WITH PROPOFOL N/A 09/11/2018   Procedure: COLONOSCOPY WITH PROPOFOL;  Surgeon: [name], MD;  Location: [facility] ENDOSCOPY;  Service: Endoscopy;  Laterality: N/A;  . ESOPHAGOGASTRODUODENOSCOPY (EGD) WITH PROPOFOL N/A 05/19/2015   Procedure:  ESOPHAGOGASTRODUODENOSCOPY (EGD) WITH PROPOFOL;  Surgeon: [name], MD;  Location: [facility] ENDOSCOPY;  Service: Endoscopy;  Laterality: N/A;  . ESOPHAGOGASTRODUODENOSCOPY (EGD) WITH PROPOFOL N/A 10/11/2016   Procedure: ESOPHAGOGASTRODUODENOSCOPY (EGD) WITH PROPOFOL;  Surgeon: [name], MD;  Location: [facility] ENDOSCOPY;  Service: Endoscopy;  Laterality: N/A;  . ESOPHAGOGASTRODUODENOSCOPY (EGD) WITH PROPOFOL N/A 01/01/2018   Procedure: ESOPHAGOGASTRODUODENOSCOPY (EGD) WITH PROPOFOL;  Surgeon: [name], MD;  Location: [facility] ENDOSCOPY;  Service: Endoscopy;  Laterality: N/A;  . ESOPHAGOGASTRODUODENOSCOPY (EGD) WITH PROPOFOL N/A 09/11/2018   Procedure: ESOPHAGOGASTRODUODENOSCOPY (EGD) WITH PROPOFOL;  Surgeon: [name], MD;  Location: [facility] ENDOSCOPY;  Service: Endoscopy;  Laterality: N/A;  . EYE SURGERY      Social History:  reports that he has been smoking cigarettes and cigars. He has a 52.50 pack-year smoking history. He has never used smokeless tobacco. He reports current alcohol use of about 8.0 standard drinks of alcohol per week. He reports that he does not use drugs.   Allergies  Allergen Reactions  . Cephalexin Rash    Family History  Problem Relation Age of Onset  . Emphysema Mother      Prior to Admission medications   Medication Sig Start Date End Date Taking? Authorizing Provider  albuterol (PROVENTIL HFA;VENTOLIN HFA) 108 (90 Base) MCG/ACT inhaler 2 puffs q.i.d. p.r.n. short of breath, wheezing, or cough 07/21/16  Yes [provider]  Objective    Physical Exam: Vitals:   03/30/20 1200 03/30/20 1300 03/30/20 1441 03/30/20 1500  BP: 140/65 (!) 146/61 (!) 162/68 (!) 146/62  Pulse: 85 (!) 58 69 70  Resp: (!) 24 (!) 21 (!) 22 (!) 26  Temp:    98.2 F (36.8 C)  TempSrc:    Oral  SpO2: 96% 93% 93% 93%  Weight:      Height:        General: appears to be stated age; alert, oriented; mildly increased work of breathing noted.   Skin: warm, dry, no rash Head:  AT/[place] Mouth:  Oral mucosa membranes appear moist, normal dentition Neck: supple; trachea midline Heart:  RRR; did not appreciate any M/R/G Lungs: Diminished bibasilar breath sounds; did not appreciate rales or rhonchi.  Abdomen: + BS; soft, ND, NT Vascular: 2+ pedal pulses b/l; 2+ radial pulses b/l Extremities: no peripheral edema, no muscle wasting Neuro: strength and sensation intact in upper and lower extremities b/l    Labs on Admission: I have personally reviewed following labs and imaging studies  CBC: Recent Labs  Lab 03/30/20 1035  WBC 13.3*  NEUTROABS 10.0*  HGB 19.1*  HCT 55.7*  MCV 91.8  PLT 381   Basic Metabolic Panel: Recent Labs  Lab 03/30/20 1035  NA 139  K 4.4  CL 105  CO2 26  GLUCOSE 116*  BUN 11  CREATININE 0.74  CALCIUM 9.2   GFR: Estimated Creatinine Clearance: 125.1 mL/min (by C-G formula based on SCr of 0.74 mg/dL). Liver Function Tests: Recent Labs  Lab 03/30/20 1035  AST 36  ALT 55*  ALKPHOS 66  BILITOT 0.9  PROT 7.2  ALBUMIN 3.9   No results for input(s): LIPASE, AMYLASE in the last 168 hours. No results for input(s): AMMONIA in the last 168 hours. Coagulation Profile: No results for input(s): INR, PROTIME in the last 168 hours. Cardiac Enzymes: No results for input(s): CKTOTAL, CKMB, CKMBINDEX, TROPONINI in the last 168 hours. BNP (last 3 results) No results for input(s): PROBNP in the last 8760 hours. HbA1C: No results for input(s): HGBA1C in the last 72 hours. CBG: No results for input(s): GLUCAP in the last 168 hours. Lipid Profile: No results for input(s): CHOL, HDL, LDLCALC, TRIG, CHOLHDL, LDLDIRECT in the last 72 hours. Thyroid Function Tests: No results for input(s): TSH, T4TOTAL, FREET4, T3FREE, THYROIDAB in the last 72 hours. Anemia Panel: No results for input(s): VITAMINB12, FOLATE, FERRITIN, TIBC, IRON, RETICCTPCT in the last 72 hours. Urine analysis: No results found for:  COLORURINE, APPEARANCEUR, [place], [place], GLUCOSEU, HGBUR, BILIRUBINUR, KETONESUR, PROTEINUR, UROBILINOGEN, NITRITE, LEUKOCYTESUR  Radiological Exams on Admission: DG Chest 2 View  Result Date: 03/30/2020 CLINICAL DATA:  Pain and stiffness EXAM: CHEST - 2 VIEW COMPARISON:  June 03, 2016 FINDINGS: Cardiomediastinal silhouette is enlarged in contour. There is a rounded nodular opacity projecting along the RIGHT superior hilar border. Possible thickening of the RIGHT paratracheal stripe. There is a moderate RIGHT pleural effusion. No significant LEFT pleural effusion. Homogeneous opacification of the RIGHT lower lung. Mild diffuse interstitial prominence. Visualized abdomen is unremarkable. Mild degenerative changes of the thoracic spine. IMPRESSION: 1. Rounded nodular opacity projecting along the RIGHT superior hilar border with possible thickening of the RIGHT paratracheal stripe. Consider further evaluation with dedicated cross-sectional imaging. 2. Moderate RIGHT pleural effusion. 3. Mild pulmonary edema. Homogeneous opacification of the RIGHT lower lung, most likely atelectasis. Electronically Signed   By: Valentino Saxon MD   On: 03/30/2020 10:28   US Venous  Img Lower Bilateral  Result Date: 03/30/2020 CLINICAL DATA:  Shortness of breath, elevated D-dimer level. EXAM: BILATERAL LOWER EXTREMITY VENOUS DOPPLER ULTRASOUND TECHNIQUE: Gray-scale sonography with graded compression, as well as color Doppler and duplex ultrasound were performed to evaluate the lower extremity deep venous systems from the level of the common femoral vein and including the common femoral, femoral, profunda femoral, popliteal and calf veins including the posterior tibial, peroneal and gastrocnemius veins when visible. The superficial great saphenous vein was also interrogated. Spectral Doppler was utilized to evaluate flow at rest and with distal augmentation maneuvers in the common femoral, femoral and popliteal veins.  COMPARISON:  November 26, 2013. FINDINGS: RIGHT LOWER EXTREMITY Common Femoral Vein: No evidence of thrombus. Normal compressibility, respiratory phasicity and response to augmentation. Saphenofemoral Junction: No evidence of thrombus. Normal compressibility and flow on color Doppler imaging. Profunda Femoral Vein: No evidence of thrombus. Normal compressibility and flow on color Doppler imaging. Femoral Vein: No evidence of thrombus. Normal compressibility, respiratory phasicity and response to augmentation. Popliteal Vein: No evidence of thrombus. Normal compressibility, respiratory phasicity and response to augmentation. Calf Veins: No evidence of thrombus. Normal compressibility and flow on color Doppler imaging. Superficial Great Saphenous Vein: No evidence of thrombus. Normal compressibility. Venous Reflux:  None. Other Findings:  None. LEFT LOWER EXTREMITY Common Femoral Vein: No evidence of thrombus. Normal compressibility, respiratory phasicity and response to augmentation. Saphenofemoral Junction: No evidence of thrombus. Normal compressibility and flow on color Doppler imaging. Profunda Femoral Vein: No evidence of thrombus. Normal compressibility and flow on color Doppler imaging. Femoral Vein: No evidence of thrombus. Normal compressibility, respiratory phasicity and response to augmentation. Popliteal Vein: No evidence of thrombus. Normal compressibility, respiratory phasicity and response to augmentation. Calf Veins: No evidence of thrombus. Normal compressibility and flow on color Doppler imaging. Superficial Great Saphenous Vein: No evidence of thrombus. Normal compressibility. Venous Reflux:  None. Other Findings:  None. IMPRESSION: No evidence of deep venous thrombosis in either lower extremity. Electronically Signed   By: Marijo Conception M.D.   On: 03/30/2020 15:40   CT Angio Chest/Abd/Pel for Dissection W and/or Wo Contrast  Result Date: 03/30/2020 CLINICAL DATA:  Chest and back pain. EXAM: CT  ANGIOGRAPHY CHEST, ABDOMEN AND PELVIS TECHNIQUE: Non-contrast CT of the chest was initially obtained. Multidetector CT imaging through the chest, abdomen and pelvis was performed using the standard protocol during bolus administration of intravenous contrast. Multiplanar reconstructed images and MIPs were obtained and reviewed to evaluate the vascular anatomy. CONTRAST:  122mL OMNIPAQUE IOHEXOL 350 MG/ML SOLN COMPARISON:  None. FINDINGS: CTA CHEST FINDINGS Cardiovascular: Preferential opacification of the thoracic aorta. No evidence of thoracic aortic aneurysm or dissection. Normal heart size. No pericardial effusion. Mediastinum/Nodes: The esophagus is unremarkable. 6.4 x 3.7 cm right paratracheal adenopathy is noted concerning for malignancy or metastatic disease. 1.9 cm right hilar lymph node is noted. 12 mm anterior mediastinal lymph node is noted concerning for metastatic disease. 2.8 cm right thyroid nodule is noted. Lungs/Pleura: No pneumothorax is noted. Left lung is clear. Moderate size right pleural effusion is noted with associated atelectasis of the right lower lobe. Several pleural-based masses are noted posteriorly concerning for metastatic disease. Large irregular opacity is noted posteriorly in the right upper lobe which may represent malignancy or associated postobstructive atelectasis. 12 x 9 mm nodule is noted in right middle lobe concerning for metastatic disease. Musculoskeletal: Lytic lesion is noted in the manubrium consistent with metastatic disease. Review of the MIP images confirms the above findings.  CTA ABDOMEN AND PELVIS FINDINGS VASCULAR Aorta: Normal caliber aorta without aneurysm, dissection, vasculitis or significant stenosis. Celiac: Patent without evidence of aneurysm, dissection, vasculitis or significant stenosis. SMA: Patent without evidence of aneurysm, dissection, vasculitis or significant stenosis. Renals: Both renal arteries are patent without evidence of aneurysm,  dissection, vasculitis, fibromuscular dysplasia or significant stenosis. IMA: Patent without evidence of aneurysm, dissection, vasculitis or significant stenosis. Inflow: Patent without evidence of aneurysm, dissection, vasculitis or significant stenosis. Veins: No obvious venous abnormality within the limitations of this arterial phase study. Review of the MIP images confirms the above findings. NON-VASCULAR Hepatobiliary: Cholelithiasis is noted. No biliary dilatation is noted. The liver is unremarkable. Pancreas: Unremarkable. No pancreatic ductal dilatation or surrounding inflammatory changes. Spleen: Normal in size without focal abnormality. Adrenals/Urinary Tract: Adrenal glands are unremarkable. Kidneys are normal, without renal calculi, focal lesion, or hydronephrosis. Bladder is unremarkable. Stomach/Bowel: Stomach is within normal limits. Appendix appears normal. No evidence of bowel wall thickening, distention, or inflammatory changes. Lymphatic: No significant adenopathy is noted in the abdomen or pelvis. Reproductive: Mild prostatic enlargement is noted. Other: No abdominal wall hernia or abnormality. No abdominopelvic ascites. Musculoskeletal: No acute or significant osseous findings. Review of the MIP images confirms the above findings. IMPRESSION: 1. There is no evidence of thoracic or abdominal aortic dissection or aneurysm. 2. Mediastinal adenopathy is noted concerning for malignancy or metastatic disease. 3. Moderate size right pleural effusion is noted with associated atelectasis of the right lower lobe. There appears to be several pleural based masses in the right upper lung zone concerning for metastatic disease. 4. Large irregular opacity is noted posteriorly in the right upper lobe which may represent malignancy or associated postobstructive atelectasis. 5. 12 x 9 mm nodule is noted in right middle lobe concerning for metastatic disease. 6. Lytic lesion is noted in the manubrium consistent  with metastatic disease. 7. Cholelithiasis. 8. Mild prostatic enlargement. 9. 2.8 cm right thyroid nodule is noted. In the setting of significant comorbidities or limited life expectancy, no follow-up recommended. (Ref: J Am Coll Radiol. 2015 Feb;12(2): 143-50). Electronically Signed   By: Marijo Conception M.D.   On: 03/30/2020 14:10     Assessment/Plan   David Johnson is a 75 y.o. male with medical history significant for GERD, suspected COPD, obstructive sleep apnea compliant with home nocturnal CPAP, polycythemia, who is admitted to [facility] on 03/30/2020 with suspected acute COPD exacerbation, after presenting from home to [facility] complaining of chest pain.    Principal Problem:   Acute exacerbation of chronic obstructive pulmonary disease (COPD) (HCC) Active Problems:   OSA (obstructive sleep apnea)   Atypical chest pain   Metastatic disease (HCC)   Pleural effusion, right   SOB (shortness of breath)    #) Acute COPD exacerbation: Diagnosis on the basis of 7 to 10 days of progressive shortness of breath associated with wheezing, with increased work of breathing noted presentation.  Suspect this may be multifactorial in nature, with potential contribution from preceding viral upper respiratory infection given the patient reports that preceding rhinitis/rhinorrhea that has subsequently resolved.  Of note, nasopharyngeal COVID-19 PCR testing performed in the ED today was found to be negative.  While CTA chest shows no evidence of pneumonia, PE, aortic dissection, or evidence of acutely decompensated heart failure.  This is in the context of a suspected history of COPD given patient's history of greater than 50-pack-year smoking history, although it does not appear that a formal diagnosis  of copd has been established previously.  Other potential factors contributing to acute COPD exacerbation include several findings identified on CTA chest that are  suggestive of disease, including large irregular opacity in the right upper lobe, right middle lobe nodule, mediastinal adenopathy.  Additionally, suspect contributing factor moderate-sized right pleural effusion with associated atelectasis of the right lower lobe, as further described below.  The patient confirms that he completely quit smoking back in 2017, without subsequent resumption.  Plan: Solu-Medrol 40 mg IV hours.  Duo nebulizer treatment scheduled every 6 hours.  As needed albuterol inhaler.  Start azithromycin 5 mg IV daily for benefit of reduction and duration of hospitalization in the setting of acute COPD exacerbation requiring hospitalization.  Monitor continuous pulse oximetry and monitor on telemetry.  Add on serum magnesium and phosphorus levels.  Repeat CBC with differential.  Work-up and management of right pleural effusion, as further described below.     #) Atypical chest pain: 1 day of nonexertional, pleuritic substernal chest discomfort, that is unchanged with use of sublingual nitroglycerin, thereby meeting criteria the atypical nature.  ACS is felt to be unlikely given negative troponin values, although EKG result is currently pending, and will require follow-up of such.  Suspect that patient's presenting chest pain is likely multifactorial, with musculoskeletal as well as pleuritic contributions stemming from CTA evidence of a lytic lesion of the manubrium as well as suspected pleuritic contributions from evidence of multiple newly noted findings on CTA chest that include right middle lobe lung nodule, large irregular right upper lobe opacity, and mediastinal adenopathy, all concerning for metastatic disease.  Of important note, CTA chest showed no evidence of acute pulmonary embolism and also showed no evidence of aortic dissection or aneurysm.  Chest x-ray showed no evidence of pneumothorax.   Plan: Will require ultimately additional work-up for suspected metastatic disease, as  further described below.  As needed IV Toradol.  Monitor on telemetry.  We will follow for result of presenting EKG.  We will also check a third set of cardiac enzymes in the morning.      #) Right pleural effusion: CTA chest shows evidence of a moderate-sized right pleural effusion associated with atelectasis of the right lower lobe, representing a new diagnosis for the patient.  Concerning given additional radiographic findings suggestive of metastatic disease, as further described above.  Suspect contribution to presenting shortness of breath ensuing acute COPD exacerbation relating to this moderate-sized right pleural effusion, particular in the setting of apparent sequela atelectasis of the right lower lobe.  Consequently, will try to pursue thoracentesis for symptomatic as well as diagnostic purposes.  Not on any blood thinners at home, including aspirin.    Plan: Right-Pleuritic fluid analysis has been ordered for application at time of intended ensuing right thoracentesis.  These pleural fluid studies including the following: Culture and Gram stain, cell count with differential, cytology, LDH, amylase, glucose, protein, triglyceride, pH. check serum LDH for further evaluation via lights criteria.  CMP tomorrow, including for serum total protein..  Monitor continuous pulse oximetry monitor on telemetry.  Check INR. Will consult IR for diagnostic/therapeutic right-sided thoracentesis.      #) Metastatic disease: Presenting CTA chest concerning for metastatic disease, including evidence of large irregular opacity in the right upper lobe, right middle lobe nodule, lytic lesion of the manubrium, mediastinal adenopathy as well as several pleural-based masses in the right upper lobe consistent with metastatic disease.  No known history of underlying malignancy.  The patient has been  informed of these radiographic findings as well as that they likely represent underlying metastatic disease.  Will  ultimately need further imaging to evaluate the extent of the metastatic spread as well as identification of the primary source of malignancy.  In the absence of these studies, we will refrain from referral to oncology at this time.  We will pursue diagnostic thoracentesis right pleural effusion, including evaluation of ensuing pleural fluid via cytology evaluation, as further described above.   Plan: Evaluation of right pleural effusion, as further described above.  Consideration for further imaging assess metastatic spread, as above.  Will ultimately need referral to oncology, although preceding work-up for such does not appear complete at this time, as described above.  CMP in the morning.  Check serum LDH level.      #) Obstructive sleep apnea: Patient reports good compliance with his home nocturnal CPAP.  Plan: I have placed an order with respiratory therapist to arrange for nocturnal CPAP during this hospitalization.     DVT prophylaxis: Lovenox 40 mg sq qdaily  Code Status: Full code Family Communication: none Disposition Plan: Per Rounding Team Admission status: Inpatient; med telemetry.     Of note, this patient was added by me to the following Admit List/Treatment Team:  armcadmits     PLEASE NOTE THAT [employer]AGON DICTATION SOFTWARE WAS USED IN THE CONSTRUCTION OF THIS NOTE.   [facility] Pager [phone] From 12PM- 12AM  Otherwise, please contact night-coverage  [email or web address] Password [facility]  03/30/2020, 4:13 PM

## 2020-03-31 ENCOUNTER — Inpatient Hospital Stay: Payer: Managed Care, Other (non HMO)

## 2020-03-31 DIAGNOSIS — J441 Chronic obstructive pulmonary disease with (acute) exacerbation: Secondary | ICD-10-CM | POA: Diagnosis not present

## 2020-03-31 LAB — COMPREHENSIVE METABOLIC PANEL
ALT: 48 U/L — ABNORMAL HIGH (ref 0–44)
AST: 31 U/L (ref 15–41)
Albumin: 3.7 g/dL (ref 3.5–5.0)
Alkaline Phosphatase: 68 U/L (ref 38–126)
Anion gap: 13 (ref 5–15)
BUN: 17 mg/dL (ref 8–23)
CO2: 22 mmol/L (ref 22–32)
Calcium: 9.2 mg/dL (ref 8.9–10.3)
Chloride: 102 mmol/L (ref 98–111)
Creatinine, Ser: 0.82 mg/dL (ref 0.61–1.24)
GFR, Estimated: >60 mL/min (ref >60)
Glucose, Bld: 166 mg/dL — ABNORMAL HIGH (ref 70–99)
Potassium: 4.2 mmol/L (ref 3.5–5.1)
Sodium: 137 mmol/L (ref 135–145)
Total Bilirubin: 0.7 mg/dL (ref 0.3–1.2)
Total Protein: 7 g/dL (ref 6.5–8.1)

## 2020-03-31 LAB — PROTEIN, PLEURAL OR PERITONEAL FLUID
Total protein, fluid: 4.7 g/dL
Total protein, fluid: 4.4 g/dL

## 2020-03-31 LAB — BODY FLUID CELL COUNT WITH DIFFERENTIAL
Eos, Fluid: 1 %
Lymphs, Fluid: 46 %
Monocyte-Macrophage-Serous Fluid: 31 %
Neutrophil Count, Fluid: 22 %
Other Cells, Fluid: 0 %
Total Nucleated Cell Count, Fluid: 994 cu mm

## 2020-03-31 LAB — ALBUMIN, PLEURAL OR PERITONEAL FLUID: Albumin, Fluid: 2.8 g/dL

## 2020-03-31 LAB — CBC WITH DIFFERENTIAL/PLATELET
Abs Immature Granulocytes: 0.13 10*3/uL — ABNORMAL HIGH (ref 0.00–0.07)
Basophils Absolute: 0.1 10*3/uL (ref 0.0–0.1)
Basophils Relative: 0 %
Eosinophils Absolute: 0 10*3/uL (ref 0.0–0.5)
Eosinophils Relative: 0 %
HCT: 55.9 % — ABNORMAL HIGH (ref 39.0–52.0)
Hemoglobin: 18.8 g/dL — ABNORMAL HIGH (ref 13.0–17.0)
Immature Granulocytes: 1 %
Lymphocytes Relative: 9 %
Lymphs Abs: 1.5 10*3/uL (ref 0.7–4.0)
MCH: 31 pg (ref 26.0–34.0)
MCHC: 33.6 g/dL (ref 30.0–36.0)
MCV: 92.1 fL (ref 80.0–100.0)
Monocytes Absolute: 0.6 10*3/uL (ref 0.1–1.0)
Monocytes Relative: 3 %
Neutro Abs: 15.2 10*3/uL — ABNORMAL HIGH (ref 1.7–7.7)
Neutrophils Relative %: 87 %
Platelets: 212 10*3/uL (ref 150–400)
RBC: 6.07 MIL/uL — ABNORMAL HIGH (ref 4.22–5.81)
RDW: 13.6 % (ref 11.5–15.5)
WBC: 17.4 10*3/uL — ABNORMAL HIGH (ref 4.0–10.5)
nRBC: 0 % (ref 0.0–0.2)

## 2020-03-31 LAB — LACTATE DEHYDROGENASE, PLEURAL OR PERITONEAL FLUID: LD, Fluid: 475 U/L — ABNORMAL HIGH (ref 3–23)

## 2020-03-31 LAB — GLUCOSE, PLEURAL OR PERITONEAL FLUID: Glucose, Fluid: 163 mg/dL

## 2020-03-31 LAB — PROTIME-INR
INR: 1 (ref 0.8–1.2)
Prothrombin Time: 12.4 seconds (ref 11.4–15.2)

## 2020-03-31 LAB — MAGNESIUM: Magnesium: 2.2 mg/dL (ref 1.7–2.4)

## 2020-03-31 LAB — TROPONIN I (HIGH SENSITIVITY): Troponin I (High Sensitivity): 13 ng/L (ref <18)

## 2020-03-31 LAB — PATHOLOGIST SMEAR REVIEW

## 2020-03-31 LAB — LACTATE DEHYDROGENASE: LDH: 333 U/L — ABNORMAL HIGH (ref 98–192)

## 2020-03-31 LAB — PHOSPHORUS: Phosphorus: 3.1 mg/dL (ref 2.5–4.6)

## 2020-03-31 MED ORDER — LORAZEPAM 2 MG/ML IJ SOLN
1.0000 mg | Freq: Once | INTRAMUSCULAR | Status: AC
  Administered 2020-04-01: 1 mg via INTRAVENOUS
  Filled 2020-03-31: qty 1

## 2020-03-31 MED ORDER — IPRATROPIUM-ALBUTEROL 0.5-2.5 (3) MG/3ML IN SOLN: 3.0000 mL | Freq: Four times a day (QID) | RESPIRATORY_TRACT | Status: DC | PRN

## 2020-03-31 NOTE — Consult Note (Signed)
Hematology/Oncology Consult note [facility] [phone] [phone]  Patient Care Team: [place] Pink, MD as PCP - General (Family Medicine)   Name of the patient: David Johnson  [id number]  02-08-1946    Reason for consult: Lung mass and mediastinal adenopathy   Requesting physician: Dr. Enzo Bi  Date of visit: 03/31/2020    History of presenting illness- Patient is a 75 year old male who was last seen by me back in 2018 for polycythemia secondary to smoking he presented to the hospital with symptoms of exertional shortness of breath and chest pain.  Past medical history significant for COPD, GERD and obstructive sleep apnea.  CT angio chest abdomen pelvis showed 6.4 x 3.7 cm right paratracheal adenopathy concerning for malignancy.  1.9 cm right hilar node.  12 mm anterior mediastinal lymph node.  2.8 cm right thyroid nodule.  Several pleural-based masses concerning for metastatic disease.  Large irregular opacity in the right upper lobe which may be malignancy versus postobstructive atelectasis.  Lytic lesion noted in the manubrium consistent with metastatic disease.  Moderate sides right pleural effusion.  Patient has undergone thoracentesis today and cytology is currently pending.    Patient reports his symptoms of chest pain have resolved. Breathing is at his baseline. He lives with his wife.   ECOG PS- 2  Pain scale- 0   Review of systems- Review of Systems  Constitutional: Positive for malaise/fatigue. Negative for chills, fever and weight loss.  HENT: Negative for congestion, ear discharge and nosebleeds.   Eyes: Negative for blurred vision.  Respiratory: Positive for shortness of breath. Negative for cough, hemoptysis, sputum production and wheezing.   Cardiovascular: Negative for chest pain, palpitations, orthopnea and claudication.  Gastrointestinal: Negative for abdominal pain, blood in stool, constipation, diarrhea, heartburn,  melena, nausea and vomiting.  Genitourinary: Negative for dysuria, flank pain, frequency, hematuria and urgency.  Musculoskeletal: Negative for back pain, joint pain and myalgias.  Skin: Negative for rash.  Neurological: Negative for dizziness, tingling, focal weakness, seizures, weakness and headaches.  Endo/Heme/Allergies: Does not bruise/bleed easily.  Psychiatric/Behavioral: Negative for depression and suicidal ideas. The patient does not have insomnia.     Allergies  Allergen Reactions  . Cephalexin Rash    Patient Active Problem List   Diagnosis Date Noted  . Acute exacerbation of chronic obstructive pulmonary disease (COPD) ([place]) 03/30/2020  . Atypical chest pain 03/30/2020  . Metastatic disease ([place]) 03/30/2020  . Pleural effusion, right 03/30/2020  . SOB (shortness of breath) 03/30/2020  . PAD (peripheral artery disease) ([place]) 11/24/2017  . GERD (gastroesophageal reflux disease) 11/24/2017  . Polycythemia 06/26/2016  . Obesity 06/03/2016  . OSA (obstructive sleep apnea) 06/03/2016     Past Medical History:  Diagnosis Date  . Adenomatous polyps   . Barrett's esophagus   . Colon polyp   . COPD (chronic obstructive pulmonary disease) ([place])    uses inhaler, "i may have COPD"  . Dyspnea   . Eye injury   . GERD (gastroesophageal reflux disease)   . History of gonorrhea   . History of pneumonia   . Obesity   . Pneumonia   . Reflux esophagitis   . Sleep apnea      Past Surgical History:  Procedure Laterality Date  . CATARACT EXTRACTION Left   . COLONOSCOPY    . COLONOSCOPY WITH PROPOFOL N/A 05/19/2015   Procedure: COLONOSCOPY WITH PROPOFOL;  Surgeon: [name], MD;  Location: [facility] ENDOSCOPY;  Service: Endoscopy;  Laterality: N/A;  .  COLONOSCOPY WITH PROPOFOL N/A 09/11/2018   Procedure: COLONOSCOPY WITH PROPOFOL;  Surgeon: [name], MD;  Location: [facility] ENDOSCOPY;  Service: Endoscopy;  Laterality: N/A;  . ESOPHAGOGASTRODUODENOSCOPY (EGD) WITH  PROPOFOL N/A 05/19/2015   Procedure: ESOPHAGOGASTRODUODENOSCOPY (EGD) WITH PROPOFOL;  Surgeon: [name], MD;  Location: [facility] ENDOSCOPY;  Service: Endoscopy;  Laterality: N/A;  . ESOPHAGOGASTRODUODENOSCOPY (EGD) WITH PROPOFOL N/A 10/11/2016   Procedure: ESOPHAGOGASTRODUODENOSCOPY (EGD) WITH PROPOFOL;  Surgeon: [name], MD;  Location: [facility] ENDOSCOPY;  Service: Endoscopy;  Laterality: N/A;  . ESOPHAGOGASTRODUODENOSCOPY (EGD) WITH PROPOFOL N/A 01/01/2018   Procedure: ESOPHAGOGASTRODUODENOSCOPY (EGD) WITH PROPOFOL;  Surgeon: [name], MD;  Location: [facility] ENDOSCOPY;  Service: Endoscopy;  Laterality: N/A;  . ESOPHAGOGASTRODUODENOSCOPY (EGD) WITH PROPOFOL N/A 09/11/2018   Procedure: ESOPHAGOGASTRODUODENOSCOPY (EGD) WITH PROPOFOL;  Surgeon: [name], MD;  Location: [facility] ENDOSCOPY;  Service: Endoscopy;  Laterality: N/A;  . EYE SURGERY      Social History   Socioeconomic History  . Marital status: Married    Spouse name: Not on file  . Number of children: Not on file  . Years of education: Not on file  . Highest education level: Not on file  Occupational History  . Not on file  Tobacco Use  . Smoking status: Current Every Day Smoker    Packs/day: 1.50    Years: 35.00    Pack years: 52.50    Types: Cigarettes, Cigars    Last attempt to quit: 03/04/2015    Years since quitting: 5.0  . Smokeless tobacco: Never Used  Vaping Use  . Vaping Use: Every day  . Substances: Flavoring  Substance and Sexual Activity  . Alcohol use: Yes    Alcohol/week: 8.0 standard drinks    Types: 8 Cans of beer per week    Comment: Occassionally on weekends   . Drug use: No  . Sexual activity: Not on file  Other Topics Concern  . Not on file  Social History Narrative  . Not on file   Social Determinants of Health   Financial Resource Strain: Not on file  Food Insecurity: Not on file  Transportation Needs: Not on file  Physical Activity: Not on file  Stress: Not on file   Social Connections: Not on file  Intimate Partner Violence: Not on file     Family History  Problem Relation Age of Onset  . Emphysema Mother      Current Facility-Administered Medications:  .  acetaminophen (TYLENOL) tablet 650 mg, 650 mg, Oral, Q6H PRN **OR** acetaminophen (TYLENOL) suppository 650 mg, 650 mg, Rectal, Q6H PRN, Howerter, Justin B, DO .  albuterol (VENTOLIN HFA) 108 (90 Base) MCG/ACT inhaler 1-2 puff, 1-2 puff, Inhalation, Q4H PRN, Howerter, Justin B, DO .  ipratropium-albuterol (DUONEB) 0.5-2.5 (3) MG/3ML nebulizer solution 3 mL, 3 mL, Nebulization, Q6H PRN, Howerter, Justin B, DO .  ketorolac (TORADOL) 15 MG/ML injection 15 mg, 15 mg, Intravenous, Q6H PRN, Howerter, Justin B, DO .  nitroGLYCERIN (NITROSTAT) SL tablet 0.4 mg, 0.4 mg, Sublingual, Q5 min PRN, Howerter, Justin B, DO, 0.4 mg at 03/30/20 1048   Physical exam:  Vitals:   03/31/20 1254 03/31/20 1302 03/31/20 1431 03/31/20 1546  BP: 134/81 (!) 147/66  (!) 161/70  Pulse: (!) 110 84  65  Resp:    16  Temp:    97.8 F (36.6 C)  TempSrc:      SpO2: 93% 94% 91% 95%  Weight:      Height:  Physical Exam Constitutional:      Appearance: He is obese.  Eyes:     Extraocular Movements: EOM normal.  Cardiovascular:     Rate and Rhythm: Normal rate and regular rhythm.     Heart sounds: Normal heart sounds.  Pulmonary:     Comments: Effort of breathing increased Abdominal:     Comments: Obese, NT  Skin:    General: Skin is warm and dry.  Neurological:     Mental Status: He is alert and oriented to person, place, and time.        CMP Latest Ref Rng & Units 03/31/2020  Glucose 70 - 99 mg/dL 166(H)  BUN 8 - 23 mg/dL 17  Creatinine 0.61 - 1.24 mg/dL 0.82  Sodium 135 - 145 mmol/L 137  Potassium 3.5 - 5.1 mmol/L 4.2  Chloride 98 - 111 mmol/L 102  CO2 22 - 32 mmol/L 22  Calcium 8.9 - 10.3 mg/dL 9.2  Total Protein 6.5 - 8.1 g/dL 7.0  Total Bilirubin 0.3 - 1.2 mg/dL 0.7  Alkaline Phos 38 - 126  U/L 68  AST 15 - 41 U/L 31  ALT 0 - 44 U/L 48(H)   CBC Latest Ref Rng & Units 03/31/2020  WBC 4.0 - 10.5 K/uL 17.4(H)  Hemoglobin 13.0 - 17.0 g/dL 18.8(H)  Hematocrit 39.0 - 52.0 % 55.9(H)  Platelets 150 - 400 K/uL 212    @IMAGES @  DG Chest 2 View  Result Date: 03/30/2020 CLINICAL DATA:  Pain and stiffness EXAM: CHEST - 2 VIEW COMPARISON:  June 03, 2016 FINDINGS: Cardiomediastinal silhouette is enlarged in contour. There is a rounded nodular opacity projecting along the RIGHT superior hilar border. Possible thickening of the RIGHT paratracheal stripe. There is a moderate RIGHT pleural effusion. No significant LEFT pleural effusion. Homogeneous opacification of the RIGHT lower lung. Mild diffuse interstitial prominence. Visualized abdomen is unremarkable. Mild degenerative changes of the thoracic spine. IMPRESSION: 1. Rounded nodular opacity projecting along the RIGHT superior hilar border with possible thickening of the RIGHT paratracheal stripe. Consider further evaluation with dedicated cross-sectional imaging. 2. Moderate RIGHT pleural effusion. 3. Mild pulmonary edema. Homogeneous opacification of the RIGHT lower lung, most likely atelectasis. Electronically Signed   By: Valentino Saxon MD   On: 03/30/2020 10:28   US Venous Img Lower Bilateral  Result Date: 03/30/2020 CLINICAL DATA:  Shortness of breath, elevated D-dimer level. EXAM: BILATERAL LOWER EXTREMITY VENOUS DOPPLER ULTRASOUND TECHNIQUE: Gray-scale sonography with graded compression, as well as color Doppler and duplex ultrasound were performed to evaluate the lower extremity deep venous systems from the level of the common femoral vein and including the common femoral, femoral, profunda femoral, popliteal and calf veins including the posterior tibial, peroneal and gastrocnemius veins when visible. The superficial great saphenous vein was also interrogated. Spectral Doppler was utilized to evaluate flow at rest and with distal  augmentation maneuvers in the common femoral, femoral and popliteal veins. COMPARISON:  November 26, 2013. FINDINGS: RIGHT LOWER EXTREMITY Common Femoral Vein: No evidence of thrombus. Normal compressibility, respiratory phasicity and response to augmentation. Saphenofemoral Junction: No evidence of thrombus. Normal compressibility and flow on color Doppler imaging. Profunda Femoral Vein: No evidence of thrombus. Normal compressibility and flow on color Doppler imaging. Femoral Vein: No evidence of thrombus. Normal compressibility, respiratory phasicity and response to augmentation. Popliteal Vein: No evidence of thrombus. Normal compressibility, respiratory phasicity and response to augmentation. Calf Veins: No evidence of thrombus. Normal compressibility and flow on color Doppler imaging. Superficial Great Saphenous Vein:  No evidence of thrombus. Normal compressibility. Venous Reflux:  None. Other Findings:  None. LEFT LOWER EXTREMITY Common Femoral Vein: No evidence of thrombus. Normal compressibility, respiratory phasicity and response to augmentation. Saphenofemoral Junction: No evidence of thrombus. Normal compressibility and flow on color Doppler imaging. Profunda Femoral Vein: No evidence of thrombus. Normal compressibility and flow on color Doppler imaging. Femoral Vein: No evidence of thrombus. Normal compressibility, respiratory phasicity and response to augmentation. Popliteal Vein: No evidence of thrombus. Normal compressibility, respiratory phasicity and response to augmentation. Calf Veins: No evidence of thrombus. Normal compressibility and flow on color Doppler imaging. Superficial Great Saphenous Vein: No evidence of thrombus. Normal compressibility. Venous Reflux:  None. Other Findings:  None. IMPRESSION: No evidence of deep venous thrombosis in either lower extremity. Electronically Signed   By: Marijo Conception M.D.   On: 03/30/2020 15:40   DG Chest Port 1 View  Result Date: 03/31/2020 CLINICAL  DATA:  Right pleural effusion, status post thoracentesis EXAM: PORTABLE CHEST 1 VIEW COMPARISON:  03/30/2020 FINDINGS: Interval decrease in volume of a moderate right pleural effusion, now small, with associated atelectasis or consolidation. Redemonstrated mass of the suprahilar right lung. Cardiomegaly. IMPRESSION: 1. Interval decrease in volume of a moderate right pleural effusion, now small, with associated atelectasis or consolidation. No pneumothorax. 2. Redemonstrated mass of the suprahilar right lung. Electronically Signed   By: Eddie Candle M.D.   On: 03/31/2020 13:37   CT Angio Chest/Abd/Pel for Dissection W and/or Wo Contrast  Result Date: 03/30/2020 CLINICAL DATA:  Chest and back pain. EXAM: CT ANGIOGRAPHY CHEST, ABDOMEN AND PELVIS TECHNIQUE: Non-contrast CT of the chest was initially obtained. Multidetector CT imaging through the chest, abdomen and pelvis was performed using the standard protocol during bolus administration of intravenous contrast. Multiplanar reconstructed images and MIPs were obtained and reviewed to evaluate the vascular anatomy. CONTRAST:  183mL OMNIPAQUE IOHEXOL 350 MG/ML SOLN COMPARISON:  None. FINDINGS: CTA CHEST FINDINGS Cardiovascular: Preferential opacification of the thoracic aorta. No evidence of thoracic aortic aneurysm or dissection. Normal heart size. No pericardial effusion. Mediastinum/Nodes: The esophagus is unremarkable. 6.4 x 3.7 cm right paratracheal adenopathy is noted concerning for malignancy or metastatic disease. 1.9 cm right hilar lymph node is noted. 12 mm anterior mediastinal lymph node is noted concerning for metastatic disease. 2.8 cm right thyroid nodule is noted. Lungs/Pleura: No pneumothorax is noted. Left lung is clear. Moderate size right pleural effusion is noted with associated atelectasis of the right lower lobe. Several pleural-based masses are noted posteriorly concerning for metastatic disease. Large irregular opacity is noted posteriorly in  the right upper lobe which may represent malignancy or associated postobstructive atelectasis. 12 x 9 mm nodule is noted in right middle lobe concerning for metastatic disease. Musculoskeletal: Lytic lesion is noted in the manubrium consistent with metastatic disease. Review of the MIP images confirms the above findings. CTA ABDOMEN AND PELVIS FINDINGS VASCULAR Aorta: Normal caliber aorta without aneurysm, dissection, vasculitis or significant stenosis. Celiac: Patent without evidence of aneurysm, dissection, vasculitis or significant stenosis. SMA: Patent without evidence of aneurysm, dissection, vasculitis or significant stenosis. Renals: Both renal arteries are patent without evidence of aneurysm, dissection, vasculitis, fibromuscular dysplasia or significant stenosis. IMA: Patent without evidence of aneurysm, dissection, vasculitis or significant stenosis. Inflow: Patent without evidence of aneurysm, dissection, vasculitis or significant stenosis. Veins: No obvious venous abnormality within the limitations of this arterial phase study. Review of the MIP images confirms the above findings. NON-VASCULAR Hepatobiliary: Cholelithiasis is noted. No biliary dilatation is  noted. The liver is unremarkable. Pancreas: Unremarkable. No pancreatic ductal dilatation or surrounding inflammatory changes. Spleen: Normal in size without focal abnormality. Adrenals/Urinary Tract: Adrenal glands are unremarkable. Kidneys are normal, without renal calculi, focal lesion, or hydronephrosis. Bladder is unremarkable. Stomach/Bowel: Stomach is within normal limits. Appendix appears normal. No evidence of bowel wall thickening, distention, or inflammatory changes. Lymphatic: No significant adenopathy is noted in the abdomen or pelvis. Reproductive: Mild prostatic enlargement is noted. Other: No abdominal wall hernia or abnormality. No abdominopelvic ascites. Musculoskeletal: No acute or significant osseous findings. Review of the MIP  images confirms the above findings. IMPRESSION: 1. There is no evidence of thoracic or abdominal aortic dissection or aneurysm. 2. Mediastinal adenopathy is noted concerning for malignancy or metastatic disease. 3. Moderate size right pleural effusion is noted with associated atelectasis of the right lower lobe. There appears to be several pleural based masses in the right upper lung zone concerning for metastatic disease. 4. Large irregular opacity is noted posteriorly in the right upper lobe which may represent malignancy or associated postobstructive atelectasis. 5. 12 x 9 mm nodule is noted in right middle lobe concerning for metastatic disease. 6. Lytic lesion is noted in the manubrium consistent with metastatic disease. 7. Cholelithiasis. 8. Mild prostatic enlargement. 9. 2.8 cm right thyroid nodule is noted. In the setting of significant comorbidities or limited life expectancy, no follow-up recommended. (Ref: J Am Coll Radiol. 2015 Feb;12(2): 143-50). Electronically Signed   By: Marijo Conception M.D.   On: 03/30/2020 14:10   US THORACENTESIS ASP PLEURAL SPACE W/IMG GUIDE  Result Date: 03/31/2020 INDICATION: Patient with newly discovered pleural-based masses and right pleural effusion presents today for therapeutic and diagnostic thoracentesis. EXAM: ULTRASOUND GUIDED THORACENTESIS MEDICATIONS: 1% lidocaine 10 mL COMPLICATIONS: None immediate. PROCEDURE: An ultrasound guided thoracentesis was thoroughly discussed with the patient and questions answered. The benefits, risks, alternatives and complications were also discussed. The patient understands and wishes to proceed with the procedure. Written consent was obtained. Ultrasound was performed to localize and mark an adequate pocket of fluid in the right chest. The area was then prepped and draped in the normal sterile fashion. 1% Lidocaine was used for local anesthesia. Under ultrasound guidance a 6 Fr Safe-T-Centesis catheter was introduced. Thoracentesis  was performed. The catheter was removed and a dressing applied. FINDINGS: A total of approximately 1.3 L of amber-colored fluid was removed. Samples were sent to the laboratory as requested by the clinical team. IMPRESSION: Successful ultrasound guided right thoracentesis yielding 1.3 L of pleural fluid. Read by: Soyla Dryer, NP Electronically Signed   By: Miachel Roux M.D.   On: 03/31/2020 14:01    Assessment and plan- Patient is a 75 y.o. male with baseline COPD admitted for acute hypoxic respiratory failure found to have multiple pleural masses hilar and mediastinal adenopathy Large right upper lobe lung mass and pleural effusion concerning for metastatic lung cancer  I have reviewed ct chest images independently and showed images to the patient and discussed findings with him. Cytology from pleural fluid is pending.   Overall findings on CT scan are concerning for metastatic lung cancer.  Patient would need outpatient PET CT scan for further work-up.  Recommend getting MRI brain with and without contrast while he is inpatient and I will put those orders in.  If pleural fluid cytology gives [place] the answers we need including NGS testing he may not need a repeat tissue biopsy.  Otherwise we may be looking at EBUS guided biopsy of  the mediastinal lymph nodes as an outpatient.  Discussed with the patient and overall findings are concerning for stage IV lung cancer.  Further treatment options will be decided based on biopsy.  Treatment would be palliative and not curative.  Polycythemia: Chronic secondary to COPD and smoking.  Continue to monitor     Visit Diagnosis 1. COPD exacerbation ([place])   2. Pleural effusion   3. Tobacco abuse   4. Positive D dimer   5. Metastatic malignant neoplasm, unspecified site ([place])   6. Pleural effusion on right   7. S/P thoracentesis     Dr. Randa Evens, MD, MPH [facility] at [facility] [phone] 03/31/2020 5:12 PM              '

## 2020-03-31 NOTE — Evaluation (Signed)
Physical Therapy Evaluation Patient Details Name: David Johnson MRN: [id number] DOB: 1945/04/28 Today's Date: 03/31/2020   History of Present Illness  David Johnson is a 75 y.o. male with medical history significant for GERD, suspected COPD, obstructive sleep apnea compliant with home nocturnal CPAP, polycythemia, who is admitted to [facility] on 03/30/2020 with suspected acute COPD exacerbation, after presenting from home to [facility] Emergency Department complaining of chest pain  Clinical Impression  Patient received in recliner, wife in room. Patient is agreeable to PT assessment. Just returned from thoracentesis. Patient reports sob with activity. No chest pain. He is mod independent with transfers and ambulation 175 feet with SPC. Patient is moderately sob with activity, but O2 saturations remained > 90%. He reports 4/10 on dyspnea scale after walking. patient will continue to benefit from ambulation while here to improve activity tolerance, however is safe to do so with family or nursing staff.          Follow Up Recommendations No PT follow up    Equipment Recommendations  None recommended by PT    Recommendations for Other Services       Precautions / Restrictions Precautions Precautions: Fall Precaution Comments: mod fall Restrictions Weight Bearing Restrictions: No      Mobility  Bed Mobility               General bed mobility comments: patient received in recliner, remained in recliner    Transfers Overall transfer level: Modified independent                  Ambulation/Gait Ambulation/Gait assistance: Supervision Gait Distance (Feet): 175 Feet Assistive device: Straight cane Gait Pattern/deviations: Step-through pattern Gait velocity: WNL   General Gait Details: patient ambulated without assistance using SPC. Some sob with activity, with brief standing rest break to get O2 saturations.  Stairs            Wheelchair  Mobility    Modified Rankin (Stroke Patients Only)       Balance Overall balance assessment: Modified Independent                                           Pertinent Vitals/Pain Pain Assessment: No/denies pain    Home Living Family/patient expects to be discharged to:: Private residence Living Arrangements: Spouse/significant other Available Help at Discharge: Family;Available 24 hours/day Type of Home: House Home Access: Stairs to enter     Home Layout: One level Home Equipment: Kasandra Knudsen - single point      Prior Function Level of Independence: Independent with assistive device(s)         Comments: patient ambulates with SPC, can drive, but doesnt much. Independent with ADLs     Hand Dominance        Extremity/Trunk Assessment   Upper Extremity Assessment Upper Extremity Assessment: Overall WFL for tasks assessed    Lower Extremity Assessment Lower Extremity Assessment: Overall WFL for tasks assessed    Cervical / Trunk Assessment Cervical / Trunk Assessment: Normal  Communication   Communication: No difficulties  Cognition Arousal/Alertness: Awake/alert Behavior During Therapy: WFL for tasks assessed/performed Overall Cognitive Status: Within Functional Limits for tasks assessed  General Comments      Exercises     Assessment/Plan    PT Assessment Patent does not need any further PT services  PT Problem List         PT Treatment Interventions      PT Goals (Current goals can be found in the Care Plan section)  Acute Rehab PT Goals Patient Stated Goal: to return home, breathe better PT Goal Formulation: With patient/family Time For Goal Achievement: 04/04/20    Frequency     Barriers to discharge        Co-evaluation               AM-PAC PT "6 Clicks" Mobility  Outcome Measure Help needed turning from your back to your side while in a flat bed without  using bedrails?: None Help needed moving from lying on your back to sitting on the side of a flat bed without using bedrails?: A Little Help needed moving to and from a bed to a chair (including a wheelchair)?: None Help needed standing up from a chair using your arms (e.g., wheelchair or bedside chair)?: None Help needed to walk in hospital room?: None Help needed climbing 3-5 steps with a railing? : None 6 Click Score: 23    End of Session   Activity Tolerance: Patient tolerated treatment well Patient left: in chair;with call bell/phone within reach Nurse Communication: Mobility status      Time: 0630-1601 PT Time Calculation (min) (ACUTE ONLY): 14 min   Charges:   PT Evaluation $PT Eval Moderate Complexity: 1 Mod          Katrenia Alkins, PT, GCS 03/31/20,2:39 PM

## 2020-03-31 NOTE — Procedures (Signed)
PROCEDURE SUMMARY:  Successful US guided right thoracentesis. Yielded 1.3 L of amber-colored fluid. Pt tolerated procedure well. No immediate complications.  Specimen sent for labs. CXR ordered; no post-procedure pneumothorax identified  EBL < 2 mL  Theresa Duty, NP 03/31/2020 2:02 PM

## 2020-03-31 NOTE — Progress Notes (Signed)
Update: I contacted the on-call IR physician, Dr. Laurence Ferrari, via secure chat in order to notify IR of request for non-emergent diagnostic/therapeutic right thoracentesis on this patient.     Babs Bertin, DO Hospitalist

## 2020-03-31 NOTE — Progress Notes (Signed)
PROGRESS NOTE    David Johnson  HYQ:[id number] DOB: 23-Feb-1946 DOA: 03/30/2020 PCP: Maryland Pink, MD    Assessment & Plan:   Principal Problem:   Acute exacerbation of chronic obstructive pulmonary disease (COPD) ([place]) Active Problems:   OSA (obstructive sleep apnea)   Atypical chest pain   Metastatic disease (HCC)   Pleural effusion, right   SOB (shortness of breath)   David Johnson is a 75 y.o. male with medical history significant for GERD, suspected COPD, obstructive sleep apnea compliant with home nocturnal CPAP, polycythemia, who is admitted to [facility] on 03/30/2020 with suspected acute COPD exacerbation, after presenting from home to [facility] Emergency Department complaining of chest pain.    #) Acute COPD exacerbation, ruled out --Pt likely has COPD, but not in exacerbation.   --started on IV solumedrol and azithromycin Plan: --d/c IV solumedrol and azithromycin --cont DuoNeb  #) Atypical chest pain with MSK origin --negative troponin  --Suspect that patient's presenting chest pain is likely multifactorial, with musculoskeletal as well as pleuritic contributions stemming from CTA evidence of a lytic lesion of the manubrium as well as suspected pleuritic contributions from evidence of multiple newly noted findings on CTA chest that include right middle lobe lung nodule, large irregular right upper lobe opacity, and mediastinal adenopathy, all concerning for metastatic disease.   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   Plan: --oncology consult today --MRI brain with and without contrast   #) Obstructive sleep apnea on home CPAP Patient reports good compliance with his home nocturnal CPAP. --cont home CPAP   DVT prophylaxis: SCD/Compression stockings Code Status: Full code  Family Communication: wife updated at bedside today Status is: inpatient Dispo:   The patient is from: home Anticipated d/c is to: home Anticipated d/c date is: tomorrow if MRI brain completed. Patient currently is not medically ready to d/c due to: pending MRI brain, per oncology   Subjective and Interval History:  Pt reported dyspnea on exertion which has been present for the past month and progressively worsening.  Said albuterol and DuoNeb didn't help.    Received thoracentesis today.  Oncology consulted.   Objective: Vitals:   03/31/20 1254 03/31/20 1302 03/31/20 1431 03/31/20 1546  BP: 134/81 (!) 147/66  (!) 161/70  Pulse: (!) 110 84  65  Resp:    16  Temp:    97.8 F (36.6 C)  TempSrc:      SpO2: 93% 94% 91% 95%  Weight:      Height:        Intake/Output Summary (Last 24 hours) at 03/31/2020 2016 Last data filed at 03/31/2020 1827 Gross per 24 hour  Intake 420 ml  Output 300 ml  Net 120 ml   Filed Weights   03/30/20 0944 03/31/20 0456  Weight: (!) 156.5 kg (!) 156.6 kg    Examination:   Constitutional: NAD, AAOx3 HEENT: conjunctivae and lids normal, EOMI CV: No cyanosis.   RESP: normal  respiratory effort, on RA Extremities: No effusions, edema in BLE SKIN: warm, dry Neuro: II - XII grossly intact.   Psych: Normal mood and affect.  Appropriate judgement and reason   Data Reviewed: I have personally reviewed following labs and imaging studies  CBC: Recent Labs  Lab 03/30/20 1035 03/31/20 0514  WBC 13.3* 17.4*   NEUTROABS 10.0* 15.2*  HGB 19.1* 18.8*  HCT 55.7* 55.9*  MCV 91.8 92.1  PLT 201 093   Basic Metabolic Panel: Recent Labs  Lab 03/30/20 1035 03/30/20 1128 03/31/20 0514  NA 139  --  137  K 4.4  --  4.2  CL 105  --  102  CO2 26  --  22  GLUCOSE 116*  --  166*  BUN 11  --  17  CREATININE 0.74  --  0.82  CALCIUM 9.2  --  9.2  MG  --  2.2 2.2  PHOS  --   --  3.1   GFR: Estimated Creatinine Clearance: 122.1 mL/min (by C-G formula based on SCr of 0.82 mg/dL). Liver Function Tests: Recent Labs  Lab 03/30/20 1035 03/31/20 0514  AST 36 31  ALT 55* 48*  ALKPHOS 66 68  BILITOT 0.9 0.7  PROT 7.2 7.0  ALBUMIN 3.9 3.7   No results for input(s): LIPASE, AMYLASE in the last 168 hours. No results for input(s): AMMONIA in the last 168 hours. Coagulation Profile: Recent Labs  Lab 03/31/20 0514  INR 1.0   Cardiac Enzymes: No results for input(s): CKTOTAL, CKMB, CKMBINDEX, TROPONINI in the last 168 hours. BNP (last 3 results) No results for input(s): PROBNP in the last 8760 hours. HbA1C: No results for input(s): HGBA1C in the last 72 hours. CBG: No results for input(s): GLUCAP in the last 168 hours. Lipid Profile: No results for input(s): CHOL, HDL, LDLCALC, TRIG, CHOLHDL, LDLDIRECT in the last 72 hours. Thyroid Function Tests: No results for input(s): TSH, T4TOTAL, FREET4, T3FREE, THYROIDAB in the last 72 hours. Anemia Panel: No results for input(s): VITAMINB12, FOLATE, FERRITIN, TIBC, IRON, RETICCTPCT in the last 72 hours. Sepsis Labs: Recent Labs  Lab 03/30/20 1035  PROCALCITON <0.10    Recent Results (from the past 240 hour(s))  SARS Coronavirus 2 by RT PCR (hospital order, performed in [facility] hospital lab) Nasopharyngeal Nasopharyngeal Swab     Status: None   Collection Time: 03/30/20 10:36 AM   Specimen: Nasopharyngeal Swab  Result Value Ref Range Status   SARS Coronavirus 2 NEGATIVE NEGATIVE Final    Comment: (NOTE) SARS-CoV-2 target nucleic acids are  NOT DETECTED.  The SARS-CoV-2 RNA is generally detectable in upper and lower respiratory specimens during the acute phase of infection. The lowest concentration of SARS-CoV-2 viral copies this assay can detect is 250 copies / mL. A negative result does not preclude SARS-CoV-2 infection and should not be used as the sole basis for treatment or other patient management decisions.  A negative result may occur with improper specimen collection / handling, submission of specimen other than nasopharyngeal swab, presence of viral mutation(s) within the areas targeted by this assay, and inadequate number of viral copies (<250 copies / mL). A negative result must be combined with clinical observations, patient history, and epidemiological information.  Fact Sheet for Patients:   StrictlyIdeas.no  Fact Sheet for Healthcare Providers: BankingDealers.co.za  This test is not yet approved or  cleared by the [place] FDA and has been authorized for detection and/or diagnosis of SARS-CoV-2 by FDA under an Emergency Use Authorization (  EUA).  This EUA will remain in effect (meaning this test can be used) for the duration of the COVID-19 declaration under Section 564(b)(1) of the Act, 21 U.S.C. section 360bbb-3(b)(1), unless the authorization is terminated or revoked sooner.  Performed at [facility], [address] [place], [place] [address]       Radiology Studies: DG Chest 2 View  Result Date: 03/30/2020 CLINICAL DATA:  Pain and stiffness EXAM: CHEST - 2 VIEW COMPARISON:  June 03, 2016 FINDINGS: Cardiomediastinal silhouette is enlarged in contour. There is a rounded nodular opacity projecting along the RIGHT superior hilar border. Possible thickening of the RIGHT paratracheal stripe. There is a moderate RIGHT pleural effusion. No significant LEFT pleural effusion. Homogeneous opacification of the RIGHT lower lung. Mild diffuse  interstitial prominence. Visualized abdomen is unremarkable. Mild degenerative changes of the thoracic spine. IMPRESSION: 1. Rounded nodular opacity projecting along the RIGHT superior hilar border with possible thickening of the RIGHT paratracheal stripe. Consider further evaluation with dedicated cross-sectional imaging. 2. Moderate RIGHT pleural effusion. 3. Mild pulmonary edema. Homogeneous opacification of the RIGHT lower lung, most likely atelectasis. Electronically Signed   By: Valentino Saxon MD   On: 03/30/2020 10:28   US Venous Img Lower Bilateral  Result Date: 03/30/2020 CLINICAL DATA:  Shortness of breath, elevated D-dimer level. EXAM: BILATERAL LOWER EXTREMITY VENOUS DOPPLER ULTRASOUND TECHNIQUE: Gray-scale sonography with graded compression, as well as color Doppler and duplex ultrasound were performed to evaluate the lower extremity deep venous systems from the level of the common femoral vein and including the common femoral, femoral, profunda femoral, popliteal and calf veins including the posterior tibial, peroneal and gastrocnemius veins when visible. The superficial great saphenous vein was also interrogated. Spectral Doppler was utilized to evaluate flow at rest and with distal augmentation maneuvers in the common femoral, femoral and popliteal veins. COMPARISON:  November 26, 2013. FINDINGS: RIGHT LOWER EXTREMITY Common Femoral Vein: No evidence of thrombus. Normal compressibility, respiratory phasicity and response to augmentation. Saphenofemoral Junction: No evidence of thrombus. Normal compressibility and flow on color Doppler imaging. Profunda Femoral Vein: No evidence of thrombus. Normal compressibility and flow on color Doppler imaging. Femoral Vein: No evidence of thrombus. Normal compressibility, respiratory phasicity and response to augmentation. Popliteal Vein: No evidence of thrombus. Normal compressibility, respiratory phasicity and response to augmentation. Calf Veins: No  evidence of thrombus. Normal compressibility and flow on color Doppler imaging. Superficial Great Saphenous Vein: No evidence of thrombus. Normal compressibility. Venous Reflux:  None. Other Findings:  None. LEFT LOWER EXTREMITY Common Femoral Vein: No evidence of thrombus. Normal compressibility, respiratory phasicity and response to augmentation. Saphenofemoral Junction: No evidence of thrombus. Normal compressibility and flow on color Doppler imaging. Profunda Femoral Vein: No evidence of thrombus. Normal compressibility and flow on color Doppler imaging. Femoral Vein: No evidence of thrombus. Normal compressibility, respiratory phasicity and response to augmentation. Popliteal Vein: No evidence of thrombus. Normal compressibility, respiratory phasicity and response to augmentation. Calf Veins: No evidence of thrombus. Normal compressibility and flow on color Doppler imaging. Superficial Great Saphenous Vein: No evidence of thrombus. Normal compressibility. Venous Reflux:  None. Other Findings:  None. IMPRESSION: No evidence of deep venous thrombosis in either lower extremity. Electronically Signed   By: Marijo Conception M.D.   On: 03/30/2020 15:40   DG Chest Port 1 View  Result Date: 03/31/2020 CLINICAL DATA:  Right pleural effusion, status post thoracentesis EXAM: PORTABLE CHEST 1 VIEW COMPARISON:  03/30/2020 FINDINGS: Interval decrease in volume of a moderate  right pleural effusion, now small, with associated atelectasis or consolidation. Redemonstrated mass of the suprahilar right lung. Cardiomegaly. IMPRESSION: 1. Interval decrease in volume of a moderate right pleural effusion, now small, with associated atelectasis or consolidation. No pneumothorax. 2. Redemonstrated mass of the suprahilar right lung. Electronically Signed   By: Eddie Candle M.D.   On: 03/31/2020 13:37   CT Angio Chest/Abd/Pel for Dissection W and/or Wo Contrast  Result Date: 03/30/2020 CLINICAL DATA:  Chest and back pain. EXAM: CT  ANGIOGRAPHY CHEST, ABDOMEN AND PELVIS TECHNIQUE: Non-contrast CT of the chest was initially obtained. Multidetector CT imaging through the chest, abdomen and pelvis was performed using the standard protocol during bolus administration of intravenous contrast. Multiplanar reconstructed images and MIPs were obtained and reviewed to evaluate the vascular anatomy. CONTRAST:  157mL OMNIPAQUE IOHEXOL 350 MG/ML SOLN COMPARISON:  None. FINDINGS: CTA CHEST FINDINGS Cardiovascular: Preferential opacification of the thoracic aorta. No evidence of thoracic aortic aneurysm or dissection. Normal heart size. No pericardial effusion. Mediastinum/Nodes: The esophagus is unremarkable. 6.4 x 3.7 cm right paratracheal adenopathy is noted concerning for malignancy or metastatic disease. 1.9 cm right hilar lymph node is noted. 12 mm anterior mediastinal lymph node is noted concerning for metastatic disease. 2.8 cm right thyroid nodule is noted. Lungs/Pleura: No pneumothorax is noted. Left lung is clear. Moderate size right pleural effusion is noted with associated atelectasis of the right lower lobe. Several pleural-based masses are noted posteriorly concerning for metastatic disease. Large irregular opacity is noted posteriorly in the right upper lobe which may represent malignancy or associated postobstructive atelectasis. 12 x 9 mm nodule is noted in right middle lobe concerning for metastatic disease. Musculoskeletal: Lytic lesion is noted in the manubrium consistent with metastatic disease. Review of the MIP images confirms the above findings. CTA ABDOMEN AND PELVIS FINDINGS VASCULAR Aorta: Normal caliber aorta without aneurysm, dissection, vasculitis or significant stenosis. Celiac: Patent without evidence of aneurysm, dissection, vasculitis or significant stenosis. SMA: Patent without evidence of aneurysm, dissection, vasculitis or significant stenosis. Renals: Both renal arteries are patent without evidence of aneurysm,  dissection, vasculitis, fibromuscular dysplasia or significant stenosis. IMA: Patent without evidence of aneurysm, dissection, vasculitis or significant stenosis. Inflow: Patent without evidence of aneurysm, dissection, vasculitis or significant stenosis. Veins: No obvious venous abnormality within the limitations of this arterial phase study. Review of the MIP images confirms the above findings. NON-VASCULAR Hepatobiliary: Cholelithiasis is noted. No biliary dilatation is noted. The liver is unremarkable. Pancreas: Unremarkable. No pancreatic ductal dilatation or surrounding inflammatory changes. Spleen: Normal in size without focal abnormality. Adrenals/Urinary Tract: Adrenal glands are unremarkable. Kidneys are normal, without renal calculi, focal lesion, or hydronephrosis. Bladder is unremarkable. Stomach/Bowel: Stomach is within normal limits. Appendix appears normal. No evidence of bowel wall thickening, distention, or inflammatory changes. Lymphatic: No significant adenopathy is noted in the abdomen or pelvis. Reproductive: Mild prostatic enlargement is noted. Other: No abdominal wall hernia or abnormality. No abdominopelvic ascites. Musculoskeletal: No acute or significant osseous findings. Review of the MIP images confirms the above findings. IMPRESSION: 1. There is no evidence of thoracic or abdominal aortic dissection or aneurysm. 2. Mediastinal adenopathy is noted concerning for malignancy or metastatic disease. 3. Moderate size right pleural effusion is noted with associated atelectasis of the right lower lobe. There appears to be several pleural based masses in the right upper lung zone concerning for metastatic disease. 4. Large irregular opacity is noted posteriorly in the right upper lobe which may represent malignancy or associated postobstructive atelectasis. 5.  12 x 9 mm nodule is noted in right middle lobe concerning for metastatic disease. 6. Lytic lesion is noted in the manubrium consistent  with metastatic disease. 7. Cholelithiasis. 8. Mild prostatic enlargement. 9. 2.8 cm right thyroid nodule is noted. In the setting of significant comorbidities or limited life expectancy, no follow-up recommended. (Ref: J Am Coll Radiol. 2015 Feb;12(2): 143-50). Electronically Signed   By: Marijo Conception M.D.   On: 03/30/2020 14:10   US THORACENTESIS ASP PLEURAL SPACE W/IMG GUIDE  Result Date: 03/31/2020 INDICATION: Patient with newly discovered pleural-based masses and right pleural effusion presents today for therapeutic and diagnostic thoracentesis. EXAM: ULTRASOUND GUIDED THORACENTESIS MEDICATIONS: 1% lidocaine 10 mL COMPLICATIONS: None immediate. PROCEDURE: An ultrasound guided thoracentesis was thoroughly discussed with the patient and questions answered. The benefits, risks, alternatives and complications were also discussed. The patient understands and wishes to proceed with the procedure. Written consent was obtained. Ultrasound was performed to localize and mark an adequate pocket of fluid in the right chest. The area was then prepped and draped in the normal sterile fashion. 1% Lidocaine was used for local anesthesia. Under ultrasound guidance a 6 Fr Safe-T-Centesis catheter was introduced. Thoracentesis was performed. The catheter was removed and a dressing applied. FINDINGS: A total of approximately 1.3 L of amber-colored fluid was removed. Samples were sent to the laboratory as requested by the clinical team. IMPRESSION: Successful ultrasound guided right thoracentesis yielding 1.3 L of pleural fluid. Read by: Soyla Dryer, NP Electronically Signed   By: Miachel Roux M.D.   On: 03/31/2020 14:01     Scheduled Meds: Continuous Infusions:   LOS: 1 day     Enzo Bi, MD [facility] If 7PM-7AM, please contact night-coverage 03/31/2020, 8:16 PM

## 2020-04-01 ENCOUNTER — Inpatient Hospital Stay: Payer: Managed Care, Other (non HMO)

## 2020-04-01 DIAGNOSIS — J441 Chronic obstructive pulmonary disease with (acute) exacerbation: Secondary | ICD-10-CM | POA: Diagnosis not present

## 2020-04-01 LAB — BASIC METABOLIC PANEL
Anion gap: 10 (ref 5–15)
BUN: 22 mg/dL (ref 8–23)
CO2: 24 mmol/L (ref 22–32)
Calcium: 9.3 mg/dL (ref 8.9–10.3)
Chloride: 105 mmol/L (ref 98–111)
Creatinine, Ser: 1.04 mg/dL (ref 0.61–1.24)
GFR, Estimated: >60 mL/min (ref >60)
Glucose, Bld: 125 mg/dL — ABNORMAL HIGH (ref 70–99)
Potassium: 4.7 mmol/L (ref 3.5–5.1)
Sodium: 139 mmol/L (ref 135–145)

## 2020-04-01 LAB — CBC
HCT: 55.6 % — ABNORMAL HIGH (ref 39.0–52.0)
Hemoglobin: 18.8 g/dL — ABNORMAL HIGH (ref 13.0–17.0)
MCH: 31.3 pg (ref 26.0–34.0)
MCHC: 33.8 g/dL (ref 30.0–36.0)
MCV: 92.5 fL (ref 80.0–100.0)
Platelets: 219 10*3/uL (ref 150–400)
RBC: 6.01 MIL/uL — ABNORMAL HIGH (ref 4.22–5.81)
RDW: 13.9 % (ref 11.5–15.5)
WBC: 20.4 10*3/uL — ABNORMAL HIGH (ref 4.0–10.5)
nRBC: 0 % (ref 0.0–0.2)

## 2020-04-01 LAB — MAGNESIUM: Magnesium: 2.3 mg/dL (ref 1.7–2.4)

## 2020-04-01 MED ORDER — GADOBUTROL 1 MMOL/ML IV SOLN
10.0000 mL | Freq: Once | INTRAVENOUS | Status: AC | PRN
  Administered 2020-04-01: 10 mL via INTRAVENOUS

## 2020-04-01 NOTE — Progress Notes (Signed)
Pt given d/c education. All questions answered, verbal understanding given.  IV removed. Pt has all belongings and awaiting his wife's return.

## 2020-04-01 NOTE — Discharge Summary (Addendum)
Physician Discharge Summary   David Johnson  male DOB: 08/05/1945  GUY:[id number]  PCP: Maryland Pink, MD  Admit date: 03/30/2020 Discharge date: 04/01/2020  Admitted From: home Disposition:  home CODE STATUS: Full code   Hospital Course:  For full details, please see H&P, progress notes, consult notes and ancillary notes.  Briefly,  David Brann Meadowsis a 75 y.o.malewith medical history significant forGERD, suspected COPD, obstructive sleep apnea compliant with home nocturnal CPAP, polycythemia,who was admitted to [facility] on 2/3/2022with suspected acute COPD exacerbation,after presenting from home to [facility] Emergency Department complaining ofchest pain.   #)Acute COPD exacerbation, ruled out Pt likely has COPD, but not in exacerbation.  No significant wheezing, no cough with sputum production, and no hypoxia.  Dyspnea worse on exertion and has been progressive over months.  Likely due to under-treated COPD and malignant lung pathology.  Pt was started on IV solumedrol and azithromycin on admission which were both d/c'ed.  Pt reported DuoNeb didn't help.  Pt advised to folllow up with outpatient provider for management of his chronic COPD.  #) Atypical chest pain with MSK origin --negative troponin. --Suspect that patient's presenting chest pain is likely multifactorial, with musculoskeletal as well as pleuritic contributions stemming from CTA evidence of a lytic lesion of the manubrium as well as suspected pleuritic contributions from evidence of multiple newly noted findings on CTA chest that include right middle lobe lung nodule, large irregular right upper lobe opacity, and mediastinal adenopathy, all concerning for metastatic disease. Pt received tylenol and IV toradol PRN for pain  #) Right pleural effusion CTA chest showed evidence of a moderate-sized right pleural effusion associated with atelectasis of the right lower lobe.  Pt received right  thoracentesis with 1.3L removed, with fluid studies send (including cytology).  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  MRI brain with and without contrast showed no mets in brain parenchyma.  Pt will follow up with oncology Dr. Janese Banks as outpatient.  #) Obstructive sleep apnea on home CPAP Patient reports good compliance with his home nocturnal CPAP. --cont home CPAP   Discharge Diagnoses:  Principal Problem:   Acute exacerbation of chronic obstructive pulmonary disease (COPD) ([place]) Active Problems:   OSA (obstructive sleep apnea)   Atypical chest pain   Metastatic disease (HCC)   Pleural effusion, right   SOB (shortness of breath)    Discharge Instructions:  Allergies as of 04/01/2020      Reactions   Cephalexin Rash      Medication List    TAKE these medications   albuterol 108 (90 Base) MCG/ACT inhaler Commonly known as: VENTOLIN HFA 2 puffs q.i.d. p.r.n. short of breath, wheezing, or cough        Follow-up Information    Maryland Pink, MD. Schedule an appointment as soon as possible for a visit in 1 week(s).   Specialty: Family Medicine Contact information: [address] [place] [address] [phone]        Sindy Guadeloupe, MD Follow up.   Specialty: Oncology Contact information: [address] [place] [address] [phone]  Allergies  Allergen Reactions  . Cephalexin Rash     The results of significant diagnostics from this  hospitalization (including imaging, microbiology, ancillary and laboratory) are listed below for reference.   Consultations:   Procedures/Studies: DG Chest 2 View  Result Date: 03/30/2020 CLINICAL DATA:  Pain and stiffness EXAM: CHEST - 2 VIEW COMPARISON:  June 03, 2016 FINDINGS: Cardiomediastinal silhouette is enlarged in contour. There is a rounded nodular opacity projecting along the RIGHT superior hilar border. Possible thickening of the RIGHT paratracheal stripe. There is a moderate RIGHT pleural effusion. No significant LEFT pleural effusion. Homogeneous opacification of the RIGHT lower lung. Mild diffuse interstitial prominence. Visualized abdomen is unremarkable. Mild degenerative changes of the thoracic spine. IMPRESSION: 1. Rounded nodular opacity projecting along the RIGHT superior hilar border with possible thickening of the RIGHT paratracheal stripe. Consider further evaluation with dedicated cross-sectional imaging. 2. Moderate RIGHT pleural effusion. 3. Mild pulmonary edema. Homogeneous opacification of the RIGHT lower lung, most likely atelectasis. Electronically Signed   By: Valentino Saxon MD   On: 03/30/2020 10:28   MR BRAIN W WO CONTRAST  Result Date: 04/01/2020 CLINICAL DATA:  Non-small cell lung cancer staging. EXAM: MRI HEAD WITHOUT AND WITH CONTRAST TECHNIQUE: Multiplanar, multiecho pulse sequences of the brain and surrounding structures were obtained without and with intravenous contrast. CONTRAST:  35mL GADAVIST GADOBUTROL 1 MMOL/ML IV SOLN COMPARISON:  None. FINDINGS: Brain: No acute infarction, hemorrhage, hydrocephalus, extra-axial collection or mass lesion. Diffuse cerebral atrophy with prominence of the extra-axial spaces along the frontal convexity. No abnormal enhancement. Vascular: Major arterial flow voids are maintained at the skull base. Skull and upper cervical spine: Normal marrow signal. Sinuses/Orbits: Sinuses are largely clear. Other: Nonspecific 1 cm mildly T2  hyperintense, T1 isointense lesion within the left temporal bone (series 12, image 65). IMPRESSION: 1. No evidence of acute abnormality or intraparenchymal metastatic disease. 2. Nonspecific 1 cm osseous lesion within the left temporal bone. This could represent a benign process such as fibrous dysplasia, but is nonspecific by MRI. If there is no diagnostic prior outside imaging, recommend CT of the temporal bone with contrast to further evaluate and exclude osseous metastatic disease. Electronically Signed   By: Margaretha Sheffield MD   On: 04/01/2020 10:04   US Venous Img Lower Bilateral  Result Date: 03/30/2020 CLINICAL DATA:  Shortness of breath, elevated D-dimer level. EXAM: BILATERAL LOWER EXTREMITY VENOUS DOPPLER ULTRASOUND TECHNIQUE: Gray-scale sonography with graded compression, as well as color Doppler and duplex ultrasound were performed to evaluate the lower extremity deep venous systems from the level of the common femoral vein and including the common femoral, femoral, profunda femoral, popliteal and calf veins including the posterior tibial, peroneal and gastrocnemius veins when visible. The superficial great saphenous vein was also interrogated. Spectral Doppler was utilized to evaluate flow at rest and with distal augmentation maneuvers in the common femoral, femoral and popliteal veins. COMPARISON:  November 26, 2013. FINDINGS: RIGHT LOWER EXTREMITY Common Femoral Vein: No evidence of thrombus. Normal compressibility, respiratory phasicity and response to augmentation. Saphenofemoral Junction: No evidence of thrombus. Normal compressibility and flow on color Doppler imaging. Profunda Femoral Vein: No evidence of thrombus. Normal compressibility and flow on color Doppler imaging. Femoral Vein: No evidence of thrombus. Normal compressibility, respiratory phasicity and response to augmentation. Popliteal Vein: No evidence of thrombus. Normal compressibility, respiratory phasicity and response to  augmentation. Calf Veins: No evidence of thrombus. Normal compressibility and flow on color Doppler imaging. Superficial Great Saphenous Vein: No evidence of  thrombus. Normal compressibility. Venous Reflux:  None. Other Findings:  None. LEFT LOWER EXTREMITY Common Femoral Vein: No evidence of thrombus. Normal compressibility, respiratory phasicity and response to augmentation. Saphenofemoral Junction: No evidence of thrombus. Normal compressibility and flow on color Doppler imaging. Profunda Femoral Vein: No evidence of thrombus. Normal compressibility and flow on color Doppler imaging. Femoral Vein: No evidence of thrombus. Normal compressibility, respiratory phasicity and response to augmentation. Popliteal Vein: No evidence of thrombus. Normal compressibility, respiratory phasicity and response to augmentation. Calf Veins: No evidence of thrombus. Normal compressibility and flow on color Doppler imaging. Superficial Great Saphenous Vein: No evidence of thrombus. Normal compressibility. Venous Reflux:  None. Other Findings:  None. IMPRESSION: No evidence of deep venous thrombosis in either lower extremity. Electronically Signed   By: Marijo Conception M.D.   On: 03/30/2020 15:40   DG Chest Port 1 View  Result Date: 03/31/2020 CLINICAL DATA:  Right pleural effusion, status post thoracentesis EXAM: PORTABLE CHEST 1 VIEW COMPARISON:  03/30/2020 FINDINGS: Interval decrease in volume of a moderate right pleural effusion, now small, with associated atelectasis or consolidation. Redemonstrated mass of the suprahilar right lung. Cardiomegaly. IMPRESSION: 1. Interval decrease in volume of a moderate right pleural effusion, now small, with associated atelectasis or consolidation. No pneumothorax. 2. Redemonstrated mass of the suprahilar right lung. Electronically Signed   By: Eddie Candle M.D.   On: 03/31/2020 13:37   CT Angio Chest/Abd/Pel for Dissection W and/or Wo Contrast  Result Date: 03/30/2020 CLINICAL DATA:   Chest and back pain. EXAM: CT ANGIOGRAPHY CHEST, ABDOMEN AND PELVIS TECHNIQUE: Non-contrast CT of the chest was initially obtained. Multidetector CT imaging through the chest, abdomen and pelvis was performed using the standard protocol during bolus administration of intravenous contrast. Multiplanar reconstructed images and MIPs were obtained and reviewed to evaluate the vascular anatomy. CONTRAST:  166mL OMNIPAQUE IOHEXOL 350 MG/ML SOLN COMPARISON:  None. FINDINGS: CTA CHEST FINDINGS Cardiovascular: Preferential opacification of the thoracic aorta. No evidence of thoracic aortic aneurysm or dissection. Normal heart size. No pericardial effusion. Mediastinum/Nodes: The esophagus is unremarkable. 6.4 x 3.7 cm right paratracheal adenopathy is noted concerning for malignancy or metastatic disease. 1.9 cm right hilar lymph node is noted. 12 mm anterior mediastinal lymph node is noted concerning for metastatic disease. 2.8 cm right thyroid nodule is noted. Lungs/Pleura: No pneumothorax is noted. Left lung is clear. Moderate size right pleural effusion is noted with associated atelectasis of the right lower lobe. Several pleural-based masses are noted posteriorly concerning for metastatic disease. Large irregular opacity is noted posteriorly in the right upper lobe which may represent malignancy or associated postobstructive atelectasis. 12 x 9 mm nodule is noted in right middle lobe concerning for metastatic disease. Musculoskeletal: Lytic lesion is noted in the manubrium consistent with metastatic disease. Review of the MIP images confirms the above findings. CTA ABDOMEN AND PELVIS FINDINGS VASCULAR Aorta: Normal caliber aorta without aneurysm, dissection, vasculitis or significant stenosis. Celiac: Patent without evidence of aneurysm, dissection, vasculitis or significant stenosis. SMA: Patent without evidence of aneurysm, dissection, vasculitis or significant stenosis. Renals: Both renal arteries are patent without  evidence of aneurysm, dissection, vasculitis, fibromuscular dysplasia or significant stenosis. IMA: Patent without evidence of aneurysm, dissection, vasculitis or significant stenosis. Inflow: Patent without evidence of aneurysm, dissection, vasculitis or significant stenosis. Veins: No obvious venous abnormality within the limitations of this arterial phase study. Review of the MIP images confirms the above findings. NON-VASCULAR Hepatobiliary: Cholelithiasis is noted. No biliary dilatation is noted. The liver  is unremarkable. Pancreas: Unremarkable. No pancreatic ductal dilatation or surrounding inflammatory changes. Spleen: Normal in size without focal abnormality. Adrenals/Urinary Tract: Adrenal glands are unremarkable. Kidneys are normal, without renal calculi, focal lesion, or hydronephrosis. Bladder is unremarkable. Stomach/Bowel: Stomach is within normal limits. Appendix appears normal. No evidence of bowel wall thickening, distention, or inflammatory changes. Lymphatic: No significant adenopathy is noted in the abdomen or pelvis. Reproductive: Mild prostatic enlargement is noted. Other: No abdominal wall hernia or abnormality. No abdominopelvic ascites. Musculoskeletal: No acute or significant osseous findings. Review of the MIP images confirms the above findings. IMPRESSION: 1. There is no evidence of thoracic or abdominal aortic dissection or aneurysm. 2. Mediastinal adenopathy is noted concerning for malignancy or metastatic disease. 3. Moderate size right pleural effusion is noted with associated atelectasis of the right lower lobe. There appears to be several pleural based masses in the right upper lung zone concerning for metastatic disease. 4. Large irregular opacity is noted posteriorly in the right upper lobe which may represent malignancy or associated postobstructive atelectasis. 5. 12 x 9 mm nodule is noted in right middle lobe concerning for metastatic disease. 6. Lytic lesion is noted in the  manubrium consistent with metastatic disease. 7. Cholelithiasis. 8. Mild prostatic enlargement. 9. 2.8 cm right thyroid nodule is noted. In the setting of significant comorbidities or limited life expectancy, no follow-up recommended. (Ref: J Am Coll Radiol. 2015 Feb;12(2): 143-50). Electronically Signed   By: Marijo Conception M.D.   On: 03/30/2020 14:10   US THORACENTESIS ASP PLEURAL SPACE W/IMG GUIDE  Result Date: 03/31/2020 INDICATION: Patient with newly discovered pleural-based masses and right pleural effusion presents today for therapeutic and diagnostic thoracentesis. EXAM: ULTRASOUND GUIDED THORACENTESIS MEDICATIONS: 1% lidocaine 10 mL COMPLICATIONS: None immediate. PROCEDURE: An ultrasound guided thoracentesis was thoroughly discussed with the patient and questions answered. The benefits, risks, alternatives and complications were also discussed. The patient understands and wishes to proceed with the procedure. Written consent was obtained. Ultrasound was performed to localize and mark an adequate pocket of fluid in the right chest. The area was then prepped and draped in the normal sterile fashion. 1% Lidocaine was used for local anesthesia. Under ultrasound guidance a 6 Fr Safe-T-Centesis catheter was introduced. Thoracentesis was performed. The catheter was removed and a dressing applied. FINDINGS: A total of approximately 1.3 L of amber-colored fluid was removed. Samples were sent to the laboratory as requested by the clinical team. IMPRESSION: Successful ultrasound guided right thoracentesis yielding 1.3 L of pleural fluid. Read by: Soyla Dryer, NP Electronically Signed   By: Miachel Roux M.D.   On: 03/31/2020 14:01      Labs: BNP (last 3 results) Recent Labs    03/30/20 1036  BNP 93.8   Basic Metabolic Panel: Recent Labs  Lab 03/30/20 1035 03/30/20 1128 03/31/20 0514 04/01/20 0424  NA 139  --  137 139  K 4.4  --  4.2 4.7  CL 105  --  102 105  CO2 26  --  22 24  GLUCOSE  116*  --  166* 125*  BUN 11  --  17 22  CREATININE 0.74  --  0.82 1.04  CALCIUM 9.2  --  9.2 9.3  MG  --  2.2 2.2 2.3  PHOS  --   --  3.1  --    Liver Function Tests: Recent Labs  Lab 03/30/20 1035 03/31/20 0514  AST 36 31  ALT 55* 48*  ALKPHOS 66 68  BILITOT 0.9 0.7  PROT 7.2 7.0  ALBUMIN 3.9 3.7   No results for input(s): LIPASE, AMYLASE in the last 168 hours. No results for input(s): AMMONIA in the last 168 hours. CBC: Recent Labs  Lab 03/30/20 1035 03/31/20 0514 04/01/20 0424  WBC 13.3* 17.4* 20.4*  NEUTROABS 10.0* 15.2*  --   HGB 19.1* 18.8* 18.8*  HCT 55.7* 55.9* 55.6*  MCV 91.8 92.1 92.5  PLT 201 212 219   Cardiac Enzymes: No results for input(s): CKTOTAL, CKMB, CKMBINDEX, TROPONINI in the last 168 hours. BNP: Invalid input(s): POCBNP CBG: No results for input(s): GLUCAP in the last 168 hours. D-Dimer No results for input(s): DDIMER in the last 72 hours. Hgb A1c No results for input(s): HGBA1C in the last 72 hours. Lipid Profile No results for input(s): CHOL, HDL, LDLCALC, TRIG, CHOLHDL, LDLDIRECT in the last 72 hours. Thyroid function studies No results for input(s): TSH, T4TOTAL, T3FREE, THYROIDAB in the last 72 hours.  Invalid input(s): FREET3 Anemia work up No results for input(s): VITAMINB12, FOLATE, FERRITIN, TIBC, IRON, RETICCTPCT in the last 72 hours. Urinalysis No results found for: COLORURINE, APPEARANCEUR, [place], [place], [place], [place], [place], [place], PROTEINUR, UROBILINOGEN, NITRITE, LEUKOCYTESUR Sepsis Labs Invalid input(s): PROCALCITONIN,  WBC,  LACTICIDVEN Microbiology Recent Results (from the past 240 hour(s))  SARS Coronavirus 2 by RT PCR (hospital order, performed in [facility] hospital lab) Nasopharyngeal Nasopharyngeal Swab     Status: None   Collection Time: 03/30/20 10:36 AM   Specimen: Nasopharyngeal Swab  Result Value Ref Range Status   SARS Coronavirus 2 NEGATIVE NEGATIVE Final    Comment: (NOTE) SARS-CoV-2  target nucleic acids are NOT DETECTED.  The SARS-CoV-2 RNA is generally detectable in upper and lower respiratory specimens during the acute phase of infection. The lowest concentration of SARS-CoV-2 viral copies this assay can detect is 250 copies / mL. A negative result does not preclude SARS-CoV-2 infection and should not be used as the sole basis for treatment or other patient management decisions.  A negative result may occur with improper specimen collection / handling, submission of specimen other than nasopharyngeal swab, presence of viral mutation(s) within the areas targeted by this assay, and inadequate number of viral copies (<250 copies / mL). A negative result must be combined with clinical observations, patient history, and epidemiological information.  Fact Sheet for Patients:   StrictlyIdeas.no  Fact Sheet for Healthcare Providers: BankingDealers.co.za  This test is not yet approved or  cleared by the [place] FDA and has been authorized for detection and/or diagnosis of SARS-CoV-2 by FDA under an Emergency Use Authorization (EUA).  This EUA will remain in effect (meaning this test can be used) for the duration of the COVID-19 declaration under Section 564(b)(1) of the Act, 21 U.S.C. section 360bbb-3(b)(1), unless the authorization is terminated or revoked sooner.  Performed at [facility], [address] [place], [place] [address]   Body fluid culture     Status: None (Preliminary result)   Collection Time: 03/31/20  1:10 PM   Specimen: PATH Cytology Pleural fluid  Result Value Ref Range Status   Specimen Description   Final    PLEURAL Performed at [facility], [address] [place], [place] [address]    Special Requests   Final    NONE Performed at [facility], [address] [place], [place] [address]    Gram Stain   Final    FEW WBC PRESENT, PREDOMINANTLY  MONONUCLEAR NO ORGANISMS SEEN Performed at [facility], [address] [place], [place] [address]    Culture PENDING  Incomplete   Report Status PENDING  Incomplete     Total time spend on discharging this patient, including the last patient exam, discussing the hospital stay, instructions for ongoing care as it relates to all pertinent caregivers, as well as preparing the medical discharge records, prescriptions, and/or referrals as applicable, is 40 minutes.    Enzo Bi, MD  [facility] 04/01/2020, 11:30 AM

## 2020-04-02 LAB — TRIGLYCERIDES, BODY FLUIDS: Triglycerides, Fluid: 24 mg/dL

## 2020-04-03 ENCOUNTER — Encounter: Payer: Self-pay | Admitting: *Deleted

## 2020-04-03 DIAGNOSIS — R918 Other nonspecific abnormal finding of lung field: Secondary | ICD-10-CM

## 2020-04-04 LAB — BODY FLUID CULTURE: Culture: NO GROWTH

## 2020-04-04 NOTE — Progress Notes (Signed)
  Oncology Nurse Navigator Documentation  Navigator Location: [facility] (04/04/20 1000)   )Navigator Encounter Type: Appt/Treatment Plan Review;Telephone (04/04/20 1000) Telephone: Lahoma Crocker Call;Appt Confirmation/Clarification (04/04/20 1000) Abnormal Finding Date: 03/30/20 (04/04/20 1000)                   Treatment Phase: Abnormal Scans (04/04/20 1000) Barriers/Navigation Needs: Coordination of Care (04/04/20 1000)   Interventions: Coordination of Care (04/04/20 1000)   Coordination of Care: Appts;Radiology (04/04/20 1000)        Acuity: Level 2-Minimal Needs (1-2 Barriers Identified) (04/04/20 1000)    phone call made to patient to introduce to navigator services and review upcoming appts. Pt made aware of appt for PET scan on 2/15. Informed that follow up with Dr. Janese Banks will be scheduled once results from cytology have been reported on pleural fluid. Contact info given and instructed to call with any questions or needs. Pt verbalized understanding. Nothing further needed at this time.     Time Spent with Patient: 30 (04/04/20 1000)

## 2020-04-05 ENCOUNTER — Telehealth: Payer: Self-pay | Admitting: Oncology

## 2020-04-05 ENCOUNTER — Other Ambulatory Visit: Payer: Self-pay | Admitting: Anatomic Pathology & Clinical Pathology

## 2020-04-05 NOTE — Telephone Encounter (Signed)
Per MD called pt to schedule appt. Spoke with pt's wife who confirmed 04/07/20 at 2:45pm.

## 2020-04-06 LAB — CYTOLOGY - NON PAP

## 2020-04-07 ENCOUNTER — Other Ambulatory Visit: Payer: Self-pay | Admitting: *Deleted

## 2020-04-07 ENCOUNTER — Inpatient Hospital Stay: Payer: Managed Care, Other (non HMO) | Attending: Oncology | Admitting: Oncology

## 2020-04-07 ENCOUNTER — Encounter: Payer: Self-pay | Admitting: Oncology

## 2020-04-07 VITALS — BP 124/67 | HR 76 | Resp 16 | Wt 336.8 lb

## 2020-04-07 DIAGNOSIS — Z20822 Contact with and (suspected) exposure to covid-19: Secondary | ICD-10-CM | POA: Insufficient documentation

## 2020-04-07 DIAGNOSIS — J910 Malignant pleural effusion: Secondary | ICD-10-CM | POA: Insufficient documentation

## 2020-04-07 DIAGNOSIS — J449 Chronic obstructive pulmonary disease, unspecified: Secondary | ICD-10-CM | POA: Diagnosis not present

## 2020-04-07 DIAGNOSIS — Z7189 Other specified counseling: Secondary | ICD-10-CM

## 2020-04-07 DIAGNOSIS — D369 Benign neoplasm, unspecified site: Secondary | ICD-10-CM | POA: Insufficient documentation

## 2020-04-07 DIAGNOSIS — C782 Secondary malignant neoplasm of pleura: Secondary | ICD-10-CM | POA: Diagnosis not present

## 2020-04-07 DIAGNOSIS — F17210 Nicotine dependence, cigarettes, uncomplicated: Secondary | ICD-10-CM | POA: Insufficient documentation

## 2020-04-07 DIAGNOSIS — K2270 Barrett's esophagus without dysplasia: Secondary | ICD-10-CM | POA: Insufficient documentation

## 2020-04-07 DIAGNOSIS — C3411 Malignant neoplasm of upper lobe, right bronchus or lung: Secondary | ICD-10-CM | POA: Diagnosis not present

## 2020-04-07 DIAGNOSIS — C7951 Secondary malignant neoplasm of bone: Secondary | ICD-10-CM | POA: Diagnosis not present

## 2020-04-07 DIAGNOSIS — Z7289 Other problems related to lifestyle: Secondary | ICD-10-CM | POA: Insufficient documentation

## 2020-04-07 DIAGNOSIS — Z8701 Personal history of pneumonia (recurrent): Secondary | ICD-10-CM | POA: Insufficient documentation

## 2020-04-07 DIAGNOSIS — Z8619 Personal history of other infectious and parasitic diseases: Secondary | ICD-10-CM | POA: Insufficient documentation

## 2020-04-07 DIAGNOSIS — Z5112 Encounter for antineoplastic immunotherapy: Secondary | ICD-10-CM | POA: Insufficient documentation

## 2020-04-07 DIAGNOSIS — Z01812 Encounter for preprocedural laboratory examination: Secondary | ICD-10-CM | POA: Insufficient documentation

## 2020-04-07 DIAGNOSIS — G4733 Obstructive sleep apnea (adult) (pediatric): Secondary | ICD-10-CM | POA: Insufficient documentation

## 2020-04-07 DIAGNOSIS — Z5111 Encounter for antineoplastic chemotherapy: Secondary | ICD-10-CM | POA: Insufficient documentation

## 2020-04-07 DIAGNOSIS — J948 Other specified pleural conditions: Secondary | ICD-10-CM

## 2020-04-07 DIAGNOSIS — S0590XA Unspecified injury of unspecified eye and orbit, initial encounter: Secondary | ICD-10-CM | POA: Insufficient documentation

## 2020-04-07 MED ORDER — LORAZEPAM 0.5 MG PO TABS: ORAL_TABLET | ORAL | 0 refills | Status: DC

## 2020-04-08 DIAGNOSIS — Z7189 Other specified counseling: Secondary | ICD-10-CM | POA: Insufficient documentation

## 2020-04-08 DIAGNOSIS — C3411 Malignant neoplasm of upper lobe, right bronchus or lung: Secondary | ICD-10-CM | POA: Insufficient documentation

## 2020-04-08 NOTE — Progress Notes (Signed)
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  Past medical history significant for COPD, GERD and obstructive sleep apnea.  CT angio chest abdomen pelvis showed 6.4 x 3.7 cm right paratracheal adenopathy concerning for malignancy.  1.9 cm right hilar node.  12 mm anterior mediastinal lymph node.  2.8 cm right thyroid nodule.  Several pleural-based masses concerning for metastatic disease.  Large irregular opacity in the right upper lobe which may be malignancy versus postobstructive atelectasis.  Lytic lesion noted in the manubrium consistent with metastatic disease.  Moderate right-sided pleural effusion s/p thoracentesis.    Cytology showedPoorly differentiated carcinoma with neuroendocrine differentiation.  Tumor cells were positive for pancytokeratin and CD56 but negative for TTF-1 p40 and synaptophysin.  Chromogranin stain was also negative.  MRI brain was negative for metastatic disease.  Interval history-patient is doing well post hospitalization.  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  He is here with his daughter today  ECOG PS- 1 Pain scale- 0 Opioid associated constipation- no  Review of systems- Review of Systems  Constitutional: Positive for malaise/fatigue. Negative for chills, fever and weight loss.   HENT: Negative for congestion, ear discharge and nosebleeds.   Eyes: Negative for blurred vision.  Respiratory: Negative for cough, hemoptysis, sputum production, shortness of breath and wheezing.   Cardiovascular: Negative for chest pain, palpitations, orthopnea and claudication.  Gastrointestinal: Negative for abdominal pain, blood in stool, constipation, diarrhea, heartburn, melena, nausea and vomiting.  Genitourinary: Negative for dysuria, flank pain, frequency, hematuria and urgency.  Musculoskeletal: Negative for back pain, joint pain and myalgias.  Skin: Negative for rash.  Neurological: Negative for dizziness, tingling, focal weakness, seizures, weakness and headaches.  Endo/Heme/Allergies: Does not bruise/bleed easily.  Psychiatric/Behavioral: Negative for depression and suicidal ideas. The patient does not have insomnia.       Allergies  Allergen Reactions  . Cephalexin Rash     Past Medical History:  Diagnosis Date  . Adenomatous polyps   . Barrett's esophagus   . Colon polyp   . COPD (chronic obstructive pulmonary disease) ([place])    uses inhaler, "i may have COPD"  . Dyspnea   . Eye injury   . GERD (gastroesophageal reflux disease)   . History of gonorrhea   . History of pneumonia   . Obesity   . Pneumonia   . Reflux esophagitis   . Sleep apnea      Past Surgical History:  Procedure Laterality Date  . CATARACT EXTRACTION Left   . COLONOSCOPY    . COLONOSCOPY WITH PROPOFOL N/A 05/19/2015   Procedure: COLONOSCOPY WITH PROPOFOL;  Surgeon: [name], MD;  Location: [facility] ENDOSCOPY;  Service: Endoscopy;  Laterality: N/A;  . COLONOSCOPY WITH PROPOFOL N/A 09/11/2018   Procedure: COLONOSCOPY WITH PROPOFOL;  Surgeon: [name], MD;  Location: [facility] ENDOSCOPY;  Service: Endoscopy;  Laterality: N/A;  . ESOPHAGOGASTRODUODENOSCOPY (  EGD) WITH PROPOFOL N/A 05/19/2015   Procedure: ESOPHAGOGASTRODUODENOSCOPY (EGD) WITH PROPOFOL;  Surgeon: [name], MD;   Location[facility] ENDOSCOPY;  Service: Endoscopy;  Laterality: N/A;  . ESOPHAGOGASTRODUODENOSCOPY (EGD) WITH PROPOFOL N/A 10/11/2016   Procedure: ESOPHAGOGASTRODUODENOSCOPY (EGD) WITH PROPOFOL;  Surgeon: [name], MD;  Location: [facility] ENDOSCOPY;  Service: Endoscopy;  Laterality: N/A;  . ESOPHAGOGASTRODUODENOSCOPY (EGD) WITH PROPOFOL N/A 01/01/2018   Procedure: ESOPHAGOGASTRODUODENOSCOPY (EGD) WITH PROPOFOL;  Surgeon: [name], MD;  Location: [facility] ENDOSCOPY;  Service: Endoscopy;  Laterality: N/A;  . ESOPHAGOGASTRODUODENOSCOPY (EGD) WITH PROPOFOL N/A 09/11/2018   Procedure: ESOPHAGOGASTRODUODENOSCOPY (EGD) WITH PROPOFOL;  Surgeon: [name], MD;  Location: [facility] ENDOSCOPY;  Service: Endoscopy;  Laterality: N/A;  . EYE SURGERY      Social History   Socioeconomic History  . Marital status: Married    Spouse name: Not on file  . Number of children: Not on file  . Years of education: Not on file  . Highest education level: Not on file  Occupational History  . Not on file  Tobacco Use  . Smoking status: Current Every Day Smoker    Packs/day: 1.50    Years: 35.00    Pack years: 52.50    Types: Cigarettes, Cigars    Last attempt to quit: 03/04/2015    Years since quitting: 5.1  . Smokeless tobacco: Never Used  Vaping Use  . Vaping Use: Every day  . Substances: Flavoring  Substance and Sexual Activity  . Alcohol use: Yes    Alcohol/week: 8.0 standard drinks    Types: 8 Cans of beer per week    Comment: Occassionally on weekends   . Drug use: No  . Sexual activity: Not on file  Other Topics Concern  . Not on file  Social History Narrative  . Not on file   Social Determinants of Health   Financial Resource Strain: Not on file  Food Insecurity: Not on file  Transportation Needs: Not on file  Physical Activity: Not on file  Stress: Not on file  Social Connections: Not on file  Intimate Partner Violence: Not on file    Family History  Problem Relation Age  of Onset  . Emphysema Mother      Current Outpatient Medications:  .  albuterol (PROVENTIL HFA;VENTOLIN HFA) 108 (90 Base) MCG/ACT inhaler, 2 puffs q.i.d. p.r.n. short of breath, wheezing, or cough, Disp: , Rfl:  .  neomycin-polymyxin-dexameth (MAXITROL) 0.1 % OINT, Administer 1 application into the left eye 4 (four) times a day as needed., Disp: , Rfl:  .  LORazepam (ATIVAN) 0.5 MG tablet, Take 1 tablet by mouth prior to PET scan., Disp: 2 tablet, Rfl: 0  Physical exam:  Vitals:   04/07/20 1553  BP: 124/67  Pulse: 76  Resp: 16  SpO2: 95%  Weight: (!) 336 lb 12.8 oz (152.8 kg)   Physical Exam Constitutional:      General: He is not in acute distress. Eyes:     Extraocular Movements: EOM normal.     Pupils: Pupils are equal, round, and reactive to light.  Pulmonary:     Effort: Pulmonary effort is normal.  Skin:    General: Skin is warm and dry.  Neurological:     Mental Status: He is alert and oriented to person, place, and time.      CMP Latest Ref Rng & Units 04/01/2020  Glucose 70 - 99 mg/dL 125(H)  BUN 8 - 23 mg/dL 22  Creatinine 0.61 - 1.24  mg/dL 1.04  Sodium 135 - 145 mmol/L 139  Potassium 3.5 - 5.1 mmol/L 4.7  Chloride 98 - 111 mmol/L 105  CO2 22 - 32 mmol/L 24  Calcium 8.9 - 10.3 mg/dL 9.3  Total Protein 6.5 - 8.1 g/dL -  Total Bilirubin 0.3 - 1.2 mg/dL -  Alkaline Phos 38 - 126 U/L -  AST 15 - 41 U/L -  ALT 0 - 44 U/L -   CBC Latest Ref Rng & Units 04/01/2020  WBC 4.0 - 10.5 K/uL 20.4(H)  Hemoglobin 13.0 - 17.0 g/dL 18.8(H)  Hematocrit 39.0 - 52.0 % 55.6(H)  Platelets 150 - 400 K/uL 219    No images are attached to the encounter.  DG Chest 2 View  Result Date: 03/30/2020 CLINICAL DATA:  Pain and stiffness EXAM: CHEST - 2 VIEW COMPARISON:  June 03, 2016 FINDINGS: Cardiomediastinal silhouette is enlarged in contour. There is a rounded nodular opacity projecting along the RIGHT superior hilar border. Possible thickening of the RIGHT paratracheal stripe.  There is a moderate RIGHT pleural effusion. No significant LEFT pleural effusion. Homogeneous opacification of the RIGHT lower lung. Mild diffuse interstitial prominence. Visualized abdomen is unremarkable. Mild degenerative changes of the thoracic spine. IMPRESSION: 1. Rounded nodular opacity projecting along the RIGHT superior hilar border with possible thickening of the RIGHT paratracheal stripe. Consider further evaluation with dedicated cross-sectional imaging. 2. Moderate RIGHT pleural effusion. 3. Mild pulmonary edema. Homogeneous opacification of the RIGHT lower lung, most likely atelectasis. Electronically Signed   By: Valentino Saxon MD   On: 03/30/2020 10:28   MR BRAIN W WO CONTRAST  Result Date: 04/01/2020 CLINICAL DATA:  Non-small cell lung cancer staging. EXAM: MRI HEAD WITHOUT AND WITH CONTRAST TECHNIQUE: Multiplanar, multiecho pulse sequences of the brain and surrounding structures were obtained without and with intravenous contrast. CONTRAST:  70mL GADAVIST GADOBUTROL 1 MMOL/ML IV SOLN COMPARISON:  None. FINDINGS: Brain: No acute infarction, hemorrhage, hydrocephalus, extra-axial collection or mass lesion. Diffuse cerebral atrophy with prominence of the extra-axial spaces along the frontal convexity. No abnormal enhancement. Vascular: Major arterial flow voids are maintained at the skull base. Skull and upper cervical spine: Normal marrow signal. Sinuses/Orbits: Sinuses are largely clear. Other: Nonspecific 1 cm mildly T2 hyperintense, T1 isointense lesion within the left temporal bone (series 12, image 65). IMPRESSION: 1. No evidence of acute abnormality or intraparenchymal metastatic disease. 2. Nonspecific 1 cm osseous lesion within the left temporal bone. This could represent a benign process such as fibrous dysplasia, but is nonspecific by MRI. If there is no diagnostic prior outside imaging, recommend CT of the temporal bone with contrast to further evaluate and exclude osseous  metastatic disease. Electronically Signed   By: Margaretha Sheffield MD   On: 04/01/2020 10:04   US Venous Img Lower Bilateral  Result Date: 03/30/2020 CLINICAL DATA:  Shortness of breath, elevated D-dimer level. EXAM: BILATERAL LOWER EXTREMITY VENOUS DOPPLER ULTRASOUND TECHNIQUE: Gray-scale sonography with graded compression, as well as color Doppler and duplex ultrasound were performed to evaluate the lower extremity deep venous systems from the level of the common femoral vein and including the common femoral, femoral, profunda femoral, popliteal and calf veins including the posterior tibial, peroneal and gastrocnemius veins when visible. The superficial great saphenous vein was also interrogated. Spectral Doppler was utilized to evaluate flow at rest and with distal augmentation maneuvers in the common femoral, femoral and popliteal veins. COMPARISON:  November 26, 2013. FINDINGS: RIGHT LOWER EXTREMITY Common Femoral Vein: No evidence of thrombus. Normal  compressibility, respiratory phasicity and response to augmentation. Saphenofemoral Junction: No evidence of thrombus. Normal compressibility and flow on color Doppler imaging. Profunda Femoral Vein: No evidence of thrombus. Normal compressibility and flow on color Doppler imaging. Femoral Vein: No evidence of thrombus. Normal compressibility, respiratory phasicity and response to augmentation. Popliteal Vein: No evidence of thrombus. Normal compressibility, respiratory phasicity and response to augmentation. Calf Veins: No evidence of thrombus. Normal compressibility and flow on color Doppler imaging. Superficial Great Saphenous Vein: No evidence of thrombus. Normal compressibility. Venous Reflux:  None. Other Findings:  None. LEFT LOWER EXTREMITY Common Femoral Vein: No evidence of thrombus. Normal compressibility, respiratory phasicity and response to augmentation. Saphenofemoral Junction: No evidence of thrombus. Normal compressibility and flow on color  Doppler imaging. Profunda Femoral Vein: No evidence of thrombus. Normal compressibility and flow on color Doppler imaging. Femoral Vein: No evidence of thrombus. Normal compressibility, respiratory phasicity and response to augmentation. Popliteal Vein: No evidence of thrombus. Normal compressibility, respiratory phasicity and response to augmentation. Calf Veins: No evidence of thrombus. Normal compressibility and flow on color Doppler imaging. Superficial Great Saphenous Vein: No evidence of thrombus. Normal compressibility. Venous Reflux:  None. Other Findings:  None. IMPRESSION: No evidence of deep venous thrombosis in either lower extremity. Electronically Signed   By: Marijo Conception M.D.   On: 03/30/2020 15:40   DG Chest Port 1 View  Result Date: 03/31/2020 CLINICAL DATA:  Right pleural effusion, status post thoracentesis EXAM: PORTABLE CHEST 1 VIEW COMPARISON:  03/30/2020 FINDINGS: Interval decrease in volume of a moderate right pleural effusion, now small, with associated atelectasis or consolidation. Redemonstrated mass of the suprahilar right lung. Cardiomegaly. IMPRESSION: 1. Interval decrease in volume of a moderate right pleural effusion, now small, with associated atelectasis or consolidation. No pneumothorax. 2. Redemonstrated mass of the suprahilar right lung. Electronically Signed   By: Eddie Candle M.D.   On: 03/31/2020 13:37   CT Angio Chest/Abd/Pel for Dissection W and/or Wo Contrast  Result Date: 03/30/2020 CLINICAL DATA:  Chest and back pain. EXAM: CT ANGIOGRAPHY CHEST, ABDOMEN AND PELVIS TECHNIQUE: Non-contrast CT of the chest was initially obtained. Multidetector CT imaging through the chest, abdomen and pelvis was performed using the standard protocol during bolus administration of intravenous contrast. Multiplanar reconstructed images and MIPs were obtained and reviewed to evaluate the vascular anatomy. CONTRAST:  178mL OMNIPAQUE IOHEXOL 350 MG/ML SOLN COMPARISON:  None. FINDINGS:  CTA CHEST FINDINGS Cardiovascular: Preferential opacification of the thoracic aorta. No evidence of thoracic aortic aneurysm or dissection. Normal heart size. No pericardial effusion. Mediastinum/Nodes: The esophagus is unremarkable. 6.4 x 3.7 cm right paratracheal adenopathy is noted concerning for malignancy or metastatic disease. 1.9 cm right hilar lymph node is noted. 12 mm anterior mediastinal lymph node is noted concerning for metastatic disease. 2.8 cm right thyroid nodule is noted. Lungs/Pleura: No pneumothorax is noted. Left lung is clear. Moderate size right pleural effusion is noted with associated atelectasis of the right lower lobe. Several pleural-based masses are noted posteriorly concerning for metastatic disease. Large irregular opacity is noted posteriorly in the right upper lobe which may represent malignancy or associated postobstructive atelectasis. 12 x 9 mm nodule is noted in right middle lobe concerning for metastatic disease. Musculoskeletal: Lytic lesion is noted in the manubrium consistent with metastatic disease. Review of the MIP images confirms the above findings. CTA ABDOMEN AND PELVIS FINDINGS VASCULAR Aorta: Normal caliber aorta without aneurysm, dissection, vasculitis or significant stenosis. Celiac: Patent without evidence of aneurysm, dissection, vasculitis or significant  stenosis. SMA: Patent without evidence of aneurysm, dissection, vasculitis or significant stenosis. Renals: Both renal arteries are patent without evidence of aneurysm, dissection, vasculitis, fibromuscular dysplasia or significant stenosis. IMA: Patent without evidence of aneurysm, dissection, vasculitis or significant stenosis. Inflow: Patent without evidence of aneurysm, dissection, vasculitis or significant stenosis. Veins: No obvious venous abnormality within the limitations of this arterial phase study. Review of the MIP images confirms the above findings. NON-VASCULAR Hepatobiliary: Cholelithiasis is  noted. No biliary dilatation is noted. The liver is unremarkable. Pancreas: Unremarkable. No pancreatic ductal dilatation or surrounding inflammatory changes. Spleen: Normal in size without focal abnormality. Adrenals/Urinary Tract: Adrenal glands are unremarkable. Kidneys are normal, without renal calculi, focal lesion, or hydronephrosis. Bladder is unremarkable. Stomach/Bowel: Stomach is within normal limits. Appendix appears normal. No evidence of bowel wall thickening, distention, or inflammatory changes. Lymphatic: No significant adenopathy is noted in the abdomen or pelvis. Reproductive: Mild prostatic enlargement is noted. Other: No abdominal wall hernia or abnormality. No abdominopelvic ascites. Musculoskeletal: No acute or significant osseous findings. Review of the MIP images confirms the above findings. IMPRESSION: 1. There is no evidence of thoracic or abdominal aortic dissection or aneurysm. 2. Mediastinal adenopathy is noted concerning for malignancy or metastatic disease. 3. Moderate size right pleural effusion is noted with associated atelectasis of the right lower lobe. There appears to be several pleural based masses in the right upper lung zone concerning for metastatic disease. 4. Large irregular opacity is noted posteriorly in the right upper lobe which may represent malignancy or associated postobstructive atelectasis. 5. 12 x 9 mm nodule is noted in right middle lobe concerning for metastatic disease. 6. Lytic lesion is noted in the manubrium consistent with metastatic disease. 7. Cholelithiasis. 8. Mild prostatic enlargement. 9. 2.8 cm right thyroid nodule is noted. In the setting of significant comorbidities or limited life expectancy, no follow-up recommended. (Ref: J Am Coll Radiol. 2015 Feb;12(2): 143-50). Electronically Signed   By: Marijo Conception M.D.   On: 03/30/2020 14:10   US THORACENTESIS ASP PLEURAL SPACE W/IMG GUIDE  Result Date: 03/31/2020 INDICATION: Patient with newly  discovered pleural-based masses and right pleural effusion presents today for therapeutic and diagnostic thoracentesis. EXAM: ULTRASOUND GUIDED THORACENTESIS MEDICATIONS: 1% lidocaine 10 mL COMPLICATIONS: None immediate. PROCEDURE: An ultrasound guided thoracentesis was thoroughly discussed with the patient and questions answered. The benefits, risks, alternatives and complications were also discussed. The patient understands and wishes to proceed with the procedure. Written consent was obtained. Ultrasound was performed to localize and mark an adequate pocket of fluid in the right chest. The area was then prepped and draped in the normal sterile fashion. 1% Lidocaine was used for local anesthesia. Under ultrasound guidance a 6 Fr Safe-T-Centesis catheter was introduced. Thoracentesis was performed. The catheter was removed and a dressing applied. FINDINGS: A total of approximately 1.3 L of amber-colored fluid was removed. Samples were sent to the laboratory as requested by the clinical team. IMPRESSION: Successful ultrasound guided right thoracentesis yielding 1.3 L of pleural fluid. Read by: Soyla Dryer, NP Electronically Signed   By: Miachel Roux M.D.   On: 03/31/2020 14:01     Assessment and plan- Patient is a 75 y.o. 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 Cells were positive for CD56 but negative for synaptophysin, TTF-1. Since the  diagnosis of neuroendocrine tumor is not 315% certain I will proceed with a definitive tissue diagnosis at this time. We have spoken to IR and they said they would consider a pleural biopsy but we would wait for the PET CT scan to see if there are any other more easily accessible areas. He did  have a baseline CT chest abdomen and pelvis which did not show any other areas of involvement other than manubrium.  More tissue will also allow [place] to test for NGS. Discussed with the patient and his daughter that stage IV lung cancer treatments would be palliative and not curative. If this turns out to be small cell lung cancer, treatment would involve combination of chemoimmunotherapy given IV every 3 weeks for 4 cycles followed by maintenance immunotherapy. Overall survival with extensive stage small cell stage IV lung cancer is between 1-1/2 to 2 years at best. Without treatment overall longevity would be less than 6 months. Patient is willing to proceed with treatment but I will discuss the details of chemotherapy in greater detail after pathology results from biopsy are back.  We will tentatively plan to start chemotherapy 2 weeks from now. He will need port placement as well as chemo teach prior. I will also establish referral with palliative care   Visit Diagnosis 1. Malignant neoplasm of upper lobe of right lung ([place])   2. Goals of care, counseling/discussion      Dr. Randa Evens, MD, MPH [facility] at [facility] [phone] 04/08/2020 9:31 AM

## 2020-04-11 ENCOUNTER — Ambulatory Visit
Admission: RE | Admit: 2020-04-11 | Discharge: 2020-04-11 | Disposition: A | Discharge status: Home / Self Care | Payer: Managed Care, Other (non HMO) | Source: Ambulatory Visit | Attending: Oncology | Admitting: Oncology

## 2020-04-11 ENCOUNTER — Other Ambulatory Visit: Payer: Self-pay

## 2020-04-11 ENCOUNTER — Other Ambulatory Visit: Payer: Self-pay | Admitting: *Deleted

## 2020-04-11 DIAGNOSIS — R918 Other nonspecific abnormal finding of lung field: Secondary | ICD-10-CM | POA: Diagnosis present

## 2020-04-11 DIAGNOSIS — C7951 Secondary malignant neoplasm of bone: Secondary | ICD-10-CM | POA: Diagnosis not present

## 2020-04-11 DIAGNOSIS — I700 Atherosclerosis of aorta: Secondary | ICD-10-CM | POA: Insufficient documentation

## 2020-04-11 DIAGNOSIS — I517 Cardiomegaly: Secondary | ICD-10-CM | POA: Insufficient documentation

## 2020-04-11 DIAGNOSIS — K8020 Calculus of gallbladder without cholecystitis without obstruction: Secondary | ICD-10-CM | POA: Insufficient documentation

## 2020-04-11 DIAGNOSIS — J910 Malignant pleural effusion: Secondary | ICD-10-CM | POA: Diagnosis not present

## 2020-04-11 DIAGNOSIS — J90 Pleural effusion, not elsewhere classified: Secondary | ICD-10-CM

## 2020-04-11 LAB — GLUCOSE, CAPILLARY: Glucose-Capillary: 128 mg/dL — ABNORMAL HIGH (ref 70–99)

## 2020-04-11 MED ORDER — FLUDEOXYGLUCOSE F - 18 (FDG) INJECTION
16.0990 | Freq: Once | INTRAVENOUS | Status: AC | PRN
  Administered 2020-04-11: 16.099 via INTRAVENOUS

## 2020-04-12 ENCOUNTER — Telehealth: Payer: Self-pay | Admitting: *Deleted

## 2020-04-12 NOTE — Addendum Note (Signed)
Addended by: Telford Nab on: 04/12/2020 02:57 PM   Modules accepted: Orders, SmartSet

## 2020-04-12 NOTE — Telephone Encounter (Signed)
Pt made aware of upcoming appt for biopsy and thoracentesis on Fri 2/18. Chemo edu rescheduled to Tues 2/22 per pt request. Pt informed that will be notified once scheduled for port placement prior to chemo start. Pt verbalized understanding. All questions answered during call.

## 2020-04-13 ENCOUNTER — Other Ambulatory Visit: Payer: Self-pay | Admitting: Student

## 2020-04-13 ENCOUNTER — Other Ambulatory Visit: Payer: Self-pay

## 2020-04-13 ENCOUNTER — Other Ambulatory Visit
Admission: RE | Admit: 2020-04-13 | Discharge: 2020-04-13 | Disposition: A | Discharge status: Home / Self Care | Payer: Managed Care, Other (non HMO) | Source: Ambulatory Visit | Attending: Oncology | Admitting: Oncology

## 2020-04-13 DIAGNOSIS — Z20822 Contact with and (suspected) exposure to covid-19: Secondary | ICD-10-CM | POA: Diagnosis not present

## 2020-04-13 DIAGNOSIS — Z01812 Encounter for preprocedural laboratory examination: Secondary | ICD-10-CM | POA: Diagnosis present

## 2020-04-13 LAB — SARS CORONAVIRUS 2 (TAT 6-24 HRS): SARS Coronavirus 2: NEGATIVE

## 2020-04-14 ENCOUNTER — Ambulatory Visit
Admission: RE | Admit: 2020-04-14 | Discharge: 2020-04-14 | Disposition: A | Discharge status: Home / Self Care | Payer: Managed Care, Other (non HMO) | Source: Ambulatory Visit | Attending: Oncology | Admitting: Oncology

## 2020-04-14 ENCOUNTER — Other Ambulatory Visit: Payer: Self-pay | Admitting: Oncology

## 2020-04-14 ENCOUNTER — Other Ambulatory Visit: Payer: Self-pay

## 2020-04-14 ENCOUNTER — Inpatient Hospital Stay: Payer: Managed Care, Other (non HMO)

## 2020-04-14 DIAGNOSIS — C3411 Malignant neoplasm of upper lobe, right bronchus or lung: Secondary | ICD-10-CM | POA: Diagnosis not present

## 2020-04-14 DIAGNOSIS — J90 Pleural effusion, not elsewhere classified: Secondary | ICD-10-CM | POA: Diagnosis present

## 2020-04-14 DIAGNOSIS — R599 Enlarged lymph nodes, unspecified: Secondary | ICD-10-CM | POA: Insufficient documentation

## 2020-04-14 DIAGNOSIS — Z9889 Other specified postprocedural states: Secondary | ICD-10-CM

## 2020-04-14 MED ORDER — SODIUM CHLORIDE 0.9 % IV SOLN
INTRAVENOUS | Status: DC
Start: 2020-04-14 — End: 2020-04-15

## 2020-04-14 MED ORDER — FENTANYL CITRATE (PF) 100 MCG/2ML IJ SOLN
INTRAMUSCULAR | Status: AC | PRN
  Administered 2020-04-14: 50 ug via INTRAVENOUS

## 2020-04-14 MED ORDER — MIDAZOLAM HCL 2 MG/2ML IJ SOLN
INTRAMUSCULAR | Status: AC | PRN
  Administered 2020-04-14: 0.5 mg via INTRAVENOUS

## 2020-04-14 MED ORDER — FENTANYL CITRATE (PF) 100 MCG/2ML IJ SOLN
INTRAMUSCULAR | Status: AC
  Filled 2020-04-14: qty 2

## 2020-04-14 MED ORDER — MIDAZOLAM HCL 2 MG/2ML IJ SOLN
INTRAMUSCULAR | Status: AC
  Filled 2020-04-14: qty 2

## 2020-04-14 NOTE — Progress Notes (Signed)
Patient clinically post right Pleural mass biopsy, vitals stable pre and post. Awake./alert and oriented post procedure. Report given to [facility] post procedure in specials. Denies complaints at this time.

## 2020-04-14 NOTE — Procedures (Signed)
PROCEDURE SUMMARY:  Successful US guided right thoracentesis. Yielded 1.6 L of amber-colored fluid. Pt tolerated procedure well. No immediate complications.  Specimen sent for labs. CXR ordered; no post-procedure pneumothorax identified  EBL < 1 mL  Theresa Duty, NP 04/14/2020 11:59 AM

## 2020-04-14 NOTE — Procedures (Signed)
Interventional Radiology Procedure Note  Procedure: CT RT POSTERIOR PLEURAL MASS CORE BX    Complications: None  Estimated Blood Loss:  0  Findings: 18 G CORES OBTAINED NO COMP STABLE PATH PENDING FULL REPORT IN PACS     Tamera Punt, MD

## 2020-04-14 NOTE — H&P (Signed)
Chief Complaint: Patient was seen in consultation today for   Referring Physician(s): Sindy Guadeloupe  Supervising Physician: Daryll Brod  Patient Status: [facility] - Out-pt  History of Present Illness: David Johnson is a 75 y.o. male with a medical history significant for obesity, COPD and sleep apnea. He presented to the ED 03/30/20 with complaints of worsening shortness of breath and chest pain. Imaging was obtained.   CT Angio Chest/Abd/Pelvis 03/30/20 IMPRESSION: 1. There is no evidence of thoracic or abdominal aortic dissection or aneurysm. 2. Mediastinal adenopathy is noted concerning for malignancy or metastatic disease. 3. Moderate size right pleural effusion is noted with associated atelectasis of the right lower lobe. There appears to be several pleural based masses in the right upper lung zone concerning for metastatic disease. 4. Large irregular opacity is noted posteriorly in the right upper lobe which may represent malignancy or associated postobstructive atelectasis. 5. 12 x 9 mm nodule is noted in right middle lobe concerning for metastatic disease. 6. Lytic lesion is noted in the manubrium consistent with metastatic disease. 7. Cholelithiasis. 8. Mild prostatic enlargement. 9. 2.8 cm right thyroid nodule is noted. In the setting of significant comorbidities or limited life expectancy, no follow-up Recommended.  Interventional Radiology has been asked to evaluate this patient for an image-guided biopsy of a right pleural mass. This case has been reviewed and procedure approved by Dr. Serafina Royals. The patient is familiar to IR from a prior right thoracentesis.   Past Medical History:  Diagnosis Date  . Adenomatous polyps   . Barrett's esophagus   . Colon polyp   . COPD (chronic obstructive pulmonary disease) ([place])    uses inhaler, "i may have COPD"  . Dyspnea   . Eye injury   . GERD (gastroesophageal reflux disease)   . History of gonorrhea   . History of  pneumonia   . Obesity   . Pneumonia   . Reflux esophagitis   . Sleep apnea     Past Surgical History:  Procedure Laterality Date  . CATARACT EXTRACTION Left   . COLONOSCOPY    . COLONOSCOPY WITH PROPOFOL N/A 05/19/2015   Procedure: COLONOSCOPY WITH PROPOFOL;  Surgeon: [name], MD;  Location[facility] ENDOSCOPY;  Service: Endoscopy;  Laterality: N/A;  . COLONOSCOPY WITH PROPOFOL N/A 09/11/2018   Procedure: COLONOSCOPY WITH PROPOFOL;  Surgeon: [name], MD;  Location: [facility] ENDOSCOPY;  Service: Endoscopy;  Laterality: N/A;  . ESOPHAGOGASTRODUODENOSCOPY (EGD) WITH PROPOFOL N/A 05/19/2015   Procedure: ESOPHAGOGASTRODUODENOSCOPY (EGD) WITH PROPOFOL;  Surgeon: [name], MD;  Location: [facility] ENDOSCOPY;  Service: Endoscopy;  Laterality: N/A;  . ESOPHAGOGASTRODUODENOSCOPY (EGD) WITH PROPOFOL N/A 10/11/2016   Procedure: ESOPHAGOGASTRODUODENOSCOPY (EGD) WITH PROPOFOL;  Surgeon: [name], MD;  Location: [facility] ENDOSCOPY;  Service: Endoscopy;  Laterality: N/A;  . ESOPHAGOGASTRODUODENOSCOPY (EGD) WITH PROPOFOL N/A 01/01/2018   Procedure: ESOPHAGOGASTRODUODENOSCOPY (EGD) WITH PROPOFOL;  Surgeon: [name], MD;  Location: [facility] ENDOSCOPY;  Service: Endoscopy;  Laterality: N/A;  . ESOPHAGOGASTRODUODENOSCOPY (EGD) WITH PROPOFOL N/A 09/11/2018   Procedure: ESOPHAGOGASTRODUODENOSCOPY (EGD) WITH PROPOFOL;  Surgeon: [name], MD;  Location: [facility] ENDOSCOPY;  Service: Endoscopy;  Laterality: N/A;  . EYE SURGERY      Allergies: Cephalexin  Medications: Prior to Admission medications   Medication Sig Start Date End Date Taking? Authorizing Provider  albuterol (PROVENTIL HFA;VENTOLIN HFA) 108 (90 Base) MCG/ACT inhaler 2 puffs q.i.d. p.r.n. short of breath, wheezing, or cough 07/21/16   [provider]  LORazepam (ATIVAN) 0.5  MG tablet Take 1 tablet by mouth prior to PET scan. 04/07/20   Sindy Guadeloupe, MD  neomycin-polymyxin-dexameth (MAXITROL) 0.1 % OINT  Administer 1 application into the left eye 4 (four) times a day as needed. 11/04/18   [provider]     Family History  Problem Relation Age of Onset  . Emphysema Mother     Social History   Socioeconomic History  . Marital status: Married    Spouse name: Not on file  . Number of children: Not on file  . Years of education: Not on file  . Highest education level: Not on file  Occupational History  . Not on file  Tobacco Use  . Smoking status: Current Every Day Smoker    Packs/day: 1.50    Years: 35.00    Pack years: 52.50    Types: Cigarettes, Cigars    Last attempt to quit: 03/04/2015    Years since quitting: 5.1  . Smokeless tobacco: Never Used  Vaping Use  . Vaping Use: Every day  . Substances: Flavoring  Substance and Sexual Activity  . Alcohol use: Yes    Alcohol/week: 8.0 standard drinks    Types: 8 Cans of beer per week    Comment: Occassionally on weekends   . Drug use: No  . Sexual activity: Not on file  Other Topics Concern  . Not on file  Social History Narrative  . Not on file   Social Determinants of Health   Financial Resource Strain: Not on file  Food Insecurity: Not on file  Transportation Needs: Not on file  Physical Activity: Not on file  Stress: Not on file  Social Connections: Not on file    Review of Systems: A 12 point ROS discussed and pertinent positives are indicated in the HPI above.  All other systems are negative.  Review of Systems  Constitutional: Positive for fatigue. Negative for appetite change.  Respiratory: Positive for shortness of breath. Negative for cough.   Cardiovascular: Negative for chest pain and leg swelling.  Gastrointestinal: Negative for abdominal pain, diarrhea, nausea and vomiting.  Neurological: Negative for dizziness and headaches.    Vital Signs: BP 107/81   Pulse 77   Temp 98 F (36.7 C) (Oral)   Resp (!) 28   Ht 6' (1.829 m)   Wt (!) 345 lb (156.5 kg)   SpO2 98%   BMI 46.79 kg/m    Physical Exam Constitutional:      General: He is not in acute distress.    Appearance: He is obese.  HENT:     Mouth/Throat:     Mouth: Mucous membranes are moist.     Pharynx: Oropharynx is clear.  Cardiovascular:     Rate and Rhythm: Normal rate and regular rhythm.     Pulses: Normal pulses.  Pulmonary:     Effort: Pulmonary effort is normal.     Breath sounds: Decreased air movement present.  Abdominal:     General: Abdomen is protuberant. Bowel sounds are normal.     Palpations: Abdomen is soft.  Musculoskeletal:        General: Normal Johnson of motion.  Skin:    General: Skin is warm and dry.  Neurological:     Mental Status: He is alert and oriented to person, place, and time.  Psychiatric:        Mood and Affect: Mood normal.        Behavior: Behavior normal.  Thought Content: Thought content normal.        Judgment: Judgment normal.     Imaging: DG Chest 2 View  Result Date: 03/30/2020 CLINICAL DATA:  Pain and stiffness EXAM: CHEST - 2 VIEW COMPARISON:  June 03, 2016 FINDINGS: Cardiomediastinal silhouette is enlarged in contour. There is a rounded nodular opacity projecting along the RIGHT superior hilar border. Possible thickening of the RIGHT paratracheal stripe. There is a moderate RIGHT pleural effusion. No significant LEFT pleural effusion. Homogeneous opacification of the RIGHT lower lung. Mild diffuse interstitial prominence. Visualized abdomen is unremarkable. Mild degenerative changes of the thoracic spine. IMPRESSION: 1. Rounded nodular opacity projecting along the RIGHT superior hilar border with possible thickening of the RIGHT paratracheal stripe. Consider further evaluation with dedicated cross-sectional imaging. 2. Moderate RIGHT pleural effusion. 3. Mild pulmonary edema. Homogeneous opacification of the RIGHT lower lung, most likely atelectasis. Electronically Signed   By: Valentino Saxon MD   On: 03/30/2020 10:28   MR BRAIN W WO  CONTRAST  Result Date: 04/01/2020 CLINICAL DATA:  Non-small cell lung cancer staging. EXAM: MRI HEAD WITHOUT AND WITH CONTRAST TECHNIQUE: Multiplanar, multiecho pulse sequences of the brain and surrounding structures were obtained without and with intravenous contrast. CONTRAST:  23mL GADAVIST GADOBUTROL 1 MMOL/ML IV SOLN COMPARISON:  None. FINDINGS: Brain: No acute infarction, hemorrhage, hydrocephalus, extra-axial collection or mass lesion. Diffuse cerebral atrophy with prominence of the extra-axial spaces along the frontal convexity. No abnormal enhancement. Vascular: Major arterial flow voids are maintained at the skull base. Skull and upper cervical spine: Normal marrow signal. Sinuses/Orbits: Sinuses are largely clear. Other: Nonspecific 1 cm mildly T2 hyperintense, T1 isointense lesion within the left temporal bone (series 12, image 65). IMPRESSION: 1. No evidence of acute abnormality or intraparenchymal metastatic disease. 2. Nonspecific 1 cm osseous lesion within the left temporal bone. This could represent a benign process such as fibrous dysplasia, but is nonspecific by MRI. If there is no diagnostic prior outside imaging, recommend CT of the temporal bone with contrast to further evaluate and exclude osseous metastatic disease. Electronically Signed   By: Margaretha Sheffield MD   On: 04/01/2020 10:04   NM PET Image Initial (PI) Skull Base To Thigh  Result Date: 04/11/2020 CLINICAL DATA:  Initial treatment strategy for right middle lobe lung nodule and mediastinal adenopathy. Abnormal opacity in the right upper lobe, possible mass. EXAM: NUCLEAR MEDICINE PET SKULL BASE TO THIGH TECHNIQUE: 16.1 mCi F-18 FDG was injected intravenously. Full-ring PET imaging was performed from the skull base to thigh after the radiotracer. CT data was obtained and used for attenuation correction and anatomic localization. Fasting blood glucose: One hundred twenty-eight mg/dl COMPARISON:  Chest CT 03/30/2020 FINDINGS:  Mediastinal blood pool activity: SUV max 3.0 Liver activity: SUV max NA NECK: Diffuse thyroid activity likely from thyroiditis. Mildly prominent right thyroid lobe. Maximum SUV of the right thyroid lobe is 10.2 and the left thyroid lobe 7.3. No definite hypermetabolic activity in the vicinity of the small left temporal bone lesion shown on recent MRI. Incidental CT findings: None CHEST: Abnormal bandlike lesion in the right upper lobe measuring about 6.8 by 2.4 cm, maximum SUV 10.9, high suspicion for malignancy. Large right pleural effusion with hypermetabolic rind of pleural tissue measuring up to about 1.4 cm in thickness on image 93 series 3, maximum SUV of this rind 13.8, compatible with malignant pleural effusion. Conglomerate right paratracheal adenopathy measuring about 3.8 by 5.9 cm on image 106 series 3, maximum SUV 17.1, compatible with  malignancy. A right hilar node measuring 1.5 cm in short axis on image 119 of series 3 has a maximum SUV of 10.0, compatible with malignancy. Hypermetabolic lymph nodes are present in the right pericardial space. One of several hypermetabolic lymph nodes measures 1.4 cm in short axis on image 147 of series 3 with maximum SUV 9.0 1.3 by 1.0 cm right middle lobe nodule with adjacent mild nodularity noted just below the minor fissure, maximum SUV 3.7, compatible with malignancy. Hypermetabolic activity along the collapsed right lower lobe could be within the lower lobe for along the visceral pleural margin, measuring about 1.2 by 1.8 cm on image 149 series 3 with maximum SUV 8.3. Incidental CT findings: Large malignant right pleural effusion. Mild cardiomegaly. Mild atherosclerotic calcification of the aortic arch. ABDOMEN/PELVIS: I do not see a discrete adrenal mass but there is some faintly accentuated activity along the left adrenal gland with maximum SUV 7.0. Speckled or noisy PET signal in the abdomen. Incidental CT findings: Cholelithiasis mild abdominal aortic  atherosclerosis. SKELETON: There are few scattered skeletal metastatic lesions. This includes a lytic lesion of the sternal manubrium measuring approximately 2.9 by 1.8 cm on image 91 of series 3, maximum SUV 7.6. 1.4 by 1.1 cm lytic lesion left iliac crest on image 237 series 3, maximum SUV 8.1. A small hypermetabolic lesion in the right femoral neck has a maximum SUV of 9.7 but is poorly delineated on the CT data. Speckled/noisy signal along the spine without well-defined metastatic involvement of the spine. Activity along the left proximal humerus is thought to probably be in the joint rather than in the humeral head, and accordingly probably physiologic. There is a small focus of hypermetabolic activity in the right posterior iliac bone without CT correlate, maximum SUV 7.3. There is a hypermetabolic lesion in the left mid humeral shaft centrally, maximum SUV 6.5. Incidental CT findings: Thoracic spondylosis. IMPRESSION: 1. Hypermetabolic right upper lobe mass with a few scattered osseous metastatic lesions including the sternal manubrium, bony pelvis, left mid humerus, and right proximal femur. Large conglomerate right paratracheal adenopathy with hypermetabolic right hilar adenopathy, a large malignant right pleural effusion with hypermetabolic pleural rind, and hypermetabolic pericardial lymph nodes. 2. The 1.3 by 1.0 cm right middle lobe pulmonary nodule is weakly hypermetabolic and probably a metastatic lesion. 3. Faintly accentuated activity in the left adrenal gland without a well-defined mass, indeterminate. 4. 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 Somewhat prominent right thyroid lobe compared to the left. 5. Other imaging findings of potential clinical significance: Aortic Atherosclerosis (ICD10-I70.0). Mild cardiomegaly. Cholelithiasis. Electronically Signed   By: Van Clines M.D.   On: 04/11/2020 14:13   US Venous Img Lower Bilateral  Result Date: 03/30/2020 CLINICAL DATA:   Shortness of breath, elevated D-dimer level. EXAM: BILATERAL LOWER EXTREMITY VENOUS DOPPLER ULTRASOUND TECHNIQUE: Gray-scale sonography with graded compression, as well as color Doppler and duplex ultrasound were performed to evaluate the lower extremity deep venous systems from the level of the common femoral vein and including the common femoral, femoral, profunda femoral, popliteal and calf veins including the posterior tibial, peroneal and gastrocnemius veins when visible. The superficial great saphenous vein was also interrogated. Spectral Doppler was utilized to evaluate flow at rest and with distal augmentation maneuvers in the common femoral, femoral and popliteal veins. COMPARISON:  November 26, 2013. FINDINGS: RIGHT LOWER EXTREMITY Common Femoral Vein: No evidence of thrombus. Normal compressibility, respiratory phasicity and response to augmentation. Saphenofemoral Junction: No evidence of thrombus. Normal compressibility and flow on  color Doppler imaging. Profunda Femoral Vein: No evidence of thrombus. Normal compressibility and flow on color Doppler imaging. Femoral Vein: No evidence of thrombus. Normal compressibility, respiratory phasicity and response to augmentation. Popliteal Vein: No evidence of thrombus. Normal compressibility, respiratory phasicity and response to augmentation. Calf Veins: No evidence of thrombus. Normal compressibility and flow on color Doppler imaging. Superficial Great Saphenous Vein: No evidence of thrombus. Normal compressibility. Venous Reflux:  None. Other Findings:  None. LEFT LOWER EXTREMITY Common Femoral Vein: No evidence of thrombus. Normal compressibility, respiratory phasicity and response to augmentation. Saphenofemoral Junction: No evidence of thrombus. Normal compressibility and flow on color Doppler imaging. Profunda Femoral Vein: No evidence of thrombus. Normal compressibility and flow on color Doppler imaging. Femoral Vein: No evidence of thrombus. Normal  compressibility, respiratory phasicity and response to augmentation. Popliteal Vein: No evidence of thrombus. Normal compressibility, respiratory phasicity and response to augmentation. Calf Veins: No evidence of thrombus. Normal compressibility and flow on color Doppler imaging. Superficial Great Saphenous Vein: No evidence of thrombus. Normal compressibility. Venous Reflux:  None. Other Findings:  None. IMPRESSION: No evidence of deep venous thrombosis in either lower extremity. Electronically Signed   By: Marijo Conception M.D.   On: 03/30/2020 15:40   DG Chest Port 1 View  Result Date: 03/31/2020 CLINICAL DATA:  Right pleural effusion, status post thoracentesis EXAM: PORTABLE CHEST 1 VIEW COMPARISON:  03/30/2020 FINDINGS: Interval decrease in volume of a moderate right pleural effusion, now small, with associated atelectasis or consolidation. Redemonstrated mass of the suprahilar right lung. Cardiomegaly. IMPRESSION: 1. Interval decrease in volume of a moderate right pleural effusion, now small, with associated atelectasis or consolidation. No pneumothorax. 2. Redemonstrated mass of the suprahilar right lung. Electronically Signed   By: Eddie Candle M.D.   On: 03/31/2020 13:37   CT Angio Chest/Abd/Pel for Dissection W and/or Wo Contrast  Result Date: 03/30/2020 CLINICAL DATA:  Chest and back pain. EXAM: CT ANGIOGRAPHY CHEST, ABDOMEN AND PELVIS TECHNIQUE: Non-contrast CT of the chest was initially obtained. Multidetector CT imaging through the chest, abdomen and pelvis was performed using the standard protocol during bolus administration of intravenous contrast. Multiplanar reconstructed images and MIPs were obtained and reviewed to evaluate the vascular anatomy. CONTRAST:  149mL OMNIPAQUE IOHEXOL 350 MG/ML SOLN COMPARISON:  None. FINDINGS: CTA CHEST FINDINGS Cardiovascular: Preferential opacification of the thoracic aorta. No evidence of thoracic aortic aneurysm or dissection. Normal heart size. No  pericardial effusion. Mediastinum/Nodes: The esophagus is unremarkable. 6.4 x 3.7 cm right paratracheal adenopathy is noted concerning for malignancy or metastatic disease. 1.9 cm right hilar lymph node is noted. 12 mm anterior mediastinal lymph node is noted concerning for metastatic disease. 2.8 cm right thyroid nodule is noted. Lungs/Pleura: No pneumothorax is noted. Left lung is clear. Moderate size right pleural effusion is noted with associated atelectasis of the right lower lobe. Several pleural-based masses are noted posteriorly concerning for metastatic disease. Large irregular opacity is noted posteriorly in the right upper lobe which may represent malignancy or associated postobstructive atelectasis. 12 x 9 mm nodule is noted in right middle lobe concerning for metastatic disease. Musculoskeletal: Lytic lesion is noted in the manubrium consistent with metastatic disease. Review of the MIP images confirms the above findings. CTA ABDOMEN AND PELVIS FINDINGS VASCULAR Aorta: Normal caliber aorta without aneurysm, dissection, vasculitis or significant stenosis. Celiac: Patent without evidence of aneurysm, dissection, vasculitis or significant stenosis. SMA: Patent without evidence of aneurysm, dissection, vasculitis or significant stenosis. Renals: Both renal arteries are patent  without evidence of aneurysm, dissection, vasculitis, fibromuscular dysplasia or significant stenosis. IMA: Patent without evidence of aneurysm, dissection, vasculitis or significant stenosis. Inflow: Patent without evidence of aneurysm, dissection, vasculitis or significant stenosis. Veins: No obvious venous abnormality within the limitations of this arterial phase study. Review of the MIP images confirms the above findings. NON-VASCULAR Hepatobiliary: Cholelithiasis is noted. No biliary dilatation is noted. The liver is unremarkable. Pancreas: Unremarkable. No pancreatic ductal dilatation or surrounding inflammatory changes. Spleen:  Normal in size without focal abnormality. Adrenals/Urinary Tract: Adrenal glands are unremarkable. Kidneys are normal, without renal calculi, focal lesion, or hydronephrosis. Bladder is unremarkable. Stomach/Bowel: Stomach is within normal limits. Appendix appears normal. No evidence of bowel wall thickening, distention, or inflammatory changes. Lymphatic: No significant adenopathy is noted in the abdomen or pelvis. Reproductive: Mild prostatic enlargement is noted. Other: No abdominal wall hernia or abnormality. No abdominopelvic ascites. Musculoskeletal: No acute or significant osseous findings. Review of the MIP images confirms the above findings. IMPRESSION: 1. There is no evidence of thoracic or abdominal aortic dissection or aneurysm. 2. Mediastinal adenopathy is noted concerning for malignancy or metastatic disease. 3. Moderate size right pleural effusion is noted with associated atelectasis of the right lower lobe. There appears to be several pleural based masses in the right upper lung zone concerning for metastatic disease. 4. Large irregular opacity is noted posteriorly in the right upper lobe which may represent malignancy or associated postobstructive atelectasis. 5. 12 x 9 mm nodule is noted in right middle lobe concerning for metastatic disease. 6. Lytic lesion is noted in the manubrium consistent with metastatic disease. 7. Cholelithiasis. 8. Mild prostatic enlargement. 9. 2.8 cm right thyroid nodule is noted. In the setting of significant comorbidities or limited life expectancy, no follow-up recommended. (Ref: J Am Coll Radiol. 2015 Feb;12(2): 143-50). Electronically Signed   By: Marijo Conception M.D.   On: 03/30/2020 14:10   US THORACENTESIS ASP PLEURAL SPACE W/IMG GUIDE  Result Date: 03/31/2020 INDICATION: Patient with newly discovered pleural-based masses and right pleural effusion presents today for therapeutic and diagnostic thoracentesis. EXAM: ULTRASOUND GUIDED THORACENTESIS MEDICATIONS:  1% lidocaine 10 mL COMPLICATIONS: None immediate. PROCEDURE: An ultrasound guided thoracentesis was thoroughly discussed with the patient and questions answered. The benefits, risks, alternatives and complications were also discussed. The patient understands and wishes to proceed with the procedure. Written consent was obtained. Ultrasound was performed to localize and mark an adequate pocket of fluid in the right chest. The area was then prepped and draped in the normal sterile fashion. 1% Lidocaine was used for local anesthesia. Under ultrasound guidance a 6 Fr Safe-T-Centesis catheter was introduced. Thoracentesis was performed. The catheter was removed and a dressing applied. FINDINGS: A total of approximately 1.3 L of amber-colored fluid was removed. Samples were sent to the laboratory as requested by the clinical team. IMPRESSION: Successful ultrasound guided right thoracentesis yielding 1.3 L of pleural fluid. Read by: Soyla Dryer, NP Electronically Signed   By: Miachel Roux M.D.   On: 03/31/2020 14:01    Labs:  CBC: Recent Labs    03/30/20 1035 03/31/20 0514 04/01/20 0424  WBC 13.3* 17.4* 20.4*  HGB 19.1* 18.8* 18.8*  HCT 55.7* 55.9* 55.6*  PLT 201 212 219    COAGS: Recent Labs    03/31/20 0514  INR 1.0    BMP: Recent Labs    03/30/20 1035 03/31/20 0514 04/01/20 0424  NA 139 137 139  K 4.4 4.2 4.7  CL 105 102 105  CO2  26 22 24   GLUCOSE 116* 166* 125*  BUN 11 17 22   CALCIUM 9.2 9.2 9.3  CREATININE 0.74 0.82 1.04  GFRNONAA >60 >60 >60    LIVER FUNCTION TESTS: Recent Labs    03/30/20 1035 03/31/20 0514  BILITOT 0.9 0.7  AST 36 31  ALT 55* 48*  ALKPHOS 66 68  PROT 7.2 7.0  ALBUMIN 3.9 3.7    TUMOR MARKERS: No results for input(s): AFPTM, CEA, CA199, CHROMGRNA in the last 8760 hours.  Assessment and Plan:  Suspected right lung cancer: David Johnson, 75 year old male, presents today to the [facility] Interventional Radiology  department for an image-guided biopsy of a right pleural mass. He underwent a thoracentesis prior to the procedure and this yielded 1600 ml of amber-colored fluid.   Risks and benefits of CT-guided pleural mass biopsy were discussed with the patient including, but not limited to bleeding, hemoptysis, respiratory failure requiring intubation, infection, pneumothorax requiring chest tube placement, stroke from air embolism or even death.  All of the patient's questions were answered and the patient is agreeable to proceed. He has been NPO. Vitals and prior labs have been reviewed.   Consent signed and in chart.  Thank you for this interesting consult.  I greatly enjoyed meeting David Johnson and look forward to participating in their care.  A copy of this report was sent to the requesting provider on this date.  Electronically Signed: Soyla Dryer, AGACNP-BC [phone] 04/14/2020, 11:37 AM   I spent a total of  30 Minutes   in face to face in clinical consultation, greater than 50% of which was counseling/coordinating care for image-guided biopsy of right pleural mass.

## 2020-04-17 ENCOUNTER — Encounter: Payer: Self-pay | Admitting: *Deleted

## 2020-04-17 ENCOUNTER — Other Ambulatory Visit: Payer: Self-pay | Admitting: *Deleted

## 2020-04-18 ENCOUNTER — Encounter: Payer: Self-pay | Admitting: *Deleted

## 2020-04-18 ENCOUNTER — Inpatient Hospital Stay: Payer: Managed Care, Other (non HMO)

## 2020-04-18 ENCOUNTER — Other Ambulatory Visit: Payer: Self-pay | Admitting: Oncology

## 2020-04-18 DIAGNOSIS — C3491 Malignant neoplasm of unspecified part of right bronchus or lung: Secondary | ICD-10-CM | POA: Insufficient documentation

## 2020-04-18 DIAGNOSIS — Z7189 Other specified counseling: Secondary | ICD-10-CM

## 2020-04-18 LAB — SURGICAL PATHOLOGY

## 2020-04-18 LAB — CYTOLOGY - NON PAP

## 2020-04-18 MED ORDER — PROCHLORPERAZINE MALEATE 10 MG PO TABS: 10.0000 mg | ORAL_TABLET | Freq: Four times a day (QID) | ORAL | 1 refills | Status: AC | PRN

## 2020-04-18 MED ORDER — ONDANSETRON HCL 8 MG PO TABS: 8.0000 mg | ORAL_TABLET | Freq: Two times a day (BID) | ORAL | 1 refills | Status: AC | PRN

## 2020-04-18 MED ORDER — LIDOCAINE-PRILOCAINE 2.5-2.5 % EX CREA: TOPICAL_CREAM | CUTANEOUS | 3 refills | Status: DC

## 2020-04-18 MED ORDER — LORAZEPAM 0.5 MG PO TABS: 0.5000 mg | ORAL_TABLET | Freq: Four times a day (QID) | ORAL | 0 refills | Status: DC | PRN

## 2020-04-18 NOTE — Progress Notes (Signed)
START ON PATHWAY REGIMEN - Small Cell Lung     Cycles 1 through 4, every 21 days:     Atezolizumab      Carboplatin      Etoposide    Cycles 5 and beyond, every 21 days:     Atezolizumab   **Always confirm dose/schedule in your pharmacy ordering system**  Patient Characteristics: Newly Diagnosed, Preoperative or Nonsurgical Candidate (Clinical Staging), First Line, Extensive Stage Therapeutic Status: Newly Diagnosed, Preoperative or Nonsurgical Candidate (Clinical Staging) AJCC T Category: cT3 AJCC N Category: cN2 AJCC M Category: pM1c AJCC 8 Stage Grouping: IVB Stage Classification: Extensive  Intent of Therapy: Non-Curative / Palliative Intent, Discussed with Patient

## 2020-04-18 NOTE — Progress Notes (Signed)
Patient on schedule for Port placement 2/25, called and spoke with patient on phone with instructions given. Made aware to be here @ 0830, NPO after [place] prior to procedure and driver post procedure./discharge/recovery. stated understanding.

## 2020-04-19 ENCOUNTER — Telehealth: Payer: Self-pay | Admitting: *Deleted

## 2020-04-19 ENCOUNTER — Other Ambulatory Visit: Payer: Self-pay | Admitting: Radiology

## 2020-04-19 ENCOUNTER — Other Ambulatory Visit: Payer: Self-pay | Admitting: *Deleted

## 2020-04-19 DIAGNOSIS — C3491 Malignant neoplasm of unspecified part of right bronchus or lung: Secondary | ICD-10-CM

## 2020-04-19 MED ORDER — LORAZEPAM 0.5 MG PO TABS: 0.5000 mg | ORAL_TABLET | Freq: Three times a day (TID) | ORAL | 0 refills | Status: DC | PRN

## 2020-04-19 NOTE — Telephone Encounter (Signed)
Called pharmacy and let them know that pt insurance will only approve tid prn for ativan. It was changed and it the future we can give him a bigger quantity but maximum of 3 a day if he needs it. Pt was sent a my chart message about these details. Pt had previously sent a my chart message about the issue in the beginning

## 2020-04-19 NOTE — Patient Instructions (Signed)
Atezolizumab injection What is this medicine? ATEZOLIZUMAB (a te zoe LIZ ue mab) is a monoclonal antibody. It is used to treat bladder cancer (urothelial cancer), liver cancer, lung cancer, and melanoma. This medicine may be used for other purposes; ask your health care provider or pharmacist if you have questions. COMMON BRAND NAME(S): Tecentriq What should I tell my health care provider before I take this medicine? They need to know if you have any of these conditions:  autoimmune diseases like Crohn's disease, ulcerative colitis, or lupus  have had or planning to have an allogeneic stem cell transplant (uses someone else's stem cells)  history of organ transplant  history of radiation to the chest  nervous system problems like myasthenia gravis or Guillain-Barre syndrome  an unusual or allergic reaction to atezolizumab, other medicines, foods, dyes, or preservatives  pregnant or trying to get pregnant  breast-feeding How should I use this medicine? This medicine is for infusion into a vein. It is given by a health care professional in a hospital or clinic setting. A special MedGuide will be given to you before each treatment. Be sure to read this information carefully each time. Talk to your pediatrician regarding the use of this medicine in children. Special care may be needed. Overdosage: If you think you have taken too much of this medicine contact a poison control center or emergency room at once. NOTE: This medicine is only for you. Do not share this medicine with others. What if I miss a dose? It is important not to miss your dose. Call your doctor or health care professional if you are unable to keep an appointment. What may interact with this medicine? Interactions have not been studied. This list may not describe all possible interactions. Give your health care provider a list of all the medicines, herbs, non-prescription drugs, or dietary supplements you use. Also tell  them if you smoke, drink alcohol, or use illegal drugs. Some items may interact with your medicine. What should I watch for while using this medicine? Your condition will be monitored carefully while you are receiving this medicine. You may need blood work done while you are taking this medicine. Do not become pregnant while taking this medicine or for at least 5 months after stopping it. Women should inform their doctor if they wish to become pregnant or think they might be pregnant. There is a potential for serious side effects to an unborn child. Talk to your health care professional or pharmacist for more information. Do not breast-feed an infant while taking this medicine or for at least 5 months after the last dose. What side effects may I notice from receiving this medicine? Side effects that you should report to your doctor or health care professional as soon as possible:  allergic reactions like skin rash, itching or hives, swelling of the face, lips, or tongue  black, tarry stools  bloody or watery diarrhea  breathing problems  changes in vision  chest pain or chest tightness  chills  facial flushing  fever  headache  signs and symptoms of high blood sugar such as dizziness; dry mouth; dry skin; fruity breath; nausea; stomach pain; increased hunger or thirst; increased urination  signs and symptoms of liver injury like dark yellow or brown urine; general ill feeling or flu-like symptoms; light-colored stools; loss of appetite; nausea; right upper belly pain; unusually weak or tired; yellowing of the eyes or skin  stomach pain  trouble passing urine or change in the amount of   urine Side effects that usually do not require medical attention (report to your doctor or health care professional if they continue or are bothersome):  bone pain  cough  diarrhea  joint pain  muscle pain  muscle weakness  swelling of arms or legs  tiredness  weight loss This list  may not describe all possible side effects. Call your doctor for medical advice about side effects. You may report side effects to FDA at 1-800-FDA-1088. Where should I keep my medicine? This drug is given in a hospital or clinic and will not be stored at home. NOTE: This sheet is a summary. It may not cover all possible information. If you have questions about this medicine, talk to your doctor, pharmacist, or health care provider.  2021 Elsevier/Gold Standard (2019-11-11 13:59:34) Etoposide, VP-16 injection What is this medicine? ETOPOSIDE, VP-16 (e toe POE side) is a chemotherapy drug. It is used to treat testicular cancer, lung cancer, and other cancers. This medicine may be used for other purposes; ask your health care provider or pharmacist if you have questions. COMMON BRAND NAME(S): Etopophos, Toposar, VePesid What should I tell my health care provider before I take this medicine? They need to know if you have any of these conditions:  infection  kidney disease  liver disease  low blood counts, like low white cell, platelet, or red cell counts  an unusual or allergic reaction to etoposide, other medicines, foods, dyes, or preservatives  pregnant or trying to get pregnant  breast-feeding How should I use this medicine? This medicine is for infusion into a vein. It is administered in a hospital or clinic by a specially trained health care professional. Talk to your pediatrician regarding the use of this medicine in children. Special care may be needed. Overdosage: If you think you have taken too much of this medicine contact a poison control center or emergency room at once. NOTE: This medicine is only for you. Do not share this medicine with others. What if I miss a dose? It is important not to miss your dose. Call your doctor or health care professional if you are unable to keep an appointment. What may interact with this medicine? This medicine may interact with the  following medications:  warfarin This list may not describe all possible interactions. Give your health care provider a list of all the medicines, herbs, non-prescription drugs, or dietary supplements you use. Also tell them if you smoke, drink alcohol, or use illegal drugs. Some items may interact with your medicine. What should I watch for while using this medicine? Visit your doctor for checks on your progress. This drug may make you feel generally unwell. This is not uncommon, as chemotherapy can affect healthy cells as well as cancer cells. Report any side effects. Continue your course of treatment even though you feel ill unless your doctor tells you to stop. In some cases, you may be given additional medicines to help with side effects. Follow all directions for their use. Call your doctor or health care professional for advice if you get a fever, chills or sore throat, or other symptoms of a cold or flu. Do not treat yourself. This drug decreases your body's ability to fight infections. Try to avoid being around people who are sick. This medicine may increase your risk to bruise or bleed. Call your doctor or health care professional if you notice any unusual bleeding. Talk to your doctor about your risk of cancer. You may be more at risk for   certain types of cancers if you take this medicine. Do not become pregnant while taking this medicine or for at least 6 months after stopping it. Women should inform their doctor if they wish to become pregnant or think they might be pregnant. Women of child-bearing potential will need to have a negative pregnancy test before starting this medicine. There is a potential for serious side effects to an unborn child. Talk to your health care professional or pharmacist for more information. Do not breast-feed an infant while taking this medicine. Men must use a latex condom during sexual contact with a woman while taking this medicine and for at least 4 months  after stopping it. A latex condom is needed even if you have had a vasectomy. Contact your doctor right away if your partner becomes pregnant. Do not donate sperm while taking this medicine and for at least 4 months after you stop taking this medicine. Men should inform their doctors if they wish to father a child. This medicine may lower sperm counts. What side effects may I notice from receiving this medicine? Side effects that you should report to your doctor or health care professional as soon as possible:  allergic reactions like skin rash, itching or hives, swelling of the face, lips, or tongue  low blood counts - this medicine may decrease the number of white blood cells, red blood cells, and platelets. You may be at increased risk for infections and bleeding  nausea, vomiting  redness, blistering, peeling or loosening of the skin, including inside the mouth  signs and symptoms of infection like fever; chills; cough; sore throat; pain or trouble passing urine  signs and symptoms of low red blood cells or anemia such as unusually weak or tired; feeling faint or lightheaded; falls; breathing problems  unusual bruising or bleeding Side effects that usually do not require medical attention (report to your doctor or health care professional if they continue or are bothersome):  changes in taste  diarrhea  hair loss  loss of appetite  mouth sores This list may not describe all possible side effects. Call your doctor for medical advice about side effects. You may report side effects to FDA at 1-800-FDA-1088. Where should I keep my medicine? This drug is given in a hospital or clinic and will not be stored at home. NOTE: This sheet is a summary. It may not cover all possible information. If you have questions about this medicine, talk to your doctor, pharmacist, or health care provider.  2021 Elsevier/Gold Standard (2018-04-08 16:57:15) Carboplatin injection What is this  medicine? CARBOPLATIN (KAR boe pla tin) is a chemotherapy drug. It targets fast dividing cells, like cancer cells, and causes these cells to die. This medicine is used to treat ovarian cancer and many other cancers. This medicine may be used for other purposes; ask your health care provider or pharmacist if you have questions. COMMON BRAND NAME(S): Paraplatin What should I tell my health care provider before I take this medicine? They need to know if you have any of these conditions:  blood disorders  hearing problems  kidney disease  recent or ongoing radiation therapy  an unusual or allergic reaction to carboplatin, cisplatin, other chemotherapy, other medicines, foods, dyes, or preservatives  pregnant or trying to get pregnant  breast-feeding How should I use this medicine? This drug is usually given as an infusion into a vein. It is administered in a hospital or clinic by a specially trained health care professional. Talk to your   pediatrician regarding the use of this medicine in children. Special care may be needed. Overdosage: If you think you have taken too much of this medicine contact a poison control center or emergency room at once. NOTE: This medicine is only for you. Do not share this medicine with others. What if I miss a dose? It is important not to miss a dose. Call your doctor or health care professional if you are unable to keep an appointment. What may interact with this medicine?  medicines for seizures  medicines to increase blood counts like filgrastim, pegfilgrastim, sargramostim  some antibiotics like amikacin, gentamicin, neomycin, streptomycin, tobramycin  vaccines Talk to your doctor or health care professional before taking any of these medicines:  acetaminophen  aspirin  ibuprofen  ketoprofen  naproxen This list may not describe all possible interactions. Give your health care provider a list of all the medicines, herbs, non-prescription  drugs, or dietary supplements you use. Also tell them if you smoke, drink alcohol, or use illegal drugs. Some items may interact with your medicine. What should I watch for while using this medicine? Your condition will be monitored carefully while you are receiving this medicine. You will need important blood work done while you are taking this medicine. This drug may make you feel generally unwell. This is not uncommon, as chemotherapy can affect healthy cells as well as cancer cells. Report any side effects. Continue your course of treatment even though you feel ill unless your doctor tells you to stop. In some cases, you may be given additional medicines to help with side effects. Follow all directions for their use. Call your doctor or health care professional for advice if you get a fever, chills or sore throat, or other symptoms of a cold or flu. Do not treat yourself. This drug decreases your body's ability to fight infections. Try to avoid being around people who are sick. This medicine may increase your risk to bruise or bleed. Call your doctor or health care professional if you notice any unusual bleeding. Be careful brushing and flossing your teeth or using a toothpick because you may get an infection or bleed more easily. If you have any dental work done, tell your dentist you are receiving this medicine. Avoid taking products that contain aspirin, acetaminophen, ibuprofen, naproxen, or ketoprofen unless instructed by your doctor. These medicines may hide a fever. Do not become pregnant while taking this medicine. Women should inform their doctor if they wish to become pregnant or think they might be pregnant. There is a potential for serious side effects to an unborn child. Talk to your health care professional or pharmacist for more information. Do not breast-feed an infant while taking this medicine. What side effects may I notice from receiving this medicine? Side effects that you should  report to your doctor or health care professional as soon as possible:  allergic reactions like skin rash, itching or hives, swelling of the face, lips, or tongue  signs of infection - fever or chills, cough, sore throat, pain or difficulty passing urine  signs of decreased platelets or bleeding - bruising, pinpoint red spots on the skin, black, tarry stools, nosebleeds  signs of decreased red blood cells - unusually weak or tired, fainting spells, lightheadedness  breathing problems  changes in hearing  changes in vision  chest pain  high blood pressure  low blood counts - This drug may decrease the number of white blood cells, red blood cells and platelets. You may be   at increased risk for infections and bleeding.  nausea and vomiting  pain, swelling, redness or irritation at the injection site  pain, tingling, numbness in the hands or feet  problems with balance, talking, walking  trouble passing urine or change in the amount of urine Side effects that usually do not require medical attention (report to your doctor or health care professional if they continue or are bothersome):  hair loss  loss of appetite  metallic taste in the mouth or changes in taste This list may not describe all possible side effects. Call your doctor for medical advice about side effects. You may report side effects to FDA at 1-800-FDA-1088. Where should I keep my medicine? This drug is given in a hospital or clinic and will not be stored at home. NOTE: This sheet is a summary. It may not cover all possible information. If you have questions about this medicine, talk to your doctor, pharmacist, or health care provider.  2021 Elsevier/Gold Standard (2007-05-19 14:38:05)  

## 2020-04-20 ENCOUNTER — Inpatient Hospital Stay (HOSPITAL_BASED_OUTPATIENT_CLINIC_OR_DEPARTMENT_OTHER): Payer: Managed Care, Other (non HMO) | Admitting: Nurse Practitioner

## 2020-04-20 ENCOUNTER — Inpatient Hospital Stay: Payer: Managed Care, Other (non HMO)

## 2020-04-20 ENCOUNTER — Other Ambulatory Visit: Payer: Managed Care, Other (non HMO)

## 2020-04-20 DIAGNOSIS — C3491 Malignant neoplasm of unspecified part of right bronchus or lung: Secondary | ICD-10-CM

## 2020-04-20 NOTE — Progress Notes (Addendum)
Tumor Board Documentation  David Johnson was presented by Dr Janese Banks at our Tumor Board on 04/20/2020, which included representatives from medical oncology,radiation oncology,internal medicine,navigation,pathology,radiology,surgical,pharmacy,genetics,research,palliative care,pulmonology.  David Johnson currently presents as a current patient,for MDC,for new positive pathology with history of the following treatments: active survellience,surgical intervention(s).  Additionally, we reviewed previous medical and familial history, history of present illness, and recent lab results along with all available histopathologic and imaging studies. The tumor board considered available treatment options and made the following recommendation Combination palliative chemotherapy and immunotherapy    The following procedures/referrals were also placed: No orders of the defined types were placed in this encounter.   Clinical Trial Status: not discussed   Staging used: AJCC Stage Group  AJCC Staging: T: 3 N: 2 M: 1 Group: Stage IV Extensive stage NET of Lung   National site-specific guidelines NCCN were discussed with respect to the case.  Tumor board is a meeting of clinicians from various specialty areas who evaluate and discuss patients for whom a multidisciplinary approach is being considered. Final determinations in the plan of care are those of the provider(s). The responsibility for follow up of recommendations given during tumor board is that of the provider.   Today's extended care, comprehensive team conference, David Johnson was not present for the discussion and was not examined.   Multidisciplinary Tumor Board is a multidisciplinary case peer review process.  Decisions discussed in the Multidisciplinary Tumor Board reflect the opinions of the specialists present at the conference without having examined the patient.  Ultimately, treatment and diagnostic decisions rest with the primary provider(s) and the  patient.

## 2020-04-21 ENCOUNTER — Ambulatory Visit
Admission: RE | Admit: 2020-04-21 | Discharge: 2020-04-21 | Disposition: A | Discharge status: Home / Self Care | Payer: Managed Care, Other (non HMO) | Source: Ambulatory Visit | Attending: Oncology | Admitting: Oncology

## 2020-04-21 ENCOUNTER — Other Ambulatory Visit: Payer: Self-pay | Admitting: Nurse Practitioner

## 2020-04-21 ENCOUNTER — Inpatient Hospital Stay (HOSPITAL_BASED_OUTPATIENT_CLINIC_OR_DEPARTMENT_OTHER): Payer: Managed Care, Other (non HMO) | Admitting: Nurse Practitioner

## 2020-04-21 ENCOUNTER — Ambulatory Visit
Admission: RE | Admit: 2020-04-21 | Discharge: 2020-04-21 | Disposition: A | Discharge status: Home / Self Care | Payer: Managed Care, Other (non HMO) | Source: Ambulatory Visit | Attending: Radiology | Admitting: Radiology

## 2020-04-21 ENCOUNTER — Ambulatory Visit
Admission: RE | Admit: 2020-04-21 | Discharge: 2020-04-21 | Disposition: A | Discharge status: Home / Self Care | Payer: Managed Care, Other (non HMO) | Source: Ambulatory Visit | Attending: Nurse Practitioner | Admitting: Nurse Practitioner

## 2020-04-21 ENCOUNTER — Other Ambulatory Visit: Payer: Self-pay

## 2020-04-21 ENCOUNTER — Other Ambulatory Visit: Payer: Self-pay | Admitting: Radiology

## 2020-04-21 VITALS — BP 109/66 | HR 110 | Resp 20

## 2020-04-21 DIAGNOSIS — F17210 Nicotine dependence, cigarettes, uncomplicated: Secondary | ICD-10-CM | POA: Insufficient documentation

## 2020-04-21 DIAGNOSIS — C3491 Malignant neoplasm of unspecified part of right bronchus or lung: Secondary | ICD-10-CM

## 2020-04-21 DIAGNOSIS — J90 Pleural effusion, not elsewhere classified: Secondary | ICD-10-CM | POA: Insufficient documentation

## 2020-04-21 DIAGNOSIS — R0602 Shortness of breath: Secondary | ICD-10-CM

## 2020-04-21 DIAGNOSIS — Z9889 Other specified postprocedural states: Secondary | ICD-10-CM

## 2020-04-21 DIAGNOSIS — C3411 Malignant neoplasm of upper lobe, right bronchus or lung: Secondary | ICD-10-CM

## 2020-04-21 HISTORY — PX: IR IMAGING GUIDED PORT INSERTION: IMG5740

## 2020-04-21 LAB — SARS CORONAVIRUS 2 BY RT PCR (HOSPITAL ORDER, PERFORMED IN ~~LOC~~ HOSPITAL LAB): SARS Coronavirus 2: NEGATIVE

## 2020-04-21 MED ORDER — MIDAZOLAM HCL 5 MG/5ML IJ SOLN
INTRAMUSCULAR | Status: AC
  Filled 2020-04-21: qty 5

## 2020-04-21 MED ORDER — SODIUM CHLORIDE 0.9 % IV SOLN: INTRAVENOUS | Status: DC

## 2020-04-21 MED ORDER — FENTANYL CITRATE (PF) 100 MCG/2ML IJ SOLN
INTRAMUSCULAR | Status: AC | PRN
  Administered 2020-04-21: 50 ug via INTRAVENOUS

## 2020-04-21 MED ORDER — LORAZEPAM 2 MG/ML IJ SOLN
INTRAMUSCULAR | Status: AC | PRN
  Administered 2020-04-21: 1 mg via INTRAVENOUS

## 2020-04-21 MED ORDER — FENTANYL CITRATE (PF) 100 MCG/2ML IJ SOLN
INTRAMUSCULAR | Status: AC
  Filled 2020-04-21: qty 2

## 2020-04-21 MED ORDER — HEPARIN SOD (PORK) LOCK FLUSH 100 UNIT/ML IV SOLN
INTRAVENOUS | Status: AC
  Filled 2020-04-21: qty 5

## 2020-04-21 NOTE — Progress Notes (Signed)
Symptom Management [place]  Telephone:([phone] [phone]  Patient Care Team: [place] Pink, MD as PCP - General (Family Medicine)   Name of the patient: David Johnson  [id number]  05-12-1945   Date of visit: 04/21/20  Diagnosis- Small Cell Lung cancer  Chief complaint/ Reason for visit- Shortness of breath  Heme/Onc history:  Oncology History  Small cell lung cancer, right ([place])  04/18/2020 Initial Diagnosis   Small cell lung cancer, right ([place])   04/18/2020 Cancer Staging   Staging form: Lung, AJCC 8th Edition - Clinical: Stage IVB (cT3, cN2, pM1c) - Signed by Sindy Guadeloupe, MD on 04/18/2020   04/24/2020 -  Chemotherapy    Patient is on Treatment Plan: LUNG SCLC CARBOPLATIN + ETOPOSIDE + ATEZOLIZUMAB INDUCTION Q21D / ATEZOLIZUMAB MAINTENANCE Q21D        Interval history-patient is 75 year old male diagnosed with small cell lung cancer presents to symptom management clinic after complaining of worsening shortness of breath while having a port placed for administration of chemotherapy earlier today.  He had a chest x-ray performed and presents for discussion of results and management of symptoms. Shortness of breath has been gradually worsening. No chest pain.   Review of systems- Review of Systems  Constitutional: Positive for malaise/fatigue and weight loss. Negative for chills and fever.  HENT: Negative for hearing loss, nosebleeds, sore throat and tinnitus.   Eyes: Negative for blurred vision and double vision.  Respiratory: Positive for cough and shortness of breath. Negative for hemoptysis and wheezing.   Cardiovascular: Negative for chest pain, palpitations and leg swelling.  Gastrointestinal: Negative for abdominal pain, blood in stool, constipation, diarrhea, melena, nausea and vomiting.  Genitourinary: Negative for dysuria and urgency.  Musculoskeletal: Negative for back pain, falls, joint pain and myalgias.  Skin:  Negative for itching and rash.  Neurological: Negative for dizziness, tingling, sensory change, loss of consciousness, weakness and headaches.  Endo/Heme/Allergies: Negative for environmental allergies. Does not bruise/bleed easily.  Psychiatric/Behavioral: Negative for depression. The patient is not nervous/anxious and does not have insomnia.      Allergies  Allergen Reactions  . Cephalexin Rash    Past Medical History:  Diagnosis Date  . Adenomatous polyps   . Barrett's esophagus   . Colon polyp   . COPD (chronic obstructive pulmonary disease) ([place])    uses inhaler, "i may have COPD"  . Dyspnea   . Eye injury   . GERD (gastroesophageal reflux disease)   . History of gonorrhea   . History of pneumonia   . Obesity   . Pneumonia   . Reflux esophagitis   . Sleep apnea     Past Surgical History:  Procedure Laterality Date  . CATARACT EXTRACTION Left   . COLONOSCOPY    . COLONOSCOPY WITH PROPOFOL N/A 05/19/2015   Procedure: COLONOSCOPY WITH PROPOFOL;  Surgeon: [name], MD;  Location[facility] ENDOSCOPY;  Service: Endoscopy;  Laterality: N/A;  . COLONOSCOPY WITH PROPOFOL N/A 09/11/2018   Procedure: COLONOSCOPY WITH PROPOFOL;  Surgeon: [name], MD;  Location: [facility] ENDOSCOPY;  Service: Endoscopy;  Laterality: N/A;  . ESOPHAGOGASTRODUODENOSCOPY (EGD) WITH PROPOFOL N/A 05/19/2015   Procedure: ESOPHAGOGASTRODUODENOSCOPY (EGD) WITH PROPOFOL;  Surgeon: [name], MD;  Location: [facility] ENDOSCOPY;  Service: Endoscopy;  Laterality: N/A;  . ESOPHAGOGASTRODUODENOSCOPY (EGD) WITH PROPOFOL N/A 10/11/2016   Procedure: ESOPHAGOGASTRODUODENOSCOPY (EGD) WITH PROPOFOL;  Surgeon: [name], MD;  Location: [facility] ENDOSCOPY;  Service: Endoscopy;  Laterality: N/A;  . ESOPHAGOGASTRODUODENOSCOPY (EGD)  WITH PROPOFOL N/A 01/01/2018   Procedure: ESOPHAGOGASTRODUODENOSCOPY (EGD) WITH PROPOFOL;  Surgeon: [name], MD;  Location: [facility] ENDOSCOPY;  Service: Endoscopy;   Laterality: N/A;  . ESOPHAGOGASTRODUODENOSCOPY (EGD) WITH PROPOFOL N/A 09/11/2018   Procedure: ESOPHAGOGASTRODUODENOSCOPY (EGD) WITH PROPOFOL;  Surgeon: [name], MD;  Location: [facility] ENDOSCOPY;  Service: Endoscopy;  Laterality: N/A;  . EYE SURGERY      Social History   Socioeconomic History  . Marital status: Married    Spouse name: Not on file  . Number of children: Not on file  . Years of education: Not on file  . Highest education level: Not on file  Occupational History  . Not on file  Tobacco Use  . Smoking status: Current Every Day Smoker    Packs/day: 1.50    Years: 35.00    Pack years: 52.50    Types: Cigarettes, Cigars    Last attempt to quit: 03/04/2015    Years since quitting: 5.1  . Smokeless tobacco: Never Used  Vaping Use  . Vaping Use: Every day  . Substances: Flavoring  Substance and Sexual Activity  . Alcohol use: Yes    Alcohol/week: 8.0 standard drinks    Types: 8 Cans of beer per week    Comment: Occassionally on weekends   . Drug use: No  . Sexual activity: Not on file  Other Topics Concern  . Not on file  Social History Narrative  . Not on file   Social Determinants of Health   Financial Resource Strain: Not on file  Food Insecurity: Not on file  Transportation Needs: Not on file  Physical Activity: Not on file  Stress: Not on file  Social Connections: Not on file  Intimate Partner Violence: Not on file    Family History  Problem Relation Age of Onset  . Emphysema Mother      Current Outpatient Medications:  .  albuterol (PROVENTIL HFA;VENTOLIN HFA) 108 (90 Base) MCG/ACT inhaler, 2 puffs q.i.d. p.r.n. short of breath, wheezing, or cough, Disp: , Rfl:  .  lidocaine-prilocaine (EMLA) cream, Apply to affected area once, Disp: 30 g, Rfl: 3 .  LORazepam (ATIVAN) 0.5 MG tablet, Take 1 tablet (0.5 mg total) by mouth 3 (three) times daily as needed (Nausea or vomiting)., Disp: 30 tablet, Rfl: 0 .  ondansetron (ZOFRAN) 8 MG tablet, Take  1 tablet (8 mg total) by mouth 2 (two) times daily as needed for refractory nausea / vomiting. Start on day 3 after carboplatin chemo., Disp: 30 tablet, Rfl: 1 .  prochlorperazine (COMPAZINE) 10 MG tablet, Take 1 tablet (10 mg total) by mouth every 6 (six) hours as needed (Nausea or vomiting)., Disp: 30 tablet, Rfl: 1 .  neomycin-polymyxin-dexameth (MAXITROL) 0.1 % OINT, Administer 1 application into the left eye 4 (four) times a day as needed., Disp: , Rfl:  No current facility-administered medications for this visit.  Facility-Administered Medications Ordered in Other Visits:  .  0.9 %  sodium chloride infusion, , Intravenous, Continuous, Monia Sabal, PA-C .  fentaNYL (SUBLIMAZE) 100 MCG/2ML injection, , , ,  .  heparin lock flush 100 UNIT/ML injection, , , ,  .  midazolam (VERSED) 5 MG/5ML injection, , , ,   Physical exam:  Vitals:   04/21/20 1156  BP: 109/66  Pulse: (!) 110  Resp: 20  SpO2: 94%   Physical Exam Constitutional:      General: He is not in acute distress. HENT:     Head: Normocephalic.  Mouth/Throat:     Pharynx: Oropharynx is clear.  Cardiovascular:     Rate and Rhythm: Normal rate and regular rhythm.  Pulmonary:     Effort: Pulmonary effort is normal. No respiratory distress.     Breath sounds: Decreased breath sounds present. No wheezing or rhonchi.  Musculoskeletal:     Right lower leg: Edema present.     Left lower leg: Edema present.  Skin:    General: Skin is warm and dry.  Neurological:     Mental Status: He is alert and oriented to person, place, and time.  Psychiatric:        Mood and Affect: Mood normal.        Behavior: Behavior normal.      CMP Latest Ref Rng & Units 04/01/2020  Glucose 70 - 99 mg/dL 125(H)  BUN 8 - 23 mg/dL 22  Creatinine 0.61 - 1.24 mg/dL 1.04  Sodium 135 - 145 mmol/L 139  Potassium 3.5 - 5.1 mmol/L 4.7  Chloride 98 - 111 mmol/L 105  CO2 22 - 32 mmol/L 24  Calcium 8.9 - 10.3 mg/dL 9.3  Total Protein 6.5 - 8.1  g/dL -  Total Bilirubin 0.3 - 1.2 mg/dL -  Alkaline Phos 38 - 126 U/L -  AST 15 - 41 U/L -  ALT 0 - 44 U/L -   CBC Latest Ref Rng & Units 04/01/2020  WBC 4.0 - 10.5 K/uL 20.4(H)  Hemoglobin 13.0 - 17.0 g/dL 18.8(H)  Hematocrit 39.0 - 52.0 % 55.6(H)  Platelets 150 - 400 K/uL 219    No images are attached to the encounter.  DG Chest 1 View  Result Date: 04/21/2020 CLINICAL DATA:  Shortness of breath, pleural effusion. EXAM: CHEST  1 VIEW COMPARISON:  Chest radiograph done earlier the same day and CT chest 03/30/2020. FINDINGS: Single lateral view of the chest shows a large pleural effusion, shown to be on the right on the frontal view performed earlier the same day. Associated collapse/consolidation in the adjacent right lung. No left pleural effusion. IMPRESSION: Large right pleural effusion with associated collapse/consolidation in the adjacent lung. Electronically Signed   By: Lorin Picket M.D.   On: 04/21/2020 11:41   DG Chest 2 View  Result Date: 03/30/2020 CLINICAL DATA:  Pain and stiffness EXAM: CHEST - 2 VIEW COMPARISON:  June 03, 2016 FINDINGS: Cardiomediastinal silhouette is enlarged in contour. There is a rounded nodular opacity projecting along the RIGHT superior hilar border. Possible thickening of the RIGHT paratracheal stripe. There is a moderate RIGHT pleural effusion. No significant LEFT pleural effusion. Homogeneous opacification of the RIGHT lower lung. Mild diffuse interstitial prominence. Visualized abdomen is unremarkable. Mild degenerative changes of the thoracic spine. IMPRESSION: 1. Rounded nodular opacity projecting along the RIGHT superior hilar border with possible thickening of the RIGHT paratracheal stripe. Consider further evaluation with dedicated cross-sectional imaging. 2. Moderate RIGHT pleural effusion. 3. Mild pulmonary edema. Homogeneous opacification of the RIGHT lower lung, most likely atelectasis. Electronically Signed   By: Valentino Saxon MD   On:  03/30/2020 10:28   MR BRAIN W WO CONTRAST  Result Date: 04/01/2020 CLINICAL DATA:  Non-small cell lung cancer staging. EXAM: MRI HEAD WITHOUT AND WITH CONTRAST TECHNIQUE: Multiplanar, multiecho pulse sequences of the brain and surrounding structures were obtained without and with intravenous contrast. CONTRAST:  67mL GADAVIST GADOBUTROL 1 MMOL/ML IV SOLN COMPARISON:  None. FINDINGS: Brain: No acute infarction, hemorrhage, hydrocephalus, extra-axial collection or mass lesion. Diffuse cerebral atrophy with prominence of the  extra-axial spaces along the frontal convexity. No abnormal enhancement. Vascular: Major arterial flow voids are maintained at the skull base. Skull and upper cervical spine: Normal marrow signal. Sinuses/Orbits: Sinuses are largely clear. Other: Nonspecific 1 cm mildly T2 hyperintense, T1 isointense lesion within the left temporal bone (series 12, image 65). IMPRESSION: 1. No evidence of acute abnormality or intraparenchymal metastatic disease. 2. Nonspecific 1 cm osseous lesion within the left temporal bone. This could represent a benign process such as fibrous dysplasia, but is nonspecific by MRI. If there is no diagnostic prior outside imaging, recommend CT of the temporal bone with contrast to further evaluate and exclude osseous metastatic disease. Electronically Signed   By: Margaretha Sheffield MD   On: 04/01/2020 10:04   NM PET Image Initial (PI) Skull Base To Thigh  Result Date: 04/11/2020 CLINICAL DATA:  Initial treatment strategy for right middle lobe lung nodule and mediastinal adenopathy. Abnormal opacity in the right upper lobe, possible mass. EXAM: NUCLEAR MEDICINE PET SKULL BASE TO THIGH TECHNIQUE: 16.1 mCi F-18 FDG was injected intravenously. Full-ring PET imaging was performed from the skull base to thigh after the radiotracer. CT data was obtained and used for attenuation correction and anatomic localization. Fasting blood glucose: One hundred twenty-eight mg/dl COMPARISON:   Chest CT 03/30/2020 FINDINGS: Mediastinal blood pool activity: SUV max 3.0 Liver activity: SUV max NA NECK: Diffuse thyroid activity likely from thyroiditis. Mildly prominent right thyroid lobe. Maximum SUV of the right thyroid lobe is 10.2 and the left thyroid lobe 7.3. No definite hypermetabolic activity in the vicinity of the small left temporal bone lesion shown on recent MRI. Incidental CT findings: None CHEST: Abnormal bandlike lesion in the right upper lobe measuring about 6.8 by 2.4 cm, maximum SUV 10.9, high suspicion for malignancy. Large right pleural effusion with hypermetabolic rind of pleural tissue measuring up to about 1.4 cm in thickness on image 93 series 3, maximum SUV of this rind 13.8, compatible with malignant pleural effusion. Conglomerate right paratracheal adenopathy measuring about 3.8 by 5.9 cm on image 106 series 3, maximum SUV 17.1, compatible with malignancy. A right hilar node measuring 1.5 cm in short axis on image 119 of series 3 has a maximum SUV of 10.0, compatible with malignancy. Hypermetabolic lymph nodes are present in the right pericardial space. One of several hypermetabolic lymph nodes measures 1.4 cm in short axis on image 147 of series 3 with maximum SUV 9.0 1.3 by 1.0 cm right middle lobe nodule with adjacent mild nodularity noted just below the minor fissure, maximum SUV 3.7, compatible with malignancy. Hypermetabolic activity along the collapsed right lower lobe could be within the lower lobe for along the visceral pleural margin, measuring about 1.2 by 1.8 cm on image 149 series 3 with maximum SUV 8.3. Incidental CT findings: Large malignant right pleural effusion. Mild cardiomegaly. Mild atherosclerotic calcification of the aortic arch. ABDOMEN/PELVIS: I do not see a discrete adrenal mass but there is some faintly accentuated activity along the left adrenal gland with maximum SUV 7.0. Speckled or noisy PET signal in the abdomen. Incidental CT findings: Cholelithiasis  mild abdominal aortic atherosclerosis. SKELETON: There are few scattered skeletal metastatic lesions. This includes a lytic lesion of the sternal manubrium measuring approximately 2.9 by 1.8 cm on image 91 of series 3, maximum SUV 7.6. 1.4 by 1.1 cm lytic lesion left iliac crest on image 237 series 3, maximum SUV 8.1. A small hypermetabolic lesion in the right femoral neck has a maximum SUV of 9.7 but  is poorly delineated on the CT data. Speckled/noisy signal along the spine without well-defined metastatic involvement of the spine. Activity along the left proximal humerus is thought to probably be in the joint rather than in the humeral head, and accordingly probably physiologic. There is a small focus of hypermetabolic activity in the right posterior iliac bone without CT correlate, maximum SUV 7.3. There is a hypermetabolic lesion in the left mid humeral shaft centrally, maximum SUV 6.5. Incidental CT findings: Thoracic spondylosis. IMPRESSION: 1. Hypermetabolic right upper lobe mass with a few scattered osseous metastatic lesions including the sternal manubrium, bony pelvis, left mid humerus, and right proximal femur. Large conglomerate right paratracheal adenopathy with hypermetabolic right hilar adenopathy, a large malignant right pleural effusion with hypermetabolic pleural rind, and hypermetabolic pericardial lymph nodes. 2. The 1.3 by 1.0 cm right middle lobe pulmonary nodule is weakly hypermetabolic and probably a metastatic lesion. 3. Faintly accentuated activity in the left adrenal gland without a well-defined mass, indeterminate. 4. 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 Somewhat prominent right thyroid lobe compared to the left. 5. Other imaging findings of potential clinical significance: Aortic Atherosclerosis (ICD10-I70.0). Mild cardiomegaly. Cholelithiasis. Electronically Signed   By: Van Clines M.D.   On: 04/11/2020 14:13   US Venous Img Lower Bilateral  Result Date:  03/30/2020 CLINICAL DATA:  Shortness of breath, elevated D-dimer level. EXAM: BILATERAL LOWER EXTREMITY VENOUS DOPPLER ULTRASOUND TECHNIQUE: Gray-scale sonography with graded compression, as well as color Doppler and duplex ultrasound were performed to evaluate the lower extremity deep venous systems from the level of the common femoral vein and including the common femoral, femoral, profunda femoral, popliteal and calf veins including the posterior tibial, peroneal and gastrocnemius veins when visible. The superficial great saphenous vein was also interrogated. Spectral Doppler was utilized to evaluate flow at rest and with distal augmentation maneuvers in the common femoral, femoral and popliteal veins. COMPARISON:  November 26, 2013. FINDINGS: RIGHT LOWER EXTREMITY Common Femoral Vein: No evidence of thrombus. Normal compressibility, respiratory phasicity and response to augmentation. Saphenofemoral Junction: No evidence of thrombus. Normal compressibility and flow on color Doppler imaging. Profunda Femoral Vein: No evidence of thrombus. Normal compressibility and flow on color Doppler imaging. Femoral Vein: No evidence of thrombus. Normal compressibility, respiratory phasicity and response to augmentation. Popliteal Vein: No evidence of thrombus. Normal compressibility, respiratory phasicity and response to augmentation. Calf Veins: No evidence of thrombus. Normal compressibility and flow on color Doppler imaging. Superficial Great Saphenous Vein: No evidence of thrombus. Normal compressibility. Venous Reflux:  None. Other Findings:  None. LEFT LOWER EXTREMITY Common Femoral Vein: No evidence of thrombus. Normal compressibility, respiratory phasicity and response to augmentation. Saphenofemoral Junction: No evidence of thrombus. Normal compressibility and flow on color Doppler imaging. Profunda Femoral Vein: No evidence of thrombus. Normal compressibility and flow on color Doppler imaging. Femoral Vein: No evidence  of thrombus. Normal compressibility, respiratory phasicity and response to augmentation. Popliteal Vein: No evidence of thrombus. Normal compressibility, respiratory phasicity and response to augmentation. Calf Veins: No evidence of thrombus. Normal compressibility and flow on color Doppler imaging. Superficial Great Saphenous Vein: No evidence of thrombus. Normal compressibility. Venous Reflux:  None. Other Findings:  None. IMPRESSION: No evidence of deep venous thrombosis in either lower extremity. Electronically Signed   By: Marijo Conception M.D.   On: 03/30/2020 15:40   CT BIOPSY  Result Date: 04/14/2020 INDICATION: Imaging findings concerning for lung cancer with pleural involvement EXAM: CT-GUIDED BIOPSY RIGHT PLEURAL NODULAR MASS MEDICATIONS: 1% LIDOCAINE LOCAL  ANESTHESIA/SEDATION: 0.5 mg IV Versed; 50 mcg IV Fentanyl Moderate Sedation Time:  10 MINUTES The patient was continuously monitored during the procedure by the interventional radiology nurse under my direct supervision. PROCEDURE: The procedure, risks, benefits, and alternatives were explained to the patient. Questions regarding the procedure were encouraged and answered. The patient understands and consents to the procedure. Previous imaging reviewed. Patient position prone. Noncontrast localization CT performed. The posteromedial pleural nodular mass was localized and marked for a lateral posterior approach. Under sterile conditions and local anesthesia, CT guidance utilized to advance a 17 gauge 6.8 cm access needle to the right pleural nodular mass. Needle position confirmed with CT. 18 gauge core biopsies obtained. Samples were intact and non fragmented. These were placed in formalin. Postprocedure imaging demonstrates no pneumothorax or pulmonary hemorrhage. Patient tolerated the procedure well without complication. Vital sign monitoring by nursing staff during the procedure will continue as patient is in the special procedures unit for post  procedure observation. FINDINGS: The images document guide needle placement within the right posterior pleural nodule or mass. Post biopsy images demonstrate no pneumothorax or pulmonary hemorrhage. COMPLICATIONS: None immediate. IMPRESSION: Successful CT-guided core biopsy of the right posterior pleural nodular mass Electronically Signed   By: Jerilynn Mages.  Shick M.D.   On: 04/14/2020 13:48   DG Chest Port 1 View  Result Date: 04/21/2020 CLINICAL DATA:  Worsening shortness of breath. EXAM: PORTABLE CHEST 1 VIEW COMPARISON:  04/14/2020 FINDINGS: Two AP portable radiographs. Right Port-A-Cath tip at superior caval/atrial junction. Midline trachea. Mild cardiomegaly. Significant enlargement of moderate right pleural effusion. No left-sided pleural fluid or pneumothorax. The right hilar mass and paratracheal adenopathy are partially obscured by progressive right mid and lower lung airspace disease. No overt congestive failure. Left lung is clear. IMPRESSION: Increased moderate right pleural effusion with worsening right-sided aeration, most likely due to atelectasis. Right hilar mass and paratracheal adenopathy are partially obscured by progressive right-sided airspace disease. Electronically Signed   By: Abigail Miyamoto M.D.   On: 04/21/2020 10:57   DG Chest Port 1 View  Result Date: 04/14/2020 CLINICAL DATA:  Suspect metastatic lung cancer, right effusion, status post thoracentesis EXAM: PORTABLE CHEST 1 VIEW COMPARISON:  04/11/2020 FINDINGS: Improvement in the right effusion following thoracentesis. No pneumothorax. Small residual right effusion remains. Stable right hilar mass and paratracheal adenopathy. Stable left lung aeration. Trachea midline. Heart is enlarged. IMPRESSION: Negative for pneumothorax following right thoracentesis. Small right effusion remains. Known right hilar mass and right paratracheal adenopathy consistent with bronchogenic carcinoma. Electronically Signed   By: Jerilynn Mages.  Shick M.D.   On: 04/14/2020  11:20   DG Chest Port 1 View  Result Date: 03/31/2020 CLINICAL DATA:  Right pleural effusion, status post thoracentesis EXAM: PORTABLE CHEST 1 VIEW COMPARISON:  03/30/2020 FINDINGS: Interval decrease in volume of a moderate right pleural effusion, now small, with associated atelectasis or consolidation. Redemonstrated mass of the suprahilar right lung. Cardiomegaly. IMPRESSION: 1. Interval decrease in volume of a moderate right pleural effusion, now small, with associated atelectasis or consolidation. No pneumothorax. 2. Redemonstrated mass of the suprahilar right lung. Electronically Signed   By: Eddie Candle M.D.   On: 03/31/2020 13:37   CT Angio Chest/Abd/Pel for Dissection W and/or Wo Contrast  Result Date: 03/30/2020 CLINICAL DATA:  Chest and back pain. EXAM: CT ANGIOGRAPHY CHEST, ABDOMEN AND PELVIS TECHNIQUE: Non-contrast CT of the chest was initially obtained. Multidetector CT imaging through the chest, abdomen and pelvis was performed using the standard protocol during bolus administration of  intravenous contrast. Multiplanar reconstructed images and MIPs were obtained and reviewed to evaluate the vascular anatomy. CONTRAST:  152mL OMNIPAQUE IOHEXOL 350 MG/ML SOLN COMPARISON:  None. FINDINGS: CTA CHEST FINDINGS Cardiovascular: Preferential opacification of the thoracic aorta. No evidence of thoracic aortic aneurysm or dissection. Normal heart size. No pericardial effusion. Mediastinum/Nodes: The esophagus is unremarkable. 6.4 x 3.7 cm right paratracheal adenopathy is noted concerning for malignancy or metastatic disease. 1.9 cm right hilar lymph node is noted. 12 mm anterior mediastinal lymph node is noted concerning for metastatic disease. 2.8 cm right thyroid nodule is noted. Lungs/Pleura: No pneumothorax is noted. Left lung is clear. Moderate size right pleural effusion is noted with associated atelectasis of the right lower lobe. Several pleural-based masses are noted posteriorly concerning for  metastatic disease. Large irregular opacity is noted posteriorly in the right upper lobe which may represent malignancy or associated postobstructive atelectasis. 12 x 9 mm nodule is noted in right middle lobe concerning for metastatic disease. Musculoskeletal: Lytic lesion is noted in the manubrium consistent with metastatic disease. Review of the MIP images confirms the above findings. CTA ABDOMEN AND PELVIS FINDINGS VASCULAR Aorta: Normal caliber aorta without aneurysm, dissection, vasculitis or significant stenosis. Celiac: Patent without evidence of aneurysm, dissection, vasculitis or significant stenosis. SMA: Patent without evidence of aneurysm, dissection, vasculitis or significant stenosis. Renals: Both renal arteries are patent without evidence of aneurysm, dissection, vasculitis, fibromuscular dysplasia or significant stenosis. IMA: Patent without evidence of aneurysm, dissection, vasculitis or significant stenosis. Inflow: Patent without evidence of aneurysm, dissection, vasculitis or significant stenosis. Veins: No obvious venous abnormality within the limitations of this arterial phase study. Review of the MIP images confirms the above findings. NON-VASCULAR Hepatobiliary: Cholelithiasis is noted. No biliary dilatation is noted. The liver is unremarkable. Pancreas: Unremarkable. No pancreatic ductal dilatation or surrounding inflammatory changes. Spleen: Normal in size without focal abnormality. Adrenals/Urinary Tract: Adrenal glands are unremarkable. Kidneys are normal, without renal calculi, focal lesion, or hydronephrosis. Bladder is unremarkable. Stomach/Bowel: Stomach is within normal limits. Appendix appears normal. No evidence of bowel wall thickening, distention, or inflammatory changes. Lymphatic: No significant adenopathy is noted in the abdomen or pelvis. Reproductive: Mild prostatic enlargement is noted. Other: No abdominal wall hernia or abnormality. No abdominopelvic ascites.  Musculoskeletal: No acute or significant osseous findings. Review of the MIP images confirms the above findings. IMPRESSION: 1. There is no evidence of thoracic or abdominal aortic dissection or aneurysm. 2. Mediastinal adenopathy is noted concerning for malignancy or metastatic disease. 3. Moderate size right pleural effusion is noted with associated atelectasis of the right lower lobe. There appears to be several pleural based masses in the right upper lung zone concerning for metastatic disease. 4. Large irregular opacity is noted posteriorly in the right upper lobe which may represent malignancy or associated postobstructive atelectasis. 5. 12 x 9 mm nodule is noted in right middle lobe concerning for metastatic disease. 6. Lytic lesion is noted in the manubrium consistent with metastatic disease. 7. Cholelithiasis. 8. Mild prostatic enlargement. 9. 2.8 cm right thyroid nodule is noted. In the setting of significant comorbidities or limited life expectancy, no follow-up recommended. (Ref: J Am Coll Radiol. 2015 Feb;12(2): 143-50). Electronically Signed   By: Marijo Conception M.D.   On: 03/30/2020 14:10   US THORACENTESIS ASP PLEURAL SPACE W/IMG GUIDE  Result Date: 04/14/2020 INDICATION: Patient with suspected right lung cancer and recurrent right pleural effusions presents today for therapeutic thoracentesis prior to CT biopsy procedure. EXAM: ULTRASOUND GUIDED THORACENTESIS MEDICATIONS: 1%  lidocaine 10 COMPLICATIONS: None immediate. PROCEDURE: An ultrasound guided thoracentesis was thoroughly discussed with the patient and questions answered. The benefits, risks, alternatives and complications were also discussed. The patient understands and wishes to proceed with the procedure. Written consent was obtained. Ultrasound was performed to localize and mark an adequate pocket of fluid in the right chest. The area was then prepped and draped in the normal sterile fashion. 1% Lidocaine was used for local  anesthesia. Under ultrasound guidance a 6 Fr Safe-T-Centesis catheter was introduced. Thoracentesis was performed. The catheter was removed and a dressing applied. FINDINGS: A total of approximately 1.6 L of amber-colored fluid was removed. Samples were sent to the laboratory as requested by the clinical team. IMPRESSION: Successful ultrasound guided right thoracentesis yielding 1.6 L of pleural fluid. Read by: Soyla Dryer, NP Electronically Signed   By: Jerilynn Mages.  Shick M.D.   On: 04/14/2020 11:21   US THORACENTESIS ASP PLEURAL SPACE W/IMG GUIDE  Result Date: 03/31/2020 INDICATION: Patient with newly discovered pleural-based masses and right pleural effusion presents today for therapeutic and diagnostic thoracentesis. EXAM: ULTRASOUND GUIDED THORACENTESIS MEDICATIONS: 1% lidocaine 10 mL COMPLICATIONS: None immediate. PROCEDURE: An ultrasound guided thoracentesis was thoroughly discussed with the patient and questions answered. The benefits, risks, alternatives and complications were also discussed. The patient understands and wishes to proceed with the procedure. Written consent was obtained. Ultrasound was performed to localize and mark an adequate pocket of fluid in the right chest. The area was then prepped and draped in the normal sterile fashion. 1% Lidocaine was used for local anesthesia. Under ultrasound guidance a 6 Fr Safe-T-Centesis catheter was introduced. Thoracentesis was performed. The catheter was removed and a dressing applied. FINDINGS: A total of approximately 1.3 L of amber-colored fluid was removed. Samples were sent to the laboratory as requested by the clinical team. IMPRESSION: Successful ultrasound guided right thoracentesis yielding 1.3 L of pleural fluid. Read by: Soyla Dryer, NP Electronically Signed   By: Miachel Roux M.D.   On: 03/31/2020 14:01    Assessment and plan- Patient is a 75 y.o. 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 Xray consistent with pleural effusion s/p thoracentesis. Persistent shortness of breath. Likely secondary to disease. Desaturation slightly with ambulation. Qualifies for home oxygen. Will send order. Will sent order for shower chair. Start morphine for dyspnea. Discussed that hopefully starting chemotherapy will help the fluid in his lungs to reaccumulate less frequently. If symptoms persist or recur, return [facility]. Otherwise follow up with Dr. Janese Banks as scheduled for initiation of chemotherapy.    Visit Diagnosis 1. Small cell lung cancer, right ([place])   2. Pleural effusion     Patient expressed understanding and was in agreement with this plan. He also understands that He can call clinic at any time with any questions, concerns, or complaints.   Thank you for allowing me to participate in the care of this very pleasant patient.   Beckey Rutter, DNP, AGNP-C [facility] [place]

## 2020-04-21 NOTE — Procedures (Signed)
PROCEDURE SUMMARY:  Successful US guided right thoracentesis. Yielded 1.7 L of blood tinged fluid. Pt tolerated procedure well. No immediate complications.  Specimen was not sent for labs. CXR ordered.  EBL < 5 mL  Ascencion Dike PA-C 04/21/2020 4:35 PM

## 2020-04-21 NOTE — H&P (Signed)
Chief Complaint: Patient was seen in consultation today for port placement  Referring Physician(s): Rao,Archana C  Patient Status: [facility] - Out-pt  History of Present Illness: David Johnson is a 75 y.o. 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  He is mildly short of breath but otherwise feels well today denying fevers, chills, chest pain, abdominal pain, nausea, vomiting.  Past Medical History:  Diagnosis Date  . Adenomatous polyps   . Barrett's esophagus   . Colon polyp   . COPD (chronic obstructive pulmonary disease) ([place])    uses inhaler, "i may have COPD"  . Dyspnea   . Eye injury   . GERD (gastroesophageal reflux disease)   . History of gonorrhea   . History of pneumonia   . Obesity   . Pneumonia   . Reflux esophagitis   . Sleep apnea     Past Surgical History:  Procedure Laterality Date  . CATARACT EXTRACTION Left   . COLONOSCOPY    . COLONOSCOPY WITH PROPOFOL N/A 05/19/2015   Procedure: COLONOSCOPY WITH PROPOFOL;  Surgeon: [name], MD;  Location: [facility] ENDOSCOPY;  Service: Endoscopy;  Laterality: N/A;  . COLONOSCOPY WITH PROPOFOL N/A 09/11/2018   Procedure: COLONOSCOPY WITH PROPOFOL;  Surgeon: [name], MD;  Location: [facility] ENDOSCOPY;  Service: Endoscopy;  Laterality: N/A;  . ESOPHAGOGASTRODUODENOSCOPY (EGD) WITH PROPOFOL N/A 05/19/2015   Procedure: ESOPHAGOGASTRODUODENOSCOPY (EGD) WITH PROPOFOL;  Surgeon: [name], MD;  Location: [facility] ENDOSCOPY;  Service: Endoscopy;  Laterality: N/A;  . ESOPHAGOGASTRODUODENOSCOPY (EGD) WITH PROPOFOL N/A 10/11/2016   Procedure: ESOPHAGOGASTRODUODENOSCOPY (EGD) WITH PROPOFOL;  Surgeon: [name], MD;  Location: [facility] ENDOSCOPY;  Service: Endoscopy;  Laterality: N/A;  . ESOPHAGOGASTRODUODENOSCOPY (EGD) WITH PROPOFOL N/A 01/01/2018   Procedure: ESOPHAGOGASTRODUODENOSCOPY (EGD) WITH PROPOFOL;  Surgeon: [name],  MD;  Location: [facility] ENDOSCOPY;  Service: Endoscopy;  Laterality: N/A;  . ESOPHAGOGASTRODUODENOSCOPY (EGD) WITH PROPOFOL N/A 09/11/2018   Procedure: ESOPHAGOGASTRODUODENOSCOPY (EGD) WITH PROPOFOL;  Surgeon: [name], MD;  Location: [facility] ENDOSCOPY;  Service: Endoscopy;  Laterality: N/A;  . EYE SURGERY      Allergies: Cephalexin  Medications: Prior to Admission medications   Medication Sig Start Date End Date Taking? Authorizing Provider  albuterol (PROVENTIL HFA;VENTOLIN HFA) 108 (90 Base) MCG/ACT inhaler 2 puffs q.i.d. p.r.n. short of breath, wheezing, or cough 07/21/16  Yes [provider]  lidocaine-prilocaine (EMLA) cream Apply to affected area once 04/18/20   Sindy Guadeloupe, MD  LORazepam (ATIVAN) 0.5 MG tablet Take 1 tablet (0.5 mg total) by mouth 3 (three) times daily as needed (Nausea or vomiting). 04/19/20   Sindy Guadeloupe, MD  neomycin-polymyxin-dexameth (MAXITROL) 0.1 % OINT Administer 1 application into the left eye 4 (four) times a day as needed. 11/04/18   [provider]  ondansetron (ZOFRAN) 8 MG tablet Take 1 tablet (8 mg total) by mouth 2 (two) times daily as needed for refractory nausea / vomiting. Start on day 3 after carboplatin chemo. 04/18/20   Sindy Guadeloupe, MD  prochlorperazine (COMPAZINE) 10 MG tablet Take 1 tablet (10 mg total) by mouth every 6 (six) hours as needed (Nausea or vomiting). 04/18/20   Sindy Guadeloupe, MD     Family History  Problem Relation Age of Onset  . Emphysema Mother     Social History   Socioeconomic History  . Marital status: Married    Spouse name: Not on file  .  Number of children: Not on file  . Years of education: Not on file  . Highest education level: Not on file  Occupational History  . Not on file  Tobacco Use  . Smoking status: Current Every Day Smoker    Packs/day: 1.50    Years: 35.00    Pack years: 52.50    Types: Cigarettes, Cigars    Last attempt to quit: 03/04/2015    Years since quitting: 5.1   . Smokeless tobacco: Never Used  Vaping Use  . Vaping Use: Every day  . Substances: Flavoring  Substance and Sexual Activity  . Alcohol use: Yes    Alcohol/week: 8.0 standard drinks    Types: 8 Cans of beer per week    Comment: Occassionally on weekends   . Drug use: No  . Sexual activity: Not on file  Other Topics Concern  . Not on file  Social History Narrative  . Not on file   Social Determinants of Health   Financial Resource Strain: Not on file  Food Insecurity: Not on file  Transportation Needs: Not on file  Physical Activity: Not on file  Stress: Not on file  Social Connections: Not on file    Review of Systems: A 12 point ROS discussed and pertinent positives are indicated in the HPI above.  All other systems are negative.  Vital Signs: BP 117/72 (BP Location: Left Arm)   Pulse 92   Resp (!) 24   Ht 6\' 1"  (1.854 m)   Wt (!) 156.5 kg   SpO2 91%   BMI 45.52 kg/m   Physical Exam Constitutional:      General: He is not in acute distress. HENT:     Head: Normocephalic.     Mouth/Throat:     Mouth: Mucous membranes are moist.  Cardiovascular:     Rate and Rhythm: Normal rate and regular rhythm.  Pulmonary:     Breath sounds: Normal breath sounds.  Abdominal:     General: There is no distension.  Skin:    General: Skin is warm and dry.  Neurological:     Mental Status: He is alert and oriented to person, place, and time.     Imaging: DG Chest 2 View  Result Date: 03/30/2020 CLINICAL DATA:  Pain and stiffness EXAM: CHEST - 2 VIEW COMPARISON:  June 03, 2016 FINDINGS: Cardiomediastinal silhouette is enlarged in contour. There is a rounded nodular opacity projecting along the RIGHT superior hilar border. Possible thickening of the RIGHT paratracheal stripe. There is a moderate RIGHT pleural effusion. No significant LEFT pleural effusion. Homogeneous opacification of the RIGHT lower lung. Mild diffuse interstitial prominence. Visualized abdomen is  unremarkable. Mild degenerative changes of the thoracic spine. IMPRESSION: 1. Rounded nodular opacity projecting along the RIGHT superior hilar border with possible thickening of the RIGHT paratracheal stripe. Consider further evaluation with dedicated cross-sectional imaging. 2. Moderate RIGHT pleural effusion. 3. Mild pulmonary edema. Homogeneous opacification of the RIGHT lower lung, most likely atelectasis. Electronically Signed   By: Valentino Saxon MD   On: 03/30/2020 10:28   MR BRAIN W WO CONTRAST  Result Date: 04/01/2020 CLINICAL DATA:  Non-small cell lung cancer staging. EXAM: MRI HEAD WITHOUT AND WITH CONTRAST TECHNIQUE: Multiplanar, multiecho pulse sequences of the brain and surrounding structures were obtained without and with intravenous contrast. CONTRAST:  47mL GADAVIST GADOBUTROL 1 MMOL/ML IV SOLN COMPARISON:  None. FINDINGS: Brain: No acute infarction, hemorrhage, hydrocephalus, extra-axial collection or mass lesion. Diffuse cerebral atrophy with prominence of  the extra-axial spaces along the frontal convexity. No abnormal enhancement. Vascular: Major arterial flow voids are maintained at the skull base. Skull and upper cervical spine: Normal marrow signal. Sinuses/Orbits: Sinuses are largely clear. Other: Nonspecific 1 cm mildly T2 hyperintense, T1 isointense lesion within the left temporal bone (series 12, image 65). IMPRESSION: 1. No evidence of acute abnormality or intraparenchymal metastatic disease. 2. Nonspecific 1 cm osseous lesion within the left temporal bone. This could represent a benign process such as fibrous dysplasia, but is nonspecific by MRI. If there is no diagnostic prior outside imaging, recommend CT of the temporal bone with contrast to further evaluate and exclude osseous metastatic disease. Electronically Signed   By: Margaretha Sheffield MD   On: 04/01/2020 10:04   NM PET Image Initial (PI) Skull Base To Thigh  Result Date: 04/11/2020 CLINICAL DATA:  Initial treatment  strategy for right middle lobe lung nodule and mediastinal adenopathy. Abnormal opacity in the right upper lobe, possible mass. EXAM: NUCLEAR MEDICINE PET SKULL BASE TO THIGH TECHNIQUE: 16.1 mCi F-18 FDG was injected intravenously. Full-ring PET imaging was performed from the skull base to thigh after the radiotracer. CT data was obtained and used for attenuation correction and anatomic localization. Fasting blood glucose: One hundred twenty-eight mg/dl COMPARISON:  Chest CT 03/30/2020 FINDINGS: Mediastinal blood pool activity: SUV max 3.0 Liver activity: SUV max NA NECK: Diffuse thyroid activity likely from thyroiditis. Mildly prominent right thyroid lobe. Maximum SUV of the right thyroid lobe is 10.2 and the left thyroid lobe 7.3. No definite hypermetabolic activity in the vicinity of the small left temporal bone lesion shown on recent MRI. Incidental CT findings: None CHEST: Abnormal bandlike lesion in the right upper lobe measuring about 6.8 by 2.4 cm, maximum SUV 10.9, high suspicion for malignancy. Large right pleural effusion with hypermetabolic rind of pleural tissue measuring up to about 1.4 cm in thickness on image 93 series 3, maximum SUV of this rind 13.8, compatible with malignant pleural effusion. Conglomerate right paratracheal adenopathy measuring about 3.8 by 5.9 cm on image 106 series 3, maximum SUV 17.1, compatible with malignancy. A right hilar node measuring 1.5 cm in short axis on image 119 of series 3 has a maximum SUV of 10.0, compatible with malignancy. Hypermetabolic lymph nodes are present in the right pericardial space. One of several hypermetabolic lymph nodes measures 1.4 cm in short axis on image 147 of series 3 with maximum SUV 9.0 1.3 by 1.0 cm right middle lobe nodule with adjacent mild nodularity noted just below the minor fissure, maximum SUV 3.7, compatible with malignancy. Hypermetabolic activity along the collapsed right lower lobe could be within the lower lobe for along the  visceral pleural margin, measuring about 1.2 by 1.8 cm on image 149 series 3 with maximum SUV 8.3. Incidental CT findings: Large malignant right pleural effusion. Mild cardiomegaly. Mild atherosclerotic calcification of the aortic arch. ABDOMEN/PELVIS: I do not see a discrete adrenal mass but there is some faintly accentuated activity along the left adrenal gland with maximum SUV 7.0. Speckled or noisy PET signal in the abdomen. Incidental CT findings: Cholelithiasis mild abdominal aortic atherosclerosis. SKELETON: There are few scattered skeletal metastatic lesions. This includes a lytic lesion of the sternal manubrium measuring approximately 2.9 by 1.8 cm on image 91 of series 3, maximum SUV 7.6. 1.4 by 1.1 cm lytic lesion left iliac crest on image 237 series 3, maximum SUV 8.1. A small hypermetabolic lesion in the right femoral neck has a maximum SUV of 9.7  but is poorly delineated on the CT data. Speckled/noisy signal along the spine without well-defined metastatic involvement of the spine. Activity along the left proximal humerus is thought to probably be in the joint rather than in the humeral head, and accordingly probably physiologic. There is a small focus of hypermetabolic activity in the right posterior iliac bone without CT correlate, maximum SUV 7.3. There is a hypermetabolic lesion in the left mid humeral shaft centrally, maximum SUV 6.5. Incidental CT findings: Thoracic spondylosis. IMPRESSION: 1. Hypermetabolic right upper lobe mass with a few scattered osseous metastatic lesions including the sternal manubrium, bony pelvis, left mid humerus, and right proximal femur. Large conglomerate right paratracheal adenopathy with hypermetabolic right hilar adenopathy, a large malignant right pleural effusion with hypermetabolic pleural rind, and hypermetabolic pericardial lymph nodes. 2. The 1.3 by 1.0 cm right middle lobe pulmonary nodule is weakly hypermetabolic and probably a metastatic lesion. 3. Faintly  accentuated activity in the left adrenal gland without a well-defined mass, indeterminate. 4. 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 Somewhat prominent right thyroid lobe compared to the left. 5. Other imaging findings of potential clinical significance: Aortic Atherosclerosis (ICD10-I70.0). Mild cardiomegaly. Cholelithiasis. Electronically Signed   By: Van Clines M.D.   On: 04/11/2020 14:13   US Venous Img Lower Bilateral  Result Date: 03/30/2020 CLINICAL DATA:  Shortness of breath, elevated D-dimer level. EXAM: BILATERAL LOWER EXTREMITY VENOUS DOPPLER ULTRASOUND TECHNIQUE: Gray-scale sonography with graded compression, as well as color Doppler and duplex ultrasound were performed to evaluate the lower extremity deep venous systems from the level of the common femoral vein and including the common femoral, femoral, profunda femoral, popliteal and calf veins including the posterior tibial, peroneal and gastrocnemius veins when visible. The superficial great saphenous vein was also interrogated. Spectral Doppler was utilized to evaluate flow at rest and with distal augmentation maneuvers in the common femoral, femoral and popliteal veins. COMPARISON:  November 26, 2013. FINDINGS: RIGHT LOWER EXTREMITY Common Femoral Vein: No evidence of thrombus. Normal compressibility, respiratory phasicity and response to augmentation. Saphenofemoral Junction: No evidence of thrombus. Normal compressibility and flow on color Doppler imaging. Profunda Femoral Vein: No evidence of thrombus. Normal compressibility and flow on color Doppler imaging. Femoral Vein: No evidence of thrombus. Normal compressibility, respiratory phasicity and response to augmentation. Popliteal Vein: No evidence of thrombus. Normal compressibility, respiratory phasicity and response to augmentation. Calf Veins: No evidence of thrombus. Normal compressibility and flow on color Doppler imaging. Superficial Great Saphenous Vein: No evidence  of thrombus. Normal compressibility. Venous Reflux:  None. Other Findings:  None. LEFT LOWER EXTREMITY Common Femoral Vein: No evidence of thrombus. Normal compressibility, respiratory phasicity and response to augmentation. Saphenofemoral Junction: No evidence of thrombus. Normal compressibility and flow on color Doppler imaging. Profunda Femoral Vein: No evidence of thrombus. Normal compressibility and flow on color Doppler imaging. Femoral Vein: No evidence of thrombus. Normal compressibility, respiratory phasicity and response to augmentation. Popliteal Vein: No evidence of thrombus. Normal compressibility, respiratory phasicity and response to augmentation. Calf Veins: No evidence of thrombus. Normal compressibility and flow on color Doppler imaging. Superficial Great Saphenous Vein: No evidence of thrombus. Normal compressibility. Venous Reflux:  None. Other Findings:  None. IMPRESSION: No evidence of deep venous thrombosis in either lower extremity. Electronically Signed   By: Marijo Conception M.D.   On: 03/30/2020 15:40   CT BIOPSY  Result Date: 04/14/2020 INDICATION: Imaging findings concerning for lung cancer with pleural involvement EXAM: CT-GUIDED BIOPSY RIGHT PLEURAL NODULAR MASS MEDICATIONS: 1% LIDOCAINE  LOCAL ANESTHESIA/SEDATION: 0.5 mg IV Versed; 50 mcg IV Fentanyl Moderate Sedation Time:  10 MINUTES The patient was continuously monitored during the procedure by the interventional radiology nurse under my direct supervision. PROCEDURE: The procedure, risks, benefits, and alternatives were explained to the patient. Questions regarding the procedure were encouraged and answered. The patient understands and consents to the procedure. Previous imaging reviewed. Patient position prone. Noncontrast localization CT performed. The posteromedial pleural nodular mass was localized and marked for a lateral posterior approach. Under sterile conditions and local anesthesia, CT guidance utilized to advance a 17  gauge 6.8 cm access needle to the right pleural nodular mass. Needle position confirmed with CT. 18 gauge core biopsies obtained. Samples were intact and non fragmented. These were placed in formalin. Postprocedure imaging demonstrates no pneumothorax or pulmonary hemorrhage. Patient tolerated the procedure well without complication. Vital sign monitoring by nursing staff during the procedure will continue as patient is in the special procedures unit for post procedure observation. FINDINGS: The images document guide needle placement within the right posterior pleural nodule or mass. Post biopsy images demonstrate no pneumothorax or pulmonary hemorrhage. COMPLICATIONS: None immediate. IMPRESSION: Successful CT-guided core biopsy of the right posterior pleural nodular mass Electronically Signed   By: Jerilynn Mages.  Shick M.D.   On: 04/14/2020 13:48   DG Chest Port 1 View  Result Date: 04/14/2020 CLINICAL DATA:  Suspect metastatic lung cancer, right effusion, status post thoracentesis EXAM: PORTABLE CHEST 1 VIEW COMPARISON:  04/11/2020 FINDINGS: Improvement in the right effusion following thoracentesis. No pneumothorax. Small residual right effusion remains. Stable right hilar mass and paratracheal adenopathy. Stable left lung aeration. Trachea midline. Heart is enlarged. IMPRESSION: Negative for pneumothorax following right thoracentesis. Small right effusion remains. Known right hilar mass and right paratracheal adenopathy consistent with bronchogenic carcinoma. Electronically Signed   By: Jerilynn Mages.  Shick M.D.   On: 04/14/2020 11:20   DG Chest Port 1 View  Result Date: 03/31/2020 CLINICAL DATA:  Right pleural effusion, status post thoracentesis EXAM: PORTABLE CHEST 1 VIEW COMPARISON:  03/30/2020 FINDINGS: Interval decrease in volume of a moderate right pleural effusion, now small, with associated atelectasis or consolidation. Redemonstrated mass of the suprahilar right lung. Cardiomegaly. IMPRESSION: 1. Interval decrease in  volume of a moderate right pleural effusion, now small, with associated atelectasis or consolidation. No pneumothorax. 2. Redemonstrated mass of the suprahilar right lung. Electronically Signed   By: Eddie Candle M.D.   On: 03/31/2020 13:37   CT Angio Chest/Abd/Pel for Dissection W and/or Wo Contrast  Result Date: 03/30/2020 CLINICAL DATA:  Chest and back pain. EXAM: CT ANGIOGRAPHY CHEST, ABDOMEN AND PELVIS TECHNIQUE: Non-contrast CT of the chest was initially obtained. Multidetector CT imaging through the chest, abdomen and pelvis was performed using the standard protocol during bolus administration of intravenous contrast. Multiplanar reconstructed images and MIPs were obtained and reviewed to evaluate the vascular anatomy. CONTRAST:  115mL OMNIPAQUE IOHEXOL 350 MG/ML SOLN COMPARISON:  None. FINDINGS: CTA CHEST FINDINGS Cardiovascular: Preferential opacification of the thoracic aorta. No evidence of thoracic aortic aneurysm or dissection. Normal heart size. No pericardial effusion. Mediastinum/Nodes: The esophagus is unremarkable. 6.4 x 3.7 cm right paratracheal adenopathy is noted concerning for malignancy or metastatic disease. 1.9 cm right hilar lymph node is noted. 12 mm anterior mediastinal lymph node is noted concerning for metastatic disease. 2.8 cm right thyroid nodule is noted. Lungs/Pleura: No pneumothorax is noted. Left lung is clear. Moderate size right pleural effusion is noted with associated atelectasis of the right lower lobe. Several  pleural-based masses are noted posteriorly concerning for metastatic disease. Large irregular opacity is noted posteriorly in the right upper lobe which may represent malignancy or associated postobstructive atelectasis. 12 x 9 mm nodule is noted in right middle lobe concerning for metastatic disease. Musculoskeletal: Lytic lesion is noted in the manubrium consistent with metastatic disease. Review of the MIP images confirms the above findings. CTA ABDOMEN AND  PELVIS FINDINGS VASCULAR Aorta: Normal caliber aorta without aneurysm, dissection, vasculitis or significant stenosis. Celiac: Patent without evidence of aneurysm, dissection, vasculitis or significant stenosis. SMA: Patent without evidence of aneurysm, dissection, vasculitis or significant stenosis. Renals: Both renal arteries are patent without evidence of aneurysm, dissection, vasculitis, fibromuscular dysplasia or significant stenosis. IMA: Patent without evidence of aneurysm, dissection, vasculitis or significant stenosis. Inflow: Patent without evidence of aneurysm, dissection, vasculitis or significant stenosis. Veins: No obvious venous abnormality within the limitations of this arterial phase study. Review of the MIP images confirms the above findings. NON-VASCULAR Hepatobiliary: Cholelithiasis is noted. No biliary dilatation is noted. The liver is unremarkable. Pancreas: Unremarkable. No pancreatic ductal dilatation or surrounding inflammatory changes. Spleen: Normal in size without focal abnormality. Adrenals/Urinary Tract: Adrenal glands are unremarkable. Kidneys are normal, without renal calculi, focal lesion, or hydronephrosis. Bladder is unremarkable. Stomach/Bowel: Stomach is within normal limits. Appendix appears normal. No evidence of bowel wall thickening, distention, or inflammatory changes. Lymphatic: No significant adenopathy is noted in the abdomen or pelvis. Reproductive: Mild prostatic enlargement is noted. Other: No abdominal wall hernia or abnormality. No abdominopelvic ascites. Musculoskeletal: No acute or significant osseous findings. Review of the MIP images confirms the above findings. IMPRESSION: 1. There is no evidence of thoracic or abdominal aortic dissection or aneurysm. 2. Mediastinal adenopathy is noted concerning for malignancy or metastatic disease. 3. Moderate size right pleural effusion is noted with associated atelectasis of the right lower lobe. There appears to be several  pleural based masses in the right upper lung zone concerning for metastatic disease. 4. Large irregular opacity is noted posteriorly in the right upper lobe which may represent malignancy or associated postobstructive atelectasis. 5. 12 x 9 mm nodule is noted in right middle lobe concerning for metastatic disease. 6. Lytic lesion is noted in the manubrium consistent with metastatic disease. 7. Cholelithiasis. 8. Mild prostatic enlargement. 9. 2.8 cm right thyroid nodule is noted. In the setting of significant comorbidities or limited life expectancy, no follow-up recommended. (Ref: J Am Coll Radiol. 2015 Feb;12(2): 143-50). Electronically Signed   By: Marijo Conception M.D.   On: 03/30/2020 14:10   US THORACENTESIS ASP PLEURAL SPACE W/IMG GUIDE  Result Date: 04/14/2020 INDICATION: Patient with suspected right lung cancer and recurrent right pleural effusions presents today for therapeutic thoracentesis prior to CT biopsy procedure. EXAM: ULTRASOUND GUIDED THORACENTESIS MEDICATIONS: 1% lidocaine 10 COMPLICATIONS: None immediate. PROCEDURE: An ultrasound guided thoracentesis was thoroughly discussed with the patient and questions answered. The benefits, risks, alternatives and complications were also discussed. The patient understands and wishes to proceed with the procedure. Written consent was obtained. Ultrasound was performed to localize and mark an adequate pocket of fluid in the right chest. The area was then prepped and draped in the normal sterile fashion. 1% Lidocaine was used for local anesthesia. Under ultrasound guidance a 6 Fr Safe-T-Centesis catheter was introduced. Thoracentesis was performed. The catheter was removed and a dressing applied. FINDINGS: A total of approximately 1.6 L of amber-colored fluid was removed. Samples were sent to the laboratory as requested by the clinical team. IMPRESSION: Successful  ultrasound guided right thoracentesis yielding 1.6 L of pleural fluid. Read by: Soyla Dryer, NP Electronically Signed   By: Jerilynn Mages.  Shick M.D.   On: 04/14/2020 11:21   US THORACENTESIS ASP PLEURAL SPACE W/IMG GUIDE  Result Date: 03/31/2020 INDICATION: Patient with newly discovered pleural-based masses and right pleural effusion presents today for therapeutic and diagnostic thoracentesis. EXAM: ULTRASOUND GUIDED THORACENTESIS MEDICATIONS: 1% lidocaine 10 mL COMPLICATIONS: None immediate. PROCEDURE: An ultrasound guided thoracentesis was thoroughly discussed with the patient and questions answered. The benefits, risks, alternatives and complications were also discussed. The patient understands and wishes to proceed with the procedure. Written consent was obtained. Ultrasound was performed to localize and mark an adequate pocket of fluid in the right chest. The area was then prepped and draped in the normal sterile fashion. 1% Lidocaine was used for local anesthesia. Under ultrasound guidance a 6 Fr Safe-T-Centesis catheter was introduced. Thoracentesis was performed. The catheter was removed and a dressing applied. FINDINGS: A total of approximately 1.3 L of amber-colored fluid was removed. Samples were sent to the laboratory as requested by the clinical team. IMPRESSION: Successful ultrasound guided right thoracentesis yielding 1.3 L of pleural fluid. Read by: Soyla Dryer, NP Electronically Signed   By: Miachel Roux M.D.   On: 03/31/2020 14:01    Labs:  CBC: Recent Labs    03/30/20 1035 03/31/20 0514 04/01/20 0424  WBC 13.3* 17.4* 20.4*  HGB 19.1* 18.8* 18.8*  HCT 55.7* 55.9* 55.6*  PLT 201 212 219    COAGS: Recent Labs    03/31/20 0514  INR 1.0    BMP: Recent Labs    03/30/20 1035 03/31/20 0514 04/01/20 0424  NA 139 137 139  K 4.4 4.2 4.7  CL 105 102 105  CO2 26 22 24   GLUCOSE 116* 166* 125*  BUN 11 17 22   CALCIUM 9.2 9.2 9.3  CREATININE 0.74 0.82 1.04  GFRNONAA >60 >60 >60    LIVER FUNCTION TESTS: Recent Labs    03/30/20 1035 03/31/20 0514   BILITOT 0.9 0.7  AST 36 31  ALT 55* 48*  ALKPHOS 66 68  PROT 7.2 7.0  ALBUMIN 3.9 3.7    TUMOR MARKERS: No results for input(s): AFPTM, CEA, CA199, CHROMGRNA in the last 8760 hours.  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  Plan for right IJ single lumen port placement with moderate sedation.   Electronically Signed: Suzette Battiest, MD 04/21/2020, 10:14 AM   I spent a total of15 Minutesin face to face in clinical consultation, greater than 50% of which was counseling/coordinating care for port placement.

## 2020-04-21 NOTE — Procedures (Signed)
Interventional Radiology Procedure Note  Procedure: Single Lumen Power Port Placement    Access:  Right internal jugular vein  Findings: Catheter tip positioned at cavoatrial junction. Port is ready for immediate use.   Complications: None  EBL: < 10 mL  Recommendations:  - Ok to shower in 24 hours - Do not submerge for 7 days - Routine line care     Ruthann Cancer, MD Pager: [phone]

## 2020-04-21 NOTE — Progress Notes (Signed)
Patient to [facility] for port placement with complaints of worsening shortness of breath as of today. Vital signs stable with oxygen saturation 92% on room air. Reached out to Dr Janese Banks at [facility], order for chest xray placed and will be completed shortly. Will continue to monitor patient and communicate with [name] to coordinate care for patient before he is discharged.

## 2020-04-21 NOTE — Progress Notes (Signed)
O2 sats at rest: 94% o2 sats with ambulation: 87% O2 sats with ambulation and O2 applied at 2L: 90% O2 sats with ambulation and O2 applied at 3L: 95%.  Patient stated that he did not feel much improvement in his shortness of breath with 2L, but after increasing to 3L, he stated that he felt that he was able to ambulate a little further before symptoms returned.

## 2020-04-21 NOTE — Discharge Instructions (Signed)
Moderate Conscious Sedation, Adult, Care After This sheet gives you information about how to care for yourself after your procedure. Your health care provider may also give you more specific instructions. If you have problems or questions, contact your health care provider. What can I expect after the procedure? After the procedure, it is common to have:  Sleepiness for several hours.  Impaired judgment for several hours.  Difficulty with balance.  Vomiting if you eat too soon. Follow these instructions at home: For the time period you were told by your health care provider:  Rest.  Do not participate in activities where you could fall or become injured.  Do not drive or use machinery.  Do not drink alcohol.  Do not take sleeping pills or medicines that cause drowsiness.  Do not make important decisions or sign legal documents.  Do not take care of children on your own.      Eating and drinking  Follow the diet recommended by your health care provider.  Drink enough fluid to keep your urine pale yellow.  If you vomit: ? Drink water, juice, or soup when you can drink without vomiting. ? Make sure you have little or no nausea before eating solid foods.   General instructions  Take over-the-counter and prescription medicines only as told by your health care provider.  Have a responsible adult stay with you for the time you are told. It is important to have someone help care for you until you are awake and alert.  Do not smoke.  Keep all follow-up visits as told by your health care provider. This is important. Contact a health care provider if:  You are still sleepy or having trouble with balance after 24 hours.  You feel light-headed.  You keep feeling nauseous or you keep vomiting.  You develop a rash.  You have a fever.  You have redness or swelling around the IV site. Get help right away if:  You have trouble breathing.  You have new-onset confusion at  home. Summary  After the procedure, it is common to feel sleepy, have impaired judgment, or feel nauseous if you eat too soon.  Rest after you get home. Know the things you should not do after the procedure.  Follow the diet recommended by your health care provider and drink enough fluid to keep your urine pale yellow.  Get help right away if you have trouble breathing or new-onset confusion at home. This information is not intended to replace advice given to you by your health care provider. Make sure you discuss any questions you have with your health care provider. Document Revised: 06/11/2019 Document Reviewed: 01/07/2019 Elsevier Patient Education  2021 [place] Insertion, Care After This sheet gives you information about how to care for yourself after your procedure. Your health care provider may also give you more specific instructions. If you have problems or questions, contact your health care provider. What can I expect after the procedure? After the procedure, it is common to have:  Discomfort at the port insertion site.  Bruising on the skin over the port. This should improve over 3-4 days. Follow these instructions at home: [facility] care  After your port is placed, you will get a manufacturer's information card. The card has information about your port. Keep this card with you at all times.  Take care of the port as told by your health care provider. Ask your health care provider if you or a family member can  get training for taking care of the port at home. A home health care nurse may also take care of the port.  Make sure to remember what type of port you have. Incision care  Follow instructions from your health care provider about how to take care of your port insertion site. Make sure you: ? Wash your hands with soap and water before and after you change your bandage (dressing). If soap and water are not available, use hand sanitizer. ? Change your  dressing as told by your health care provider. ? Leave stitches (sutures), skin glue, or adhesive strips in place. These skin closures may need to stay in place for 2 weeks or longer. If adhesive strip edges start to loosen and curl up, you may trim the loose edges. Do not remove adhesive strips completely unless your health care provider tells you to do that.  Check your port insertion site every day for signs of infection. Check for: ? Redness, swelling, or pain. ? Fluid or blood. ? Warmth. ? Pus or a bad smell.      Activity  Return to your normal activities as told by your health care provider. Ask your health care provider what activities are safe for you.  Do not lift anything that is heavier than 10 lb (4.5 kg), or the limit that you are told, until your health care provider says that it is safe. General instructions  Take over-the-counter and prescription medicines only as told by your health care provider.  Do not take baths, swim, or use a hot tub until your health care provider approves. Ask your health care provider if you may take showers. You may only be allowed to take sponge baths.  Do not drive for 24 hours if you were given a sedative during your procedure.  Wear a medical alert bracelet in case of an emergency. This will tell any health care providers that you have a port.  Keep all follow-up visits as told by your health care provider. This is important. Contact a health care provider if:  You cannot flush your port with saline as directed, or you cannot draw blood from the port.  You have a fever or chills.  You have redness, swelling, or pain around your port insertion site.  You have fluid or blood coming from your port insertion site.  Your port insertion site feels warm to the touch.  You have pus or a bad smell coming from the port insertion site. Get help right away if:  You have chest pain or shortness of breath.  You have bleeding from your port  that you cannot control. Summary  Take care of the port as told by your health care provider. Keep the manufacturer's information card with you at all times.  Change your dressing as told by your health care provider.  Contact a health care provider if you have a fever or chills or if you have redness, swelling, or pain around your port insertion site.  Keep all follow-up visits as told by your health care provider. This information is not intended to replace advice given to you by your health care provider. Make sure you discuss any questions you have with your health care provider. Document Revised: 09/09/2017 Document Reviewed: 09/09/2017 Elsevier Patient Education  [place].

## 2020-04-23 ENCOUNTER — Other Ambulatory Visit: Payer: Self-pay | Admitting: *Deleted

## 2020-04-23 DIAGNOSIS — C7951 Secondary malignant neoplasm of bone: Secondary | ICD-10-CM

## 2020-04-23 DIAGNOSIS — C3411 Malignant neoplasm of upper lobe, right bronchus or lung: Secondary | ICD-10-CM

## 2020-04-24 ENCOUNTER — Other Ambulatory Visit: Payer: Self-pay | Admitting: *Deleted

## 2020-04-24 ENCOUNTER — Encounter: Payer: Self-pay | Admitting: Oncology

## 2020-04-24 ENCOUNTER — Inpatient Hospital Stay: Payer: Managed Care, Other (non HMO)

## 2020-04-24 ENCOUNTER — Inpatient Hospital Stay (HOSPITAL_BASED_OUTPATIENT_CLINIC_OR_DEPARTMENT_OTHER): Payer: Managed Care, Other (non HMO) | Admitting: Hospice and Palliative Medicine

## 2020-04-24 ENCOUNTER — Other Ambulatory Visit: Payer: Self-pay | Admitting: Internal Medicine

## 2020-04-24 ENCOUNTER — Encounter: Payer: Self-pay | Admitting: *Deleted

## 2020-04-24 ENCOUNTER — Inpatient Hospital Stay: Payer: Managed Care, Other (non HMO) | Admitting: Oncology

## 2020-04-24 VITALS — BP 123/70 | HR 76 | Temp 96.5°F | Resp 20 | Wt 335.8 lb

## 2020-04-24 DIAGNOSIS — Z01812 Encounter for preprocedural laboratory examination: Secondary | ICD-10-CM | POA: Diagnosis not present

## 2020-04-24 DIAGNOSIS — C3411 Malignant neoplasm of upper lobe, right bronchus or lung: Secondary | ICD-10-CM | POA: Diagnosis present

## 2020-04-24 DIAGNOSIS — Z515 Encounter for palliative care: Secondary | ICD-10-CM | POA: Diagnosis not present

## 2020-04-24 DIAGNOSIS — J910 Malignant pleural effusion: Secondary | ICD-10-CM | POA: Diagnosis not present

## 2020-04-24 DIAGNOSIS — C3491 Malignant neoplasm of unspecified part of right bronchus or lung: Secondary | ICD-10-CM | POA: Diagnosis not present

## 2020-04-24 DIAGNOSIS — C7951 Secondary malignant neoplasm of bone: Secondary | ICD-10-CM

## 2020-04-24 DIAGNOSIS — F17210 Nicotine dependence, cigarettes, uncomplicated: Secondary | ICD-10-CM | POA: Diagnosis not present

## 2020-04-24 DIAGNOSIS — Z5112 Encounter for antineoplastic immunotherapy: Secondary | ICD-10-CM | POA: Diagnosis not present

## 2020-04-24 DIAGNOSIS — Z20822 Contact with and (suspected) exposure to covid-19: Secondary | ICD-10-CM | POA: Diagnosis not present

## 2020-04-24 DIAGNOSIS — G4733 Obstructive sleep apnea (adult) (pediatric): Secondary | ICD-10-CM | POA: Diagnosis not present

## 2020-04-24 DIAGNOSIS — Z7289 Other problems related to lifestyle: Secondary | ICD-10-CM | POA: Diagnosis not present

## 2020-04-24 DIAGNOSIS — Z7189 Other specified counseling: Secondary | ICD-10-CM

## 2020-04-24 DIAGNOSIS — C782 Secondary malignant neoplasm of pleura: Secondary | ICD-10-CM | POA: Diagnosis not present

## 2020-04-24 DIAGNOSIS — J449 Chronic obstructive pulmonary disease, unspecified: Secondary | ICD-10-CM | POA: Diagnosis not present

## 2020-04-24 DIAGNOSIS — Z95828 Presence of other vascular implants and grafts: Secondary | ICD-10-CM

## 2020-04-24 DIAGNOSIS — Z5111 Encounter for antineoplastic chemotherapy: Secondary | ICD-10-CM

## 2020-04-24 LAB — COMPREHENSIVE METABOLIC PANEL
ALT: 34 U/L (ref 0–44)
AST: 26 U/L (ref 15–41)
Albumin: 3.2 g/dL — ABNORMAL LOW (ref 3.5–5.0)
Alkaline Phosphatase: 76 U/L (ref 38–126)
Anion gap: 10 (ref 5–15)
BUN: 10 mg/dL (ref 8–23)
CO2: 25 mmol/L (ref 22–32)
Calcium: 9.1 mg/dL (ref 8.9–10.3)
Chloride: 103 mmol/L (ref 98–111)
Creatinine, Ser: 0.84 mg/dL (ref 0.61–1.24)
GFR, Estimated: >60 mL/min (ref >60)
Glucose, Bld: 132 mg/dL — ABNORMAL HIGH (ref 70–99)
Potassium: 3.8 mmol/L (ref 3.5–5.1)
Sodium: 138 mmol/L (ref 135–145)
Total Bilirubin: 0.6 mg/dL (ref 0.3–1.2)
Total Protein: 6.4 g/dL — ABNORMAL LOW (ref 6.5–8.1)

## 2020-04-24 LAB — CBC WITH DIFFERENTIAL/PLATELET
Abs Immature Granulocytes: 0.1 10*3/uL — ABNORMAL HIGH (ref 0.00–0.07)
Basophils Absolute: 0.1 10*3/uL (ref 0.0–0.1)
Basophils Relative: 1 %
Eosinophils Absolute: 0.3 10*3/uL (ref 0.0–0.5)
Eosinophils Relative: 2 %
HCT: 53.8 % — ABNORMAL HIGH (ref 39.0–52.0)
Hemoglobin: 18.2 g/dL — ABNORMAL HIGH (ref 13.0–17.0)
Immature Granulocytes: 1 %
Lymphocytes Relative: 16 %
Lymphs Abs: 1.8 10*3/uL (ref 0.7–4.0)
MCH: 31.3 pg (ref 26.0–34.0)
MCHC: 33.8 g/dL (ref 30.0–36.0)
MCV: 92.6 fL (ref 80.0–100.0)
Monocytes Absolute: 0.9 10*3/uL (ref 0.1–1.0)
Monocytes Relative: 8 %
Neutro Abs: 7.9 10*3/uL — ABNORMAL HIGH (ref 1.7–7.7)
Neutrophils Relative %: 72 %
Platelets: 219 10*3/uL (ref 150–400)
RBC: 5.81 MIL/uL (ref 4.22–5.81)
RDW: 13.4 % (ref 11.5–15.5)
WBC: 11.1 10*3/uL — ABNORMAL HIGH (ref 4.0–10.5)
nRBC: 0 % (ref 0.0–0.2)

## 2020-04-24 LAB — HEPATITIS B CORE ANTIBODY, TOTAL: Hep B Core Total Ab: NONREACTIVE

## 2020-04-24 LAB — HEPATITIS B SURFACE ANTIGEN: Hepatitis B Surface Ag: NONREACTIVE

## 2020-04-24 LAB — TSH: TSH: 4.981 u[IU]/mL — ABNORMAL HIGH (ref 0.350–4.500)

## 2020-04-24 MED ORDER — SODIUM CHLORIDE 0.9 % IV SOLN
750.0000 mg | Freq: Once | INTRAVENOUS | Status: AC
  Administered 2020-04-24: 750 mg via INTRAVENOUS
  Filled 2020-04-24: qty 75

## 2020-04-24 MED ORDER — SODIUM CHLORIDE 0.9 % IV SOLN
150.0000 mg | Freq: Once | INTRAVENOUS | Status: AC
  Administered 2020-04-24: 150 mg via INTRAVENOUS
  Filled 2020-04-24: qty 150

## 2020-04-24 MED ORDER — SODIUM CHLORIDE 0.9 % IV SOLN
1200.0000 mg | Freq: Once | INTRAVENOUS | Status: AC
  Administered 2020-04-24: 1200 mg via INTRAVENOUS
  Filled 2020-04-24: qty 20

## 2020-04-24 MED ORDER — MORPHINE SULFATE (CONCENTRATE) 5 MG/0.25ML PO SOLN: 5.0000 mg | ORAL | 0 refills | Status: DC | PRN

## 2020-04-24 MED ORDER — SODIUM CHLORIDE 0.9 % IV SOLN
10.0000 mg | Freq: Once | INTRAVENOUS | Status: AC
  Administered 2020-04-24: 10 mg via INTRAVENOUS
  Filled 2020-04-24: qty 10

## 2020-04-24 MED ORDER — HEPARIN SOD (PORK) LOCK FLUSH 100 UNIT/ML IV SOLN
INTRAVENOUS | Status: AC
  Filled 2020-04-24: qty 5

## 2020-04-24 MED ORDER — SODIUM CHLORIDE 0.9 % IV SOLN
100.0000 mg/m2 | Freq: Once | INTRAVENOUS | Status: AC
  Administered 2020-04-24: 280 mg via INTRAVENOUS
  Filled 2020-04-24: qty 14

## 2020-04-24 MED ORDER — HEPARIN SOD (PORK) LOCK FLUSH 100 UNIT/ML IV SOLN
500.0000 [IU] | Freq: Once | INTRAVENOUS | Status: AC
  Administered 2020-04-24: 500 [IU] via INTRAVENOUS
  Filled 2020-04-24: qty 5

## 2020-04-24 MED ORDER — PALONOSETRON HCL INJECTION 0.25 MG/5ML
0.2500 mg | Freq: Once | INTRAVENOUS | Status: AC
  Administered 2020-04-24: 0.25 mg via INTRAVENOUS
  Filled 2020-04-24: qty 5

## 2020-04-24 MED ORDER — SODIUM CHLORIDE 0.9% FLUSH
10.0000 mL | Freq: Once | INTRAVENOUS | Status: AC
  Administered 2020-04-24: 10 mL via INTRAVENOUS
  Filled 2020-04-24: qty 10

## 2020-04-24 MED ORDER — SODIUM CHLORIDE 0.9 % IV SOLN
Freq: Once | INTRAVENOUS | Status: AC
  Filled 2020-04-24: qty 250

## 2020-04-24 NOTE — Progress Notes (Signed)
David Johnson  Telephone:([phone] [phone]   Name: David Johnson Date: 04/24/2020 MRN: [id number]  DOB: Feb 25, 1946  Patient Care Team: [place] Pink, MD as PCP - General (Family Medicine)    REASON FOR CONSULTATION: David Johnson is a 75 y.o. 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  PMH is also notable for COPD and OSA requiring CPAP.  Palliative care was consulted help address goals manage ongoing symptoms.   SOCIAL HISTORY:     reports that he has been smoking cigarettes and cigars. He has a 52.50 pack-year smoking history. He has never used smokeless tobacco. He reports current alcohol use of about 8.0 standard drinks of alcohol per week. He reports that he does not use drugs.  Patient is married and lives at home with his wife and daughter.  He has another daughter who lives nearby.  Patient works as a Medical laboratory scientific officer for [employer]:  None on file  CODE STATUS:   PAST MEDICAL HISTORY: Past Medical History:  Diagnosis Date  . Adenomatous polyps   . Barrett's esophagus   . Colon polyp   . COPD (chronic obstructive pulmonary disease) ([place])    uses inhaler, "i may have COPD"  . Dyspnea   . Eye injury   . GERD (gastroesophageal reflux disease)   . History of gonorrhea   . History of pneumonia   . Obesity   . Pneumonia   . Reflux esophagitis   . Sleep apnea     PAST SURGICAL HISTORY:  Past Surgical History:  Procedure Laterality Date  . CATARACT EXTRACTION Left   . COLONOSCOPY    . COLONOSCOPY WITH PROPOFOL N/A 05/19/2015   Procedure: COLONOSCOPY WITH PROPOFOL;  Surgeon: [name], MD;  Location: [facility] ENDOSCOPY;  Service: Endoscopy;  Laterality: N/A;  . COLONOSCOPY WITH PROPOFOL N/A 09/11/2018   Procedure: COLONOSCOPY WITH PROPOFOL;  Surgeon: [name], MD;   Location[facility] ENDOSCOPY;  Service: Endoscopy;  Laterality: N/A;  . ESOPHAGOGASTRODUODENOSCOPY (EGD) WITH PROPOFOL N/A 05/19/2015   Procedure: ESOPHAGOGASTRODUODENOSCOPY (EGD) WITH PROPOFOL;  Surgeon: [name], MD;  Location: [facility] ENDOSCOPY;  Service: Endoscopy;  Laterality: N/A;  . ESOPHAGOGASTRODUODENOSCOPY (EGD) WITH PROPOFOL N/A 10/11/2016   Procedure: ESOPHAGOGASTRODUODENOSCOPY (EGD) WITH PROPOFOL;  Surgeon: [name], MD;  Location: [facility] ENDOSCOPY;  Service: Endoscopy;  Laterality: N/A;  . ESOPHAGOGASTRODUODENOSCOPY (EGD) WITH PROPOFOL N/A 01/01/2018   Procedure: ESOPHAGOGASTRODUODENOSCOPY (EGD) WITH PROPOFOL;  Surgeon: [name], MD;  Location: [facility] ENDOSCOPY;  Service: Endoscopy;  Laterality: N/A;  . ESOPHAGOGASTRODUODENOSCOPY (EGD) WITH PROPOFOL N/A 09/11/2018   Procedure: ESOPHAGOGASTRODUODENOSCOPY (EGD) WITH PROPOFOL;  Surgeon: [name], MD;  Location: [facility] ENDOSCOPY;  Service: Endoscopy;  Laterality: N/A;  . EYE SURGERY    . IR IMAGING GUIDED PORT INSERTION  04/21/2020    HEMATOLOGY/ONCOLOGY HISTORY:  Oncology History  Small cell lung cancer, right ([place])  04/18/2020 Initial Diagnosis   Small cell lung cancer, right ([place])   04/18/2020 Cancer Staging   Staging form: Lung, AJCC 8th Edition - Clinical: Stage IVB (cT3, cN2, pM1c) - Signed by Sindy Guadeloupe, MD on 04/18/2020   04/24/2020 -  Chemotherapy    Patient is on Treatment Plan: LUNG SCLC CARBOPLATIN + ETOPOSIDE + ATEZOLIZUMAB INDUCTION Q21D / ATEZOLIZUMAB MAINTENANCE Q21D        ALLERGIES:  is allergic to cephalexin.  MEDICATIONS:  Current Outpatient  Medications  Medication Sig Dispense Refill  . albuterol (PROVENTIL HFA;VENTOLIN HFA) 108 (90 Base) MCG/ACT inhaler 2 puffs q.i.d. p.r.n. short of breath, wheezing, or cough    . lidocaine-prilocaine (EMLA) cream Apply to affected area once 30 g 3  . LORazepam (ATIVAN) 0.5 MG tablet Take 1 tablet (0.5 mg total) by mouth 3 (three) times daily  as needed (Nausea or vomiting). (Patient not taking: Reported on 04/24/2020) 30 tablet 0  . Morphine Sulfate, Concentrate, 5 MG/0.25ML SOLN Take 5 mg by mouth every 4 (four) hours as needed. 180 mL 0  . neomycin-polymyxin-dexameth (MAXITROL) 0.1 % OINT Administer 1 application into the left eye 4 (four) times a day as needed.    . ondansetron (ZOFRAN) 8 MG tablet Take 1 tablet (8 mg total) by mouth 2 (two) times daily as needed for refractory nausea / vomiting. Start on day 3 after carboplatin chemo. (Patient not taking: Reported on 04/24/2020) 30 tablet 1  . prochlorperazine (COMPAZINE) 10 MG tablet Take 1 tablet (10 mg total) by mouth every 6 (six) hours as needed (Nausea or vomiting). (Patient not taking: Reported on 04/24/2020) 30 tablet 1   No current facility-administered medications for this visit.   Facility-Administered Medications Ordered in Other Visits  Medication Dose Route Frequency Provider Last Rate Last Admin  . atezolizumab (TECENTRIQ) 1,200 mg in sodium chloride 0.9 % 250 mL chemo infusion  1,200 mg Intravenous Once Sindy Guadeloupe, MD 270 mL/hr at 04/24/20 1059 1,200 mg at 04/24/20 1059  . CARBOplatin (PARAPLATIN) 750 mg in sodium chloride 0.9 % 250 mL chemo infusion  750 mg Intravenous Once Sindy Guadeloupe, MD      . etoposide (VEPESID) 280 mg in sodium chloride 0.9 % 1,000 mL chemo infusion  100 mg/m2 (Treatment Plan Recorded) Intravenous Once Sindy Guadeloupe, MD      . heparin lock flush 100 unit/mL  500 Units Intravenous Once Sindy Guadeloupe, MD        VITAL SIGNS: There were no vitals taken for this visit. There were no vitals filed for this visit.  Estimated body mass index is 44.3 kg/m as calculated from the following:   Height as of 04/21/20: _0  (1.854 m).   Weight as of an earlier encounter on 04/24/20: 335 lb 12.8 oz (152.3 kg).  LABS: CBC:    Component Value Date/Time   WBC 11.1 (H) 04/24/2020 0816   HGB 18.2 (H) 04/24/2020 0816   HGB 17.3 05/13/2013 1335   HCT  53.8 (H) 04/24/2020 0816   HCT 51.8 05/13/2013 1335   PLT 219 04/24/2020 0816   PLT 170 05/13/2013 1335   MCV 92.6 04/24/2020 0816   MCV 93 05/13/2013 1335   NEUTROABS 7.9 (H) 04/24/2020 0816   NEUTROABS 7.0 (H) 05/13/2013 1335   LYMPHSABS 1.8 04/24/2020 0816   LYMPHSABS 3.2 05/13/2013 1335   MONOABS 0.9 04/24/2020 0816   MONOABS 1.0 05/13/2013 1335   EOSABS 0.3 04/24/2020 0816   EOSABS 0.2 05/13/2013 1335   BASOSABS 0.1 04/24/2020 0816   BASOSABS 0.1 05/13/2013 1335   Comprehensive Metabolic Panel:    Component Value Date/Time   NA 138 04/24/2020 0816   K 3.8 04/24/2020 0816   CL 103 04/24/2020 0816   CO2 25 04/24/2020 0816   BUN 10 04/24/2020 0816   CREATININE 0.84 04/24/2020 0816   GLUCOSE 132 (H) 04/24/2020 0816   CALCIUM 9.1 04/24/2020 0816   AST 26 04/24/2020 0816   ALT 34 04/24/2020 0816   ALKPHOS  76 04/24/2020 0816   BILITOT 0.6 04/24/2020 0816   PROT 6.4 (L) 04/24/2020 0816   ALBUMIN 3.2 (L) 04/24/2020 0816    RADIOGRAPHIC STUDIES: DG Chest 1 View  Result Date: 04/21/2020 CLINICAL DATA:  Shortness of breath, pleural effusion. EXAM: CHEST  1 VIEW COMPARISON:  Chest radiograph done earlier the same day and CT chest 03/30/2020. FINDINGS: Single lateral view of the chest shows a large pleural effusion, shown to be on the right on the frontal view performed earlier the same day. Associated collapse/consolidation in the adjacent right lung. No left pleural effusion. IMPRESSION: Large right pleural effusion with associated collapse/consolidation in the adjacent lung. Electronically Signed   By: Lorin Picket M.D.   On: 04/21/2020 11:41   DG Chest 2 View  Result Date: 03/30/2020 CLINICAL DATA:  Pain and stiffness EXAM: CHEST - 2 VIEW COMPARISON:  June 03, 2016 FINDINGS: Cardiomediastinal silhouette is enlarged in contour. There is a rounded nodular opacity projecting along the RIGHT superior hilar border. Possible thickening of the RIGHT paratracheal stripe. There is a  moderate RIGHT pleural effusion. No significant LEFT pleural effusion. Homogeneous opacification of the RIGHT lower lung. Mild diffuse interstitial prominence. Visualized abdomen is unremarkable. Mild degenerative changes of the thoracic spine. IMPRESSION: 1. Rounded nodular opacity projecting along the RIGHT superior hilar border with possible thickening of the RIGHT paratracheal stripe. Consider further evaluation with dedicated cross-sectional imaging. 2. Moderate RIGHT pleural effusion. 3. Mild pulmonary edema. Homogeneous opacification of the RIGHT lower lung, most likely atelectasis. Electronically Signed   By: Valentino Saxon MD   On: 03/30/2020 10:28   MR BRAIN W WO CONTRAST  Result Date: 04/01/2020 CLINICAL DATA:  Non-small cell lung cancer staging. EXAM: MRI HEAD WITHOUT AND WITH CONTRAST TECHNIQUE: Multiplanar, multiecho pulse sequences of the brain and surrounding structures were obtained without and with intravenous contrast. CONTRAST:  28m GADAVIST GADOBUTROL 1 MMOL/ML IV SOLN COMPARISON:  None. FINDINGS: Brain: No acute infarction, hemorrhage, hydrocephalus, extra-axial collection or mass lesion. Diffuse cerebral atrophy with prominence of the extra-axial spaces along the frontal convexity. No abnormal enhancement. Vascular: Major arterial flow voids are maintained at the skull base. Skull and upper cervical spine: Normal marrow signal. Sinuses/Orbits: Sinuses are largely clear. Other: Nonspecific 1 cm mildly T2 hyperintense, T1 isointense lesion within the left temporal bone (series 12, image 65). IMPRESSION: 1. No evidence of acute abnormality or intraparenchymal metastatic disease. 2. Nonspecific 1 cm osseous lesion within the left temporal bone. This could represent a benign process such as fibrous dysplasia, but is nonspecific by MRI. If there is no diagnostic prior outside imaging, recommend CT of the temporal bone with contrast to further evaluate and exclude osseous metastatic disease.  Electronically Signed   By: FMargaretha SheffieldMD   On: 04/01/2020 10:04   NM PET Image Initial (PI) Skull Base To Thigh  Result Date: 04/11/2020 CLINICAL DATA:  Initial treatment strategy for right middle lobe lung nodule and mediastinal adenopathy. Abnormal opacity in the right upper lobe, possible mass. EXAM: NUCLEAR MEDICINE PET SKULL BASE TO THIGH TECHNIQUE: 16.1 mCi F-18 FDG was injected intravenously. Full-ring PET imaging was performed from the skull base to thigh after the radiotracer. CT data was obtained and used for attenuation correction and anatomic localization. Fasting blood glucose: One hundred twenty-eight mg/dl COMPARISON:  Chest CT 03/30/2020 FINDINGS: Mediastinal blood pool activity: SUV max 3.0 Liver activity: SUV max NA NECK: Diffuse thyroid activity likely from thyroiditis. Mildly prominent right thyroid lobe. Maximum SUV of the right  thyroid lobe is 10.2 and the left thyroid lobe 7.3. No definite hypermetabolic activity in the vicinity of the small left temporal bone lesion shown on recent MRI. Incidental CT findings: None CHEST: Abnormal bandlike lesion in the right upper lobe measuring about 6.8 by 2.4 cm, maximum SUV 10.9, high suspicion for malignancy. Large right pleural effusion with hypermetabolic rind of pleural tissue measuring up to about 1.4 cm in thickness on image 93 series 3, maximum SUV of this rind 13.8, compatible with malignant pleural effusion. Conglomerate right paratracheal adenopathy measuring about 3.8 by 5.9 cm on image 106 series 3, maximum SUV 17.1, compatible with malignancy. A right hilar node measuring 1.5 cm in short axis on image 119 of series 3 has a maximum SUV of 10.0, compatible with malignancy. Hypermetabolic lymph nodes are present in the right pericardial space. One of several hypermetabolic lymph nodes measures 1.4 cm in short axis on image 147 of series 3 with maximum SUV 9.0 1.3 by 1.0 cm right middle lobe nodule with adjacent mild nodularity noted  just below the minor fissure, maximum SUV 3.7, compatible with malignancy. Hypermetabolic activity along the collapsed right lower lobe could be within the lower lobe for along the visceral pleural margin, measuring about 1.2 by 1.8 cm on image 149 series 3 with maximum SUV 8.3. Incidental CT findings: Large malignant right pleural effusion. Mild cardiomegaly. Mild atherosclerotic calcification of the aortic arch. ABDOMEN/PELVIS: I do not see a discrete adrenal mass but there is some faintly accentuated activity along the left adrenal gland with maximum SUV 7.0. Speckled or noisy PET signal in the abdomen. Incidental CT findings: Cholelithiasis mild abdominal aortic atherosclerosis. SKELETON: There are few scattered skeletal metastatic lesions. This includes a lytic lesion of the sternal manubrium measuring approximately 2.9 by 1.8 cm on image 91 of series 3, maximum SUV 7.6. 1.4 by 1.1 cm lytic lesion left iliac crest on image 237 series 3, maximum SUV 8.1. A small hypermetabolic lesion in the right femoral neck has a maximum SUV of 9.7 but is poorly delineated on the CT data. Speckled/noisy signal along the spine without well-defined metastatic involvement of the spine. Activity along the left proximal humerus is thought to probably be in the joint rather than in the humeral head, and accordingly probably physiologic. There is a small focus of hypermetabolic activity in the right posterior iliac bone without CT correlate, maximum SUV 7.3. There is a hypermetabolic lesion in the left mid humeral shaft centrally, maximum SUV 6.5. Incidental CT findings: Thoracic spondylosis. IMPRESSION: 1. Hypermetabolic right upper lobe mass with a few scattered osseous metastatic lesions including the sternal manubrium, bony pelvis, left mid humerus, and right proximal femur. Large conglomerate right paratracheal adenopathy with hypermetabolic right hilar adenopathy, a large malignant right pleural effusion with hypermetabolic  pleural rind, and hypermetabolic pericardial lymph nodes. 2. The 1.3 by 1.0 cm right middle lobe pulmonary nodule is weakly hypermetabolic and probably a metastatic lesion. 3. Faintly accentuated activity in the left adrenal gland without a well-defined mass, indeterminate. 4. 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 Somewhat prominent right thyroid lobe compared to the left. 5. Other imaging findings of potential clinical significance: Aortic Atherosclerosis (ICD10-I70.0). Mild cardiomegaly. Cholelithiasis. Electronically Signed   By: Van Clines M.D.   On: 04/11/2020 14:13   US Venous Img Lower Bilateral  Result Date: 03/30/2020 CLINICAL DATA:  Shortness of breath, elevated D-dimer level. EXAM: BILATERAL LOWER EXTREMITY VENOUS DOPPLER ULTRASOUND TECHNIQUE: Gray-scale sonography with graded compression, as well as color Doppler  and duplex ultrasound were performed to evaluate the lower extremity deep venous systems from the level of the common femoral vein and including the common femoral, femoral, profunda femoral, popliteal and calf veins including the posterior tibial, peroneal and gastrocnemius veins when visible. The superficial great saphenous vein was also interrogated. Spectral Doppler was utilized to evaluate flow at rest and with distal augmentation maneuvers in the common femoral, femoral and popliteal veins. COMPARISON:  November 26, 2013. FINDINGS: RIGHT LOWER EXTREMITY Common Femoral Vein: No evidence of thrombus. Normal compressibility, respiratory phasicity and response to augmentation. Saphenofemoral Junction: No evidence of thrombus. Normal compressibility and flow on color Doppler imaging. Profunda Femoral Vein: No evidence of thrombus. Normal compressibility and flow on color Doppler imaging. Femoral Vein: No evidence of thrombus. Normal compressibility, respiratory phasicity and response to augmentation. Popliteal Vein: No evidence of thrombus. Normal compressibility,  respiratory phasicity and response to augmentation. Calf Veins: No evidence of thrombus. Normal compressibility and flow on color Doppler imaging. Superficial Great Saphenous Vein: No evidence of thrombus. Normal compressibility. Venous Reflux:  None. Other Findings:  None. LEFT LOWER EXTREMITY Common Femoral Vein: No evidence of thrombus. Normal compressibility, respiratory phasicity and response to augmentation. Saphenofemoral Junction: No evidence of thrombus. Normal compressibility and flow on color Doppler imaging. Profunda Femoral Vein: No evidence of thrombus. Normal compressibility and flow on color Doppler imaging. Femoral Vein: No evidence of thrombus. Normal compressibility, respiratory phasicity and response to augmentation. Popliteal Vein: No evidence of thrombus. Normal compressibility, respiratory phasicity and response to augmentation. Calf Veins: No evidence of thrombus. Normal compressibility and flow on color Doppler imaging. Superficial Great Saphenous Vein: No evidence of thrombus. Normal compressibility. Venous Reflux:  None. Other Findings:  None. IMPRESSION: No evidence of deep venous thrombosis in either lower extremity. Electronically Signed   By: Marijo Conception M.D.   On: 03/30/2020 15:40   CT BIOPSY  Result Date: 04/14/2020 INDICATION: Imaging findings concerning for lung cancer with pleural involvement EXAM: CT-GUIDED BIOPSY RIGHT PLEURAL NODULAR MASS MEDICATIONS: 1% LIDOCAINE LOCAL ANESTHESIA/SEDATION: 0.5 mg IV Versed; 50 mcg IV Fentanyl Moderate Sedation Time:  10 MINUTES The patient was continuously monitored during the procedure by the interventional radiology nurse under my direct supervision. PROCEDURE: The procedure, risks, benefits, and alternatives were explained to the patient. Questions regarding the procedure were encouraged and answered. The patient understands and consents to the procedure. Previous imaging reviewed. Patient position prone. Noncontrast localization CT  performed. The posteromedial pleural nodular mass was localized and marked for a lateral posterior approach. Under sterile conditions and local anesthesia, CT guidance utilized to advance a 17 gauge 6.8 cm access needle to the right pleural nodular mass. Needle position confirmed with CT. 18 gauge core biopsies obtained. Samples were intact and non fragmented. These were placed in formalin. Postprocedure imaging demonstrates no pneumothorax or pulmonary hemorrhage. Patient tolerated the procedure well without complication. Vital sign monitoring by nursing staff during the procedure will continue as patient is in the special procedures unit for post procedure observation. FINDINGS: The images document guide needle placement within the right posterior pleural nodule or mass. Post biopsy images demonstrate no pneumothorax or pulmonary hemorrhage. COMPLICATIONS: None immediate. IMPRESSION: Successful CT-guided core biopsy of the right posterior pleural nodular mass Electronically Signed   By: Jerilynn Mages.  Shick M.D.   On: 04/14/2020 13:48   DG Chest Port 1 View  Result Date: 04/21/2020 CLINICAL DATA:  Status post thoracentesis. Small cell lung cancer. Pleural effusion. EXAM: PORTABLE CHEST 1 VIEW COMPARISON:  04/21/2020 FINDINGS: RIGHT-sided PowerPort tip overlies the superior vena cava. RIGHT-sided pleural effusion is significantly smaller. There is persistent RIGHT LOWER lobe opacity likely representing atelectasis and possible consolidation. There is no pneumothorax. LEFT lung is clear. IMPRESSION: Decreased RIGHT pleural effusion. No pneumothorax. Electronically Signed   By: Nolon Nations M.D.   On: 04/21/2020 16:51   DG Chest Port 1 View  Result Date: 04/21/2020 CLINICAL DATA:  Worsening shortness of breath. EXAM: PORTABLE CHEST 1 VIEW COMPARISON:  04/14/2020 FINDINGS: Two AP portable radiographs. Right Port-A-Cath tip at superior caval/atrial junction. Midline trachea. Mild cardiomegaly. Significant enlargement  of moderate right pleural effusion. No left-sided pleural fluid or pneumothorax. The right hilar mass and paratracheal adenopathy are partially obscured by progressive right mid and lower lung airspace disease. No overt congestive failure. Left lung is clear. IMPRESSION: Increased moderate right pleural effusion with worsening right-sided aeration, most likely due to atelectasis. Right hilar mass and paratracheal adenopathy are partially obscured by progressive right-sided airspace disease. Electronically Signed   By: Abigail Miyamoto M.D.   On: 04/21/2020 10:57   DG Chest Port 1 View  Result Date: 04/14/2020 CLINICAL DATA:  Suspect metastatic lung cancer, right effusion, status post thoracentesis EXAM: PORTABLE CHEST 1 VIEW COMPARISON:  04/11/2020 FINDINGS: Improvement in the right effusion following thoracentesis. No pneumothorax. Small residual right effusion remains. Stable right hilar mass and paratracheal adenopathy. Stable left lung aeration. Trachea midline. Heart is enlarged. IMPRESSION: Negative for pneumothorax following right thoracentesis. Small right effusion remains. Known right hilar mass and right paratracheal adenopathy consistent with bronchogenic carcinoma. Electronically Signed   By: Jerilynn Mages.  Shick M.D.   On: 04/14/2020 11:20   DG Chest Port 1 View  Result Date: 03/31/2020 CLINICAL DATA:  Right pleural effusion, status post thoracentesis EXAM: PORTABLE CHEST 1 VIEW COMPARISON:  03/30/2020 FINDINGS: Interval decrease in volume of a moderate right pleural effusion, now small, with associated atelectasis or consolidation. Redemonstrated mass of the suprahilar right lung. Cardiomegaly. IMPRESSION: 1. Interval decrease in volume of a moderate right pleural effusion, now small, with associated atelectasis or consolidation. No pneumothorax. 2. Redemonstrated mass of the suprahilar right lung. Electronically Signed   By: Eddie Candle M.D.   On: 03/31/2020 13:37   CT Angio Chest/Abd/Pel for Dissection W  and/or Wo Contrast  Result Date: 03/30/2020 CLINICAL DATA:  Chest and back pain. EXAM: CT ANGIOGRAPHY CHEST, ABDOMEN AND PELVIS TECHNIQUE: Non-contrast CT of the chest was initially obtained. Multidetector CT imaging through the chest, abdomen and pelvis was performed using the standard protocol during bolus administration of intravenous contrast. Multiplanar reconstructed images and MIPs were obtained and reviewed to evaluate the vascular anatomy. CONTRAST:  134m OMNIPAQUE IOHEXOL 350 MG/ML SOLN COMPARISON:  None. FINDINGS: CTA CHEST FINDINGS Cardiovascular: Preferential opacification of the thoracic aorta. No evidence of thoracic aortic aneurysm or dissection. Normal heart size. No pericardial effusion. Mediastinum/Nodes: The esophagus is unremarkable. 6.4 x 3.7 cm right paratracheal adenopathy is noted concerning for malignancy or metastatic disease. 1.9 cm right hilar lymph node is noted. 12 mm anterior mediastinal lymph node is noted concerning for metastatic disease. 2.8 cm right thyroid nodule is noted. Lungs/Pleura: No pneumothorax is noted. Left lung is clear. Moderate size right pleural effusion is noted with associated atelectasis of the right lower lobe. Several pleural-based masses are noted posteriorly concerning for metastatic disease. Large irregular opacity is noted posteriorly in the right upper lobe which may represent malignancy or associated postobstructive atelectasis. 12 x 9 mm nodule is noted in right middle  lobe concerning for metastatic disease. Musculoskeletal: Lytic lesion is noted in the manubrium consistent with metastatic disease. Review of the MIP images confirms the above findings. CTA ABDOMEN AND PELVIS FINDINGS VASCULAR Aorta: Normal caliber aorta without aneurysm, dissection, vasculitis or significant stenosis. Celiac: Patent without evidence of aneurysm, dissection, vasculitis or significant stenosis. SMA: Patent without evidence of aneurysm, dissection, vasculitis or  significant stenosis. Renals: Both renal arteries are patent without evidence of aneurysm, dissection, vasculitis, fibromuscular dysplasia or significant stenosis. IMA: Patent without evidence of aneurysm, dissection, vasculitis or significant stenosis. Inflow: Patent without evidence of aneurysm, dissection, vasculitis or significant stenosis. Veins: No obvious venous abnormality within the limitations of this arterial phase study. Review of the MIP images confirms the above findings. NON-VASCULAR Hepatobiliary: Cholelithiasis is noted. No biliary dilatation is noted. The liver is unremarkable. Pancreas: Unremarkable. No pancreatic ductal dilatation or surrounding inflammatory changes. Spleen: Normal in size without focal abnormality. Adrenals/Urinary Tract: Adrenal glands are unremarkable. Kidneys are normal, without renal calculi, focal lesion, or hydronephrosis. Bladder is unremarkable. Stomach/Bowel: Stomach is within normal limits. Appendix appears normal. No evidence of bowel wall thickening, distention, or inflammatory changes. Lymphatic: No significant adenopathy is noted in the abdomen or pelvis. Reproductive: Mild prostatic enlargement is noted. Other: No abdominal wall hernia or abnormality. No abdominopelvic ascites. Musculoskeletal: No acute or significant osseous findings. Review of the MIP images confirms the above findings. IMPRESSION: 1. There is no evidence of thoracic or abdominal aortic dissection or aneurysm. 2. Mediastinal adenopathy is noted concerning for malignancy or metastatic disease. 3. Moderate size right pleural effusion is noted with associated atelectasis of the right lower lobe. There appears to be several pleural based masses in the right upper lung zone concerning for metastatic disease. 4. Large irregular opacity is noted posteriorly in the right upper lobe which may represent malignancy or associated postobstructive atelectasis. 5. 12 x 9 mm nodule is noted in right middle lobe  concerning for metastatic disease. 6. Lytic lesion is noted in the manubrium consistent with metastatic disease. 7. Cholelithiasis. 8. Mild prostatic enlargement. 9. 2.8 cm right thyroid nodule is noted. In the setting of significant comorbidities or limited life expectancy, no follow-up recommended. (Ref: J Am Coll Radiol. 2015 Feb;12(2): 143-50). Electronically Signed   By: Marijo Conception M.D.   On: 03/30/2020 14:10   IR IMAGING GUIDED PORT INSERTION  Result Date: 04/21/2020 INDICATION: 75 year old male with history of advanced stage right lung cancer requiring central venous access for chemotherapy. EXAM: IMPLANTED PORT A CATH PLACEMENT WITH ULTRASOUND AND FLUOROSCOPIC GUIDANCE COMPARISON:  None. MEDICATIONS: None. ANESTHESIA/SEDATION: Moderate (conscious) sedation was employed during this procedure. A total of Versed 1 mg and Fentanyl 50 mcg was administered intravenously. Moderate Sedation Time: 11 minutes. The patient's level of consciousness and vital signs were monitored continuously by radiology nursing throughout the procedure under my direct supervision. CONTRAST:  None FLUOROSCOPY TIME:  0.3 minutes (2.7 mGy) COMPLICATIONS: None immediate. PROCEDURE: The procedure, risks, benefits, and alternatives were explained to the patient. Questions regarding the procedure were encouraged and answered. The patient understands and consents to the procedure. The right neck and chest were prepped with chlorhexidine in a sterile fashion, and a sterile drape was applied covering the operative field. Maximum barrier sterile technique with sterile gowns and gloves were used for the procedure. A timeout was performed prior to the initiation of the procedure. Ultrasound was used to examine the jugular vein which was compressible and free of internal echoes. A skin marker was used to  demarcate the planned venotomy and port pocket incision sites. Local anesthesia was provided to these sites and the subcutaneous tunnel  track with 1% lidocaine with 1:100,000 epinephrine. A small incision was created at the jugular access site and blunt dissection was performed of the subcutaneous tissues. Under real time ultrasound guidance, the jugular vein was accessed with a 21 ga micropuncture needle and an 0.018" wire was inserted to the superior vena cava. A 5 Fr micopuncture set was then used, through which a 0.035" Rosen wire was passed under fluoroscopic guidance into the inferior vena cava. An 8 Fr dilator was then placed over the wire. A subcutaneous port pocket was then created along the upper chest wall utilizing a combination of sharp and blunt dissection. The pocket was irrigated with sterile saline, packed with gauze, and observed for hemorrhage. A single lumen "ISP" sized power injectable port was chosen for placement. The 8 Fr catheter was tunneled from the port pocket site to the venotomy incision. The port was placed in the pocket. The external catheter was trimmed to appropriate length. The dilator was exchanged for an 8 Fr peel-away sheath under fluoroscopic guidance. The catheter was then placed through the sheath and the sheath was removed. Final catheter positioning was confirmed and documented with a fluoroscopic spot radiograph. The port was accessed with a Huber needle, aspirated, and flushed with heparinized saline. The deep dermal layer of the port pocket incision was closed with interrupted 3-0 Vicryl suture. Dermabond was then placed over the port pocket and neck incisions. The patient tolerated the procedure well without immediate post procedural complication. FINDINGS: After catheter placement, the tip lies within the cavoatrial junction. The catheter aspirates and flushes normally and is ready for immediate use. IMPRESSION: Successful placement of a power injectable Port-A-Cath via the right internal jugular vein. The catheter is ready for immediate use. Ruthann Cancer, MD V[facility] Inter[facility] Radiology  [facility] Electronically Signed   By: Ruthann Cancer MD   On: 04/21/2020 14:20   US THORACENTESIS ASP PLEURAL SPACE W/IMG GUIDE  Result Date: 04/24/2020 INDICATION: Right-sided lung cancer. Recurrent right-sided pleural effusion. Request for or therapeutic thoracentesis. EXAM: ULTRASOUND GUIDED RIGHT THORACENTESIS MEDICATIONS: 1% plain lidocaine, 10 mL COMPLICATIONS: None immediate. PROCEDURE: An ultrasound guided thoracentesis was thoroughly discussed with the patient and questions answered. The benefits, risks, alternatives and complications were also discussed. The patient understands and wishes to proceed with the procedure. Written consent was obtained. Ultrasound was performed to localize and mark an adequate pocket of fluid in the right chest. The area was then prepped and draped in the normal sterile fashion. 1% Lidocaine was used for local anesthesia. Under ultrasound guidance a 6 Fr Safe-T-Centesis catheter was introduced. Thoracentesis was performed. The catheter was removed and a dressing applied. FINDINGS: A total of approximately 1.7 L of blood-tinged fluid was removed. IMPRESSION: Successful ultrasound guided right thoracentesis yielding 1.7 L of pleural fluid. Read by: Ascencion Dike PA-C Electronically Signed   By: Ruthann Cancer MD   On: 04/21/2020 16:49   US THORACENTESIS ASP PLEURAL SPACE W/IMG GUIDE  Result Date: 04/14/2020 INDICATION: Patient with suspected right lung cancer and recurrent right pleural effusions presents today for therapeutic thoracentesis prior to CT biopsy procedure. EXAM: ULTRASOUND GUIDED THORACENTESIS MEDICATIONS: 1% lidocaine 10 COMPLICATIONS: None immediate. PROCEDURE: An ultrasound guided thoracentesis was thoroughly discussed with the patient and questions answered. The benefits, risks, alternatives and complications were also discussed. The patient understands and wishes to proceed with the procedure. Written consent  was obtained.  Ultrasound was performed to localize and mark an adequate pocket of fluid in the right chest. The area was then prepped and draped in the normal sterile fashion. 1% Lidocaine was used for local anesthesia. Under ultrasound guidance a 6 Fr Safe-T-Centesis catheter was introduced. Thoracentesis was performed. The catheter was removed and a dressing applied. FINDINGS: A total of approximately 1.6 L of amber-colored fluid was removed. Samples were sent to the laboratory as requested by the clinical team. IMPRESSION: Successful ultrasound guided right thoracentesis yielding 1.6 L of pleural fluid. Read by: Soyla Dryer, NP Electronically Signed   By: Jerilynn Mages.  Shick M.D.   On: 04/14/2020 11:21   US THORACENTESIS ASP PLEURAL SPACE W/IMG GUIDE  Result Date: 03/31/2020 INDICATION: Patient with newly discovered pleural-based masses and right pleural effusion presents today for therapeutic and diagnostic thoracentesis. EXAM: ULTRASOUND GUIDED THORACENTESIS MEDICATIONS: 1% lidocaine 10 mL COMPLICATIONS: None immediate. PROCEDURE: An ultrasound guided thoracentesis was thoroughly discussed with the patient and questions answered. The benefits, risks, alternatives and complications were also discussed. The patient understands and wishes to proceed with the procedure. Written consent was obtained. Ultrasound was performed to localize and mark an adequate pocket of fluid in the right chest. The area was then prepped and draped in the normal sterile fashion. 1% Lidocaine was used for local anesthesia. Under ultrasound guidance a 6 Fr Safe-T-Centesis catheter was introduced. Thoracentesis was performed. The catheter was removed and a dressing applied. FINDINGS: A total of approximately 1.3 L of amber-colored fluid was removed. Samples were sent to the laboratory as requested by the clinical team. IMPRESSION: Successful ultrasound guided right thoracentesis yielding 1.3 L of pleural fluid. Read by: Soyla Dryer, NP Electronically  Signed   By: Miachel Roux M.D.   On: 03/31/2020 14:01    PERFORMANCE STATUS (ECOG) : 1 - Symptomatic but completely ambulatory  Review of Systems Unless otherwise noted, a complete review of systems is negative.  Physical Exam General: NAD Pulmonary: Unlabored Extremities: no edema, no joint deformities Skin: no rashes Neurological: Weakness but otherwise nonfocal  IMPRESSION: I met with patient he was receiving treatment in the infusion area.  Introduced count of care services and attempted to establish therapeutic rapport.  Patient reports at baseline, he lives at home with his wife and daughter.  He is functionally independent with his own care but has found exertional dyspnea to be somewhat limiting his ability to bathe.  Discussed option of ordering him a shower chair and he felt this was a good idea.  I called Zack Blank with [facility] and gave a verbal order.  Patient denies distressing symptoms at present.  He reports improved sleep with use of CPAP.  Patient says he is somewhat anxious as he begins starting treatment.  Emotional support provided and I validated these feelings as normal.  He says that he is accepting of the diagnosis and prognosis.  Patient will benefit from future conversation regarding advance directives. None are currently on file.   PLAN: -Continue current scope of treatment -DME: Shower chair -We will benefit from future ACP conversation -Follow-up MyChart visit 1 month   Patient expressed understanding and was in agreement with this plan. He also understands that He can call the clinic at any time with any questions, concerns, or complaints.     Time Total: 15 minutes  Visit consisted of counseling and education dealing with the complex and emotionally intense issues of symptom management and palliative care in the setting of serious and  potentially life-threatening illness.Greater than 50%  of this time was spent counseling and coordinating  care related to the above assessment and plan.  Signed by: Altha Harm, PhD, NP-C

## 2020-04-24 NOTE — Addendum Note (Signed)
Addended by: Randa Evens C on: 04/24/2020 01:11 PM   Modules accepted: Orders

## 2020-04-24 NOTE — Progress Notes (Addendum)
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 Past medical history significant for COPD, GERD and obstructive sleep apnea. CT angio chest abdomen pelvis showed 6.4 x 3.7 cm right paratracheal adenopathy concerning for malignancy. 1.9 cm right hilar node. 12 mm anterior mediastinal lymph node. 2.8 cm right thyroid nodule. Several pleural-based masses concerning for metastatic disease. Large irregular opacity in the right upper lobe which may be malignancy versus postobstructive atelectasis. Lytic lesion noted in the manubrium consistent with metastatic disease.  Moderate right-sided pleural effusion s/p thoracentesis.    Cytology showedPoorly differentiated carcinoma with neuroendocrine differentiation.  Tumor cells were positive for pancytokeratin and CD56 but negative for TTF-1 p40 and synaptophysin.  Chromogranin stain was also negative.  MRI brain was negative for metastatic disease.  Interval history-patient reports that he had a thoracentesis on 04/21/2020 following which his shortness of breath improved mildly.  He is back to feeling short of breath but not as much as he did on Friday.  He reports ongoing fatigue  ECOG PS- 1 Pain scale- 0 Opioid associated constipation- no  Review of systems- Review of Systems  Constitutional: Positive for  malaise/fatigue. Negative for chills, fever and weight loss.  HENT: Negative for congestion, ear discharge and nosebleeds.   Eyes: Negative for blurred vision.  Respiratory: Positive for shortness of breath. Negative for cough, hemoptysis, sputum production and wheezing.   Cardiovascular: Negative for chest pain, palpitations, orthopnea and claudication.  Gastrointestinal: Negative for abdominal pain, blood in stool, constipation, diarrhea, heartburn, melena, nausea and vomiting.  Genitourinary: Negative for dysuria, flank pain, frequency, hematuria and urgency.  Musculoskeletal: Negative for back pain, joint pain and myalgias.  Skin: Negative for rash.  Neurological: Negative for dizziness, tingling, focal weakness, seizures, weakness and headaches.  Endo/Heme/Allergies: Does not bruise/bleed easily.  Psychiatric/Behavioral: Negative for depression and suicidal ideas. The patient does not have insomnia.       Allergies  Allergen Reactions  . Cephalexin Rash     Past Medical History:  Diagnosis Date  . Adenomatous polyps   . Barrett's esophagus   . Colon polyp   . COPD (chronic obstructive pulmonary disease) ([place])    uses inhaler, "i may have COPD"  . Dyspnea   . Eye injury   . GERD (gastroesophageal reflux disease)   . History of gonorrhea   . History of pneumonia   . Obesity   . Pneumonia   . Reflux esophagitis   . Sleep apnea      Past Surgical History:  Procedure Laterality Date  . CATARACT EXTRACTION Left   . COLONOSCOPY    . COLONOSCOPY WITH PROPOFOL N/A 05/19/2015   Procedure: COLONOSCOPY WITH PROPOFOL;  Surgeon: [name], MD;  Location: [facility] ENDOSCOPY;  Service: Endoscopy;  Laterality: N/A;  . COLONOSCOPY WITH PROPOFOL N/A 09/11/2018   Procedure: COLONOSCOPY WITH PROPOFOL;  Surgeon: [name], MD;  Location: [facility] ENDOSCOPY;  Service: Endoscopy;  Laterality: N/A;  . ESOPHAGOGASTRODUODENOSCOPY (EGD) WITH PROPOFOL N/A 05/19/2015   Procedure:  ESOPHAGOGASTRODUODENOSCOPY (EGD) WITH PROPOFOL;  Surgeon: [name], MD;  Location[facility] ENDOSCOPY;  Service: Endoscopy;  Laterality: N/A;  . ESOPHAGOGASTRODUODENOSCOPY (EGD) WITH PROPOFOL N/A 10/11/2016   Procedure: ESOPHAGOGASTRODUODENOSCOPY (EGD) WITH PROPOFOL;  Surgeon: [name], MD;  Location: [facility] ENDOSCOPY;  Service: Endoscopy;  Laterality: N/A;  . ESOPHAGOGASTRODUODENOSCOPY (EGD) WITH PROPOFOL N/A 01/01/2018   Procedure: ESOPHAGOGASTRODUODENOSCOPY (EGD) WITH PROPOFOL;  Surgeon: [name], MD;  Location: [facility] ENDOSCOPY;  Service: Endoscopy;  Laterality: N/A;  . ESOPHAGOGASTRODUODENOSCOPY (EGD) WITH PROPOFOL N/A 09/11/2018   Procedure: ESOPHAGOGASTRODUODENOSCOPY (EGD) WITH PROPOFOL;  Surgeon: [name], MD;  Location: [facility] ENDOSCOPY;  Service: Endoscopy;  Laterality: N/A;  . EYE SURGERY    . IR IMAGING GUIDED PORT INSERTION  04/21/2020    Social History   Socioeconomic History  . Marital status: Married    Spouse name: Not on file  . Number of children: Not on file  . Years of education: Not on file  . Highest education level: Not on file  Occupational History  . Not on file  Tobacco Use  . Smoking status: Current Every Day Smoker    Packs/day: 1.50    Years: 35.00    Pack years: 52.50    Types: Cigarettes, Cigars    Last attempt to quit: 03/04/2015    Years since quitting: 5.1  . Smokeless tobacco: Never Used  Vaping Use  . Vaping Use: Every day  . Substances: Flavoring  Substance and Sexual Activity  . Alcohol use: Yes    Alcohol/week: 8.0 standard drinks    Types: 8 Cans of beer per week    Comment: Occassionally on weekends   . Drug use: No  . Sexual activity: Not on file  Other Topics Concern  . Not on file  Social History Narrative  . Not on file   Social Determinants of Health   Financial Resource Strain: Not on file  Food Insecurity: Not on file  Transportation Needs: Not on file  Physical Activity: Not on file  Stress:  Not on file  Social Connections: Not on file  Intimate Partner Violence: Not on file    Family History  Problem Relation Age of Onset  . Emphysema Mother      Current Outpatient Medications:  .  albuterol (PROVENTIL HFA;VENTOLIN HFA) 108 (90 Base) MCG/ACT inhaler, 2 puffs q.i.d. p.r.n. short of breath, wheezing, or cough, Disp: , Rfl:  .  lidocaine-prilocaine (EMLA) cream, Apply to affected area once, Disp: 30 g, Rfl: 3 .  LORazepam (ATIVAN) 0.5 MG tablet, Take 1 tablet (0.5 mg total) by mouth 3 (three) times daily as needed (Nausea or vomiting). (Patient not taking: Reported on 04/24/2020), Disp: 30 tablet, Rfl: 0 .  Morphine Sulfate, Concentrate, 5 MG/0.25ML SOLN, Take 5 mg by mouth every 4 (four) hours as needed., Disp: 180 mL, Rfl: 0 .  neomycin-polymyxin-dexameth (MAXITROL) 0.1 % OINT, Administer 1 application into the left eye 4 (four) times a day as needed., Disp: , Rfl:  .  ondansetron (ZOFRAN) 8 MG tablet, Take 1 tablet (8 mg total) by mouth 2 (two) times daily as needed for refractory nausea / vomiting. Start on day 3 after carboplatin chemo. (Patient not taking: Reported on 04/24/2020), Disp: 30 tablet, Rfl: 1 .  prochlorperazine (COMPAZINE) 10 MG tablet, Take 1 tablet (10 mg total) by mouth every 6 (six) hours as needed (Nausea or vomiting). (Patient not taking: Reported on  04/24/2020), Disp: 30 tablet, Rfl: 1 No current facility-administered medications for this visit.  Facility-Administered Medications Ordered in Other Visits:  .  CARBOplatin (PARAPLATIN) 750 mg in sodium chloride 0.9 % 250 mL chemo infusion, 750 mg, Intravenous, Once, Sindy Guadeloupe, MD .  etoposide (VEPESID) 280 mg in sodium chloride 0.9 % 1,000 mL chemo infusion, 100 mg/m2 (Treatment Plan Recorded), Intravenous, Once, Sindy Guadeloupe, MD, Last Rate: 1,014 mL/hr at 04/24/20 1210, 280 mg at 04/24/20 1210 .  heparin lock flush 100 unit/mL, 500 Units, Intravenous, Once, Sindy Guadeloupe, MD  Physical exam:  Vitals:    04/24/20 0844  BP: 123/70  Pulse: 76  Resp: 20  Temp: (!) 96.5 F (35.8 C)  SpO2: 95%  Weight: (!) 335 lb 12.8 oz (152.3 kg)   Physical Exam Constitutional:      General: He is not in acute distress. Eyes:     Extraocular Movements: EOM normal.  Cardiovascular:     Rate and Rhythm: Normal rate and regular rhythm.     Heart sounds: Normal heart sounds.  Pulmonary:     Effort: Pulmonary effort is normal.     Comments: Breath sounds decreased bilaterally at the bases right greater than left Abdominal:     General: Bowel sounds are normal.     Palpations: Abdomen is soft.  Skin:    General: Skin is warm and dry.  Neurological:     Mental Status: He is alert and oriented to person, place, and time.      CMP Latest Ref Rng & Units 04/24/2020  Glucose 70 - 99 mg/dL 132(H)  BUN 8 - 23 mg/dL 10  Creatinine 0.61 - 1.24 mg/dL 0.84  Sodium 135 - 145 mmol/L 138  Potassium 3.5 - 5.1 mmol/L 3.8  Chloride 98 - 111 mmol/L 103  CO2 22 - 32 mmol/L 25  Calcium 8.9 - 10.3 mg/dL 9.1  Total Protein 6.5 - 8.1 g/dL 6.4(L)  Total Bilirubin 0.3 - 1.2 mg/dL 0.6  Alkaline Phos 38 - 126 U/L 76  AST 15 - 41 U/L 26  ALT 0 - 44 U/L 34   CBC Latest Ref Rng & Units 04/24/2020  WBC 4.0 - 10.5 K/uL 11.1(H)  Hemoglobin 13.0 - 17.0 g/dL 18.2(H)  Hematocrit 39.0 - 52.0 % 53.8(H)  Platelets 150 - 400 K/uL 219    No images are attached to the encounter.  DG Chest 1 View  Result Date: 04/21/2020 CLINICAL DATA:  Shortness of breath, pleural effusion. EXAM: CHEST  1 VIEW COMPARISON:  Chest radiograph done earlier the same day and CT chest 03/30/2020. FINDINGS: Single lateral view of the chest shows a large pleural effusion, shown to be on the right on the frontal view performed earlier the same day. Associated collapse/consolidation in the adjacent right lung. No left pleural effusion. IMPRESSION: Large right pleural effusion with associated collapse/consolidation in the adjacent lung. Electronically  Signed   By: Lorin Picket M.D.   On: 04/21/2020 11:41   DG Chest 2 View  Result Date: 03/30/2020 CLINICAL DATA:  Pain and stiffness EXAM: CHEST - 2 VIEW COMPARISON:  June 03, 2016 FINDINGS: Cardiomediastinal silhouette is enlarged in contour. There is a rounded nodular opacity projecting along the RIGHT superior hilar border. Possible thickening of the RIGHT paratracheal stripe. There is a moderate RIGHT pleural effusion. No significant LEFT pleural effusion. Homogeneous opacification of the RIGHT lower lung. Mild diffuse interstitial prominence. Visualized abdomen is unremarkable. Mild degenerative changes of the thoracic spine. IMPRESSION: 1.  Rounded nodular opacity projecting along the RIGHT superior hilar border with possible thickening of the RIGHT paratracheal stripe. Consider further evaluation with dedicated cross-sectional imaging. 2. Moderate RIGHT pleural effusion. 3. Mild pulmonary edema. Homogeneous opacification of the RIGHT lower lung, most likely atelectasis. Electronically Signed   By: Valentino Saxon MD   On: 03/30/2020 10:28   MR BRAIN W WO CONTRAST  Result Date: 04/01/2020 CLINICAL DATA:  Non-small cell lung cancer staging. EXAM: MRI HEAD WITHOUT AND WITH CONTRAST TECHNIQUE: Multiplanar, multiecho pulse sequences of the brain and surrounding structures were obtained without and with intravenous contrast. CONTRAST:  31mL GADAVIST GADOBUTROL 1 MMOL/ML IV SOLN COMPARISON:  None. FINDINGS: Brain: No acute infarction, hemorrhage, hydrocephalus, extra-axial collection or mass lesion. Diffuse cerebral atrophy with prominence of the extra-axial spaces along the frontal convexity. No abnormal enhancement. Vascular: Major arterial flow voids are maintained at the skull base. Skull and upper cervical spine: Normal marrow signal. Sinuses/Orbits: Sinuses are largely clear. Other: Nonspecific 1 cm mildly T2 hyperintense, T1 isointense lesion within the left temporal bone (series 12, image 65).  IMPRESSION: 1. No evidence of acute abnormality or intraparenchymal metastatic disease. 2. Nonspecific 1 cm osseous lesion within the left temporal bone. This could represent a benign process such as fibrous dysplasia, but is nonspecific by MRI. If there is no diagnostic prior outside imaging, recommend CT of the temporal bone with contrast to further evaluate and exclude osseous metastatic disease. Electronically Signed   By: Margaretha Sheffield MD   On: 04/01/2020 10:04   NM PET Image Initial (PI) Skull Base To Thigh  Result Date: 04/11/2020 CLINICAL DATA:  Initial treatment strategy for right middle lobe lung nodule and mediastinal adenopathy. Abnormal opacity in the right upper lobe, possible mass. EXAM: NUCLEAR MEDICINE PET SKULL BASE TO THIGH TECHNIQUE: 16.1 mCi F-18 FDG was injected intravenously. Full-ring PET imaging was performed from the skull base to thigh after the radiotracer. CT data was obtained and used for attenuation correction and anatomic localization. Fasting blood glucose: One hundred twenty-eight mg/dl COMPARISON:  Chest CT 03/30/2020 FINDINGS: Mediastinal blood pool activity: SUV max 3.0 Liver activity: SUV max NA NECK: Diffuse thyroid activity likely from thyroiditis. Mildly prominent right thyroid lobe. Maximum SUV of the right thyroid lobe is 10.2 and the left thyroid lobe 7.3. No definite hypermetabolic activity in the vicinity of the small left temporal bone lesion shown on recent MRI. Incidental CT findings: None CHEST: Abnormal bandlike lesion in the right upper lobe measuring about 6.8 by 2.4 cm, maximum SUV 10.9, high suspicion for malignancy. Large right pleural effusion with hypermetabolic rind of pleural tissue measuring up to about 1.4 cm in thickness on image 93 series 3, maximum SUV of this rind 13.8, compatible with malignant pleural effusion. Conglomerate right paratracheal adenopathy measuring about 3.8 by 5.9 cm on image 106 series 3, maximum SUV 17.1, compatible with  malignancy. A right hilar node measuring 1.5 cm in short axis on image 119 of series 3 has a maximum SUV of 10.0, compatible with malignancy. Hypermetabolic lymph nodes are present in the right pericardial space. One of several hypermetabolic lymph nodes measures 1.4 cm in short axis on image 147 of series 3 with maximum SUV 9.0 1.3 by 1.0 cm right middle lobe nodule with adjacent mild nodularity noted just below the minor fissure, maximum SUV 3.7, compatible with malignancy. Hypermetabolic activity along the collapsed right lower lobe could be within the lower lobe for along the visceral pleural margin, measuring about 1.2 by 1.8  cm on image 149 series 3 with maximum SUV 8.3. Incidental CT findings: Large malignant right pleural effusion. Mild cardiomegaly. Mild atherosclerotic calcification of the aortic arch. ABDOMEN/PELVIS: I do not see a discrete adrenal mass but there is some faintly accentuated activity along the left adrenal gland with maximum SUV 7.0. Speckled or noisy PET signal in the abdomen. Incidental CT findings: Cholelithiasis mild abdominal aortic atherosclerosis. SKELETON: There are few scattered skeletal metastatic lesions. This includes a lytic lesion of the sternal manubrium measuring approximately 2.9 by 1.8 cm on image 91 of series 3, maximum SUV 7.6. 1.4 by 1.1 cm lytic lesion left iliac crest on image 237 series 3, maximum SUV 8.1. A small hypermetabolic lesion in the right femoral neck has a maximum SUV of 9.7 but is poorly delineated on the CT data. Speckled/noisy signal along the spine without well-defined metastatic involvement of the spine. Activity along the left proximal humerus is thought to probably be in the joint rather than in the humeral head, and accordingly probably physiologic. There is a small focus of hypermetabolic activity in the right posterior iliac bone without CT correlate, maximum SUV 7.3. There is a hypermetabolic lesion in the left mid humeral shaft centrally,  maximum SUV 6.5. Incidental CT findings: Thoracic spondylosis. IMPRESSION: 1. Hypermetabolic right upper lobe mass with a few scattered osseous metastatic lesions including the sternal manubrium, bony pelvis, left mid humerus, and right proximal femur. Large conglomerate right paratracheal adenopathy with hypermetabolic right hilar adenopathy, a large malignant right pleural effusion with hypermetabolic pleural rind, and hypermetabolic pericardial lymph nodes. 2. The 1.3 by 1.0 cm right middle lobe pulmonary nodule is weakly hypermetabolic and probably a metastatic lesion. 3. Faintly accentuated activity in the left adrenal gland without a well-defined mass, indeterminate. 4. 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 Somewhat prominent right thyroid lobe compared to the left. 5. Other imaging findings of potential clinical significance: Aortic Atherosclerosis (ICD10-I70.0). Mild cardiomegaly. Cholelithiasis. Electronically Signed   By: Van Clines M.D.   On: 04/11/2020 14:13   US Venous Img Lower Bilateral  Result Date: 03/30/2020 CLINICAL DATA:  Shortness of breath, elevated D-dimer level. EXAM: BILATERAL LOWER EXTREMITY VENOUS DOPPLER ULTRASOUND TECHNIQUE: Gray-scale sonography with graded compression, as well as color Doppler and duplex ultrasound were performed to evaluate the lower extremity deep venous systems from the level of the common femoral vein and including the common femoral, femoral, profunda femoral, popliteal and calf veins including the posterior tibial, peroneal and gastrocnemius veins when visible. The superficial great saphenous vein was also interrogated. Spectral Doppler was utilized to evaluate flow at rest and with distal augmentation maneuvers in the common femoral, femoral and popliteal veins. COMPARISON:  November 26, 2013. FINDINGS: RIGHT LOWER EXTREMITY Common Femoral Vein: No evidence of thrombus. Normal compressibility, respiratory phasicity and response to  augmentation. Saphenofemoral Junction: No evidence of thrombus. Normal compressibility and flow on color Doppler imaging. Profunda Femoral Vein: No evidence of thrombus. Normal compressibility and flow on color Doppler imaging. Femoral Vein: No evidence of thrombus. Normal compressibility, respiratory phasicity and response to augmentation. Popliteal Vein: No evidence of thrombus. Normal compressibility, respiratory phasicity and response to augmentation. Calf Veins: No evidence of thrombus. Normal compressibility and flow on color Doppler imaging. Superficial Great Saphenous Vein: No evidence of thrombus. Normal compressibility. Venous Reflux:  None. Other Findings:  None. LEFT LOWER EXTREMITY Common Femoral Vein: No evidence of thrombus. Normal compressibility, respiratory phasicity and response to augmentation. Saphenofemoral Junction: No evidence of thrombus. Normal compressibility and flow on  color Doppler imaging. Profunda Femoral Vein: No evidence of thrombus. Normal compressibility and flow on color Doppler imaging. Femoral Vein: No evidence of thrombus. Normal compressibility, respiratory phasicity and response to augmentation. Popliteal Vein: No evidence of thrombus. Normal compressibility, respiratory phasicity and response to augmentation. Calf Veins: No evidence of thrombus. Normal compressibility and flow on color Doppler imaging. Superficial Great Saphenous Vein: No evidence of thrombus. Normal compressibility. Venous Reflux:  None. Other Findings:  None. IMPRESSION: No evidence of deep venous thrombosis in either lower extremity. Electronically Signed   By: Marijo Conception M.D.   On: 03/30/2020 15:40   CT BIOPSY  Result Date: 04/14/2020 INDICATION: Imaging findings concerning for lung cancer with pleural involvement EXAM: CT-GUIDED BIOPSY RIGHT PLEURAL NODULAR MASS MEDICATIONS: 1% LIDOCAINE LOCAL ANESTHESIA/SEDATION: 0.5 mg IV Versed; 50 mcg IV Fentanyl Moderate Sedation Time:  10 MINUTES The  patient was continuously monitored during the procedure by the interventional radiology nurse under my direct supervision. PROCEDURE: The procedure, risks, benefits, and alternatives were explained to the patient. Questions regarding the procedure were encouraged and answered. The patient understands and consents to the procedure. Previous imaging reviewed. Patient position prone. Noncontrast localization CT performed. The posteromedial pleural nodular mass was localized and marked for a lateral posterior approach. Under sterile conditions and local anesthesia, CT guidance utilized to advance a 17 gauge 6.8 cm access needle to the right pleural nodular mass. Needle position confirmed with CT. 18 gauge core biopsies obtained. Samples were intact and non fragmented. These were placed in formalin. Postprocedure imaging demonstrates no pneumothorax or pulmonary hemorrhage. Patient tolerated the procedure well without complication. Vital sign monitoring by nursing staff during the procedure will continue as patient is in the special procedures unit for post procedure observation. FINDINGS: The images document guide needle placement within the right posterior pleural nodule or mass. Post biopsy images demonstrate no pneumothorax or pulmonary hemorrhage. COMPLICATIONS: None immediate. IMPRESSION: Successful CT-guided core biopsy of the right posterior pleural nodular mass Electronically Signed   By: Jerilynn Mages.  Shick M.D.   On: 04/14/2020 13:48   DG Chest Port 1 View  Result Date: 04/21/2020 CLINICAL DATA:  Status post thoracentesis. Small cell lung cancer. Pleural effusion. EXAM: PORTABLE CHEST 1 VIEW COMPARISON:  04/21/2020 FINDINGS: RIGHT-sided PowerPort tip overlies the superior vena cava. RIGHT-sided pleural effusion is significantly smaller. There is persistent RIGHT LOWER lobe opacity likely representing atelectasis and possible consolidation. There is no pneumothorax. LEFT lung is clear. IMPRESSION: Decreased RIGHT  pleural effusion. No pneumothorax. Electronically Signed   By: Nolon Nations M.D.   On: 04/21/2020 16:51   DG Chest Port 1 View  Result Date: 04/21/2020 CLINICAL DATA:  Worsening shortness of breath. EXAM: PORTABLE CHEST 1 VIEW COMPARISON:  04/14/2020 FINDINGS: Two AP portable radiographs. Right Port-A-Cath tip at superior caval/atrial junction. Midline trachea. Mild cardiomegaly. Significant enlargement of moderate right pleural effusion. No left-sided pleural fluid or pneumothorax. The right hilar mass and paratracheal adenopathy are partially obscured by progressive right mid and lower lung airspace disease. No overt congestive failure. Left lung is clear. IMPRESSION: Increased moderate right pleural effusion with worsening right-sided aeration, most likely due to atelectasis. Right hilar mass and paratracheal adenopathy are partially obscured by progressive right-sided airspace disease. Electronically Signed   By: Abigail Miyamoto M.D.   On: 04/21/2020 10:57   DG Chest Port 1 View  Result Date: 04/14/2020 CLINICAL DATA:  Suspect metastatic lung cancer, right effusion, status post thoracentesis EXAM: PORTABLE CHEST 1 VIEW COMPARISON:  04/11/2020 FINDINGS:  Improvement in the right effusion following thoracentesis. No pneumothorax. Small residual right effusion remains. Stable right hilar mass and paratracheal adenopathy. Stable left lung aeration. Trachea midline. Heart is enlarged. IMPRESSION: Negative for pneumothorax following right thoracentesis. Small right effusion remains. Known right hilar mass and right paratracheal adenopathy consistent with bronchogenic carcinoma. Electronically Signed   By: Jerilynn Mages.  Shick M.D.   On: 04/14/2020 11:20   DG Chest Port 1 View  Result Date: 03/31/2020 CLINICAL DATA:  Right pleural effusion, status post thoracentesis EXAM: PORTABLE CHEST 1 VIEW COMPARISON:  03/30/2020 FINDINGS: Interval decrease in volume of a moderate right pleural effusion, now small, with associated  atelectasis or consolidation. Redemonstrated mass of the suprahilar right lung. Cardiomegaly. IMPRESSION: 1. Interval decrease in volume of a moderate right pleural effusion, now small, with associated atelectasis or consolidation. No pneumothorax. 2. Redemonstrated mass of the suprahilar right lung. Electronically Signed   By: Eddie Candle M.D.   On: 03/31/2020 13:37   CT Angio Chest/Abd/Pel for Dissection W and/or Wo Contrast  Result Date: 03/30/2020 CLINICAL DATA:  Chest and back pain. EXAM: CT ANGIOGRAPHY CHEST, ABDOMEN AND PELVIS TECHNIQUE: Non-contrast CT of the chest was initially obtained. Multidetector CT imaging through the chest, abdomen and pelvis was performed using the standard protocol during bolus administration of intravenous contrast. Multiplanar reconstructed images and MIPs were obtained and reviewed to evaluate the vascular anatomy. CONTRAST:  168mL OMNIPAQUE IOHEXOL 350 MG/ML SOLN COMPARISON:  None. FINDINGS: CTA CHEST FINDINGS Cardiovascular: Preferential opacification of the thoracic aorta. No evidence of thoracic aortic aneurysm or dissection. Normal heart size. No pericardial effusion. Mediastinum/Nodes: The esophagus is unremarkable. 6.4 x 3.7 cm right paratracheal adenopathy is noted concerning for malignancy or metastatic disease. 1.9 cm right hilar lymph node is noted. 12 mm anterior mediastinal lymph node is noted concerning for metastatic disease. 2.8 cm right thyroid nodule is noted. Lungs/Pleura: No pneumothorax is noted. Left lung is clear. Moderate size right pleural effusion is noted with associated atelectasis of the right lower lobe. Several pleural-based masses are noted posteriorly concerning for metastatic disease. Large irregular opacity is noted posteriorly in the right upper lobe which may represent malignancy or associated postobstructive atelectasis. 12 x 9 mm nodule is noted in right middle lobe concerning for metastatic disease. Musculoskeletal: Lytic lesion is  noted in the manubrium consistent with metastatic disease. Review of the MIP images confirms the above findings. CTA ABDOMEN AND PELVIS FINDINGS VASCULAR Aorta: Normal caliber aorta without aneurysm, dissection, vasculitis or significant stenosis. Celiac: Patent without evidence of aneurysm, dissection, vasculitis or significant stenosis. SMA: Patent without evidence of aneurysm, dissection, vasculitis or significant stenosis. Renals: Both renal arteries are patent without evidence of aneurysm, dissection, vasculitis, fibromuscular dysplasia or significant stenosis. IMA: Patent without evidence of aneurysm, dissection, vasculitis or significant stenosis. Inflow: Patent without evidence of aneurysm, dissection, vasculitis or significant stenosis. Veins: No obvious venous abnormality within the limitations of this arterial phase study. Review of the MIP images confirms the above findings. NON-VASCULAR Hepatobiliary: Cholelithiasis is noted. No biliary dilatation is noted. The liver is unremarkable. Pancreas: Unremarkable. No pancreatic ductal dilatation or surrounding inflammatory changes. Spleen: Normal in size without focal abnormality. Adrenals/Urinary Tract: Adrenal glands are unremarkable. Kidneys are normal, without renal calculi, focal lesion, or hydronephrosis. Bladder is unremarkable. Stomach/Bowel: Stomach is within normal limits. Appendix appears normal. No evidence of bowel wall thickening, distention, or inflammatory changes. Lymphatic: No significant adenopathy is noted in the abdomen or pelvis. Reproductive: Mild prostatic enlargement is noted. Other: No abdominal  wall hernia or abnormality. No abdominopelvic ascites. Musculoskeletal: No acute or significant osseous findings. Review of the MIP images confirms the above findings. IMPRESSION: 1. There is no evidence of thoracic or abdominal aortic dissection or aneurysm. 2. Mediastinal adenopathy is noted concerning for malignancy or metastatic disease.  3. Moderate size right pleural effusion is noted with associated atelectasis of the right lower lobe. There appears to be several pleural based masses in the right upper lung zone concerning for metastatic disease. 4. Large irregular opacity is noted posteriorly in the right upper lobe which may represent malignancy or associated postobstructive atelectasis. 5. 12 x 9 mm nodule is noted in right middle lobe concerning for metastatic disease. 6. Lytic lesion is noted in the manubrium consistent with metastatic disease. 7. Cholelithiasis. 8. Mild prostatic enlargement. 9. 2.8 cm right thyroid nodule is noted. In the setting of significant comorbidities or limited life expectancy, no follow-up recommended. (Ref: J Am Coll Radiol. 2015 Feb;12(2): 143-50). Electronically Signed   By: Marijo Conception M.D.   On: 03/30/2020 14:10   IR IMAGING GUIDED PORT INSERTION  Result Date: 04/21/2020 INDICATION: 75 year old male with history of advanced stage right lung cancer requiring central venous access for chemotherapy. EXAM: IMPLANTED PORT A CATH PLACEMENT WITH ULTRASOUND AND FLUOROSCOPIC GUIDANCE COMPARISON:  None. MEDICATIONS: None. ANESTHESIA/SEDATION: Moderate (conscious) sedation was employed during this procedure. A total of Versed 1 mg and Fentanyl 50 mcg was administered intravenously. Moderate Sedation Time: 11 minutes. The patient's level of consciousness and vital signs were monitored continuously by radiology nursing throughout the procedure under my direct supervision. CONTRAST:  None FLUOROSCOPY TIME:  0.3 minutes (2.7 mGy) COMPLICATIONS: None immediate. PROCEDURE: The procedure, risks, benefits, and alternatives were explained to the patient. Questions regarding the procedure were encouraged and answered. The patient understands and consents to the procedure. The right neck and chest were prepped with chlorhexidine in a sterile fashion, and a sterile drape was applied covering the operative field. Maximum  barrier sterile technique with sterile gowns and gloves were used for the procedure. A timeout was performed prior to the initiation of the procedure. Ultrasound was used to examine the jugular vein which was compressible and free of internal echoes. A skin marker was used to demarcate the planned venotomy and port pocket incision sites. Local anesthesia was provided to these sites and the subcutaneous tunnel track with 1% lidocaine with 1:100,000 epinephrine. A small incision was created at the jugular access site and blunt dissection was performed of the subcutaneous tissues. Under real time ultrasound guidance, the jugular vein was accessed with a 21 ga micropuncture needle and an 0.018" wire was inserted to the superior vena cava. A 5 Fr micopuncture set was then used, through which a 0.035" Rosen wire was passed under fluoroscopic guidance into the inferior vena cava. An 8 Fr dilator was then placed over the wire. A subcutaneous port pocket was then created along the upper chest wall utilizing a combination of sharp and blunt dissection. The pocket was irrigated with sterile saline, packed with gauze, and observed for hemorrhage. A single lumen "ISP" sized power injectable port was chosen for placement. The 8 Fr catheter was tunneled from the port pocket site to the venotomy incision. The port was placed in the pocket. The external catheter was trimmed to appropriate length. The dilator was exchanged for an 8 Fr peel-away sheath under fluoroscopic guidance. The catheter was then placed through the sheath and the sheath was removed. Final catheter positioning was confirmed and  documented with a fluoroscopic spot radiograph. The port was accessed with a Huber needle, aspirated, and flushed with heparinized saline. The deep dermal layer of the port pocket incision was closed with interrupted 3-0 Vicryl suture. Dermabond was then placed over the port pocket and neck incisions. The patient tolerated the procedure  well without immediate post procedural complication. FINDINGS: After catheter placement, the tip lies within the cavoatrial junction. The catheter aspirates and flushes normally and is ready for immediate use. IMPRESSION: Successful placement of a power injectable Port-A-Cath via the right internal jugular vein. The catheter is ready for immediate use. Ruthann Cancer, MD [facility] Electronically Signed   By: Ruthann Cancer MD   On: 04/21/2020 14:20   US THORACENTESIS ASP PLEURAL SPACE W/IMG GUIDE  Result Date: 04/24/2020 INDICATION: Right-sided lung cancer. Recurrent right-sided pleural effusion. Request for or therapeutic thoracentesis. EXAM: ULTRASOUND GUIDED RIGHT THORACENTESIS MEDICATIONS: 1% plain lidocaine, 10 mL COMPLICATIONS: None immediate. PROCEDURE: An ultrasound guided thoracentesis was thoroughly discussed with the patient and questions answered. The benefits, risks, alternatives and complications were also discussed. The patient understands and wishes to proceed with the procedure. Written consent was obtained. Ultrasound was performed to localize and mark an adequate pocket of fluid in the right chest. The area was then prepped and draped in the normal sterile fashion. 1% Lidocaine was used for local anesthesia. Under ultrasound guidance a 6 Fr Safe-T-Centesis catheter was introduced. Thoracentesis was performed. The catheter was removed and a dressing applied. FINDINGS: A total of approximately 1.7 L of blood-tinged fluid was removed. IMPRESSION: Successful ultrasound guided right thoracentesis yielding 1.7 L of pleural fluid. Read by: Ascencion Dike PA-C Electronically Signed   By: Ruthann Cancer MD   On: 04/21/2020 16:49   US THORACENTESIS ASP PLEURAL SPACE W/IMG GUIDE  Result Date: 04/14/2020 INDICATION: Patient with suspected right lung cancer and recurrent right pleural effusions presents today for therapeutic thoracentesis prior to  CT biopsy procedure. EXAM: ULTRASOUND GUIDED THORACENTESIS MEDICATIONS: 1% lidocaine 10 COMPLICATIONS: None immediate. PROCEDURE: An ultrasound guided thoracentesis was thoroughly discussed with the patient and questions answered. The benefits, risks, alternatives and complications were also discussed. The patient understands and wishes to proceed with the procedure. Written consent was obtained. Ultrasound was performed to localize and mark an adequate pocket of fluid in the right chest. The area was then prepped and draped in the normal sterile fashion. 1% Lidocaine was used for local anesthesia. Under ultrasound guidance a 6 Fr Safe-T-Centesis catheter was introduced. Thoracentesis was performed. The catheter was removed and a dressing applied. FINDINGS: A total of approximately 1.6 L of amber-colored fluid was removed. Samples were sent to the laboratory as requested by the clinical team. IMPRESSION: Successful ultrasound guided right thoracentesis yielding 1.6 L of pleural fluid. Read by: Soyla Dryer, NP Electronically Signed   By: Jerilynn Mages.  Shick M.D.   On: 04/14/2020 11:21   US THORACENTESIS ASP PLEURAL SPACE W/IMG GUIDE  Result Date: 03/31/2020 INDICATION: Patient with newly discovered pleural-based masses and right pleural effusion presents today for therapeutic and diagnostic thoracentesis. EXAM: ULTRASOUND GUIDED THORACENTESIS MEDICATIONS: 1% lidocaine 10 mL COMPLICATIONS: None immediate. PROCEDURE: An ultrasound guided thoracentesis was thoroughly discussed with the patient and questions answered. The benefits, risks, alternatives and complications were also discussed. The patient understands and wishes to proceed with the procedure. Written consent was obtained. Ultrasound was performed to localize and mark an adequate pocket of fluid in the right chest. The area was then prepped and  draped in the normal sterile fashion. 1% Lidocaine was used for local anesthesia. Under ultrasound guidance a 6 Fr  Safe-T-Centesis catheter was introduced. Thoracentesis was performed. The catheter was removed and a dressing applied. FINDINGS: A total of approximately 1.3 L of amber-colored fluid was removed. Samples were sent to the laboratory as requested by the clinical team. IMPRESSION: Successful ultrasound guided right thoracentesis yielding 1.3 L of pleural fluid. Read by: Soyla Dryer, NP Electronically Signed   By: Miachel Roux M.D.   On: 03/31/2020 14:01     Assessment and plan- Patient is a 75 y.o. 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  Second biopsy confirms that this is a small cell lung cancer and given that he has pleural and bone mets this would be stage IV disease.  Plan is to proceed with [place] etoposide Tecentriq palliative chemo immunotherapy starting today.  Treatment will be given IV every 3 weeks for 4 cycles.  I plan to get repeat scans after 2 and 4 cycles.  If scans show good response to treatment he will continue maintenance Tecentriq until progression or toxicity.  Discussed risks and benefits of chemotherapy including all but not limited to nausea, vomiting, low blood counts, risk of infections and hospitalizations.  Risk of autoimmune side effects including autoimmune colitis, pneumonitis, thyroiditis and need to monitor kidney and liver functions with immunotherapy.  Treatment will be given with a palliative intent.  Patient understands and agrees to proceed as planned  Malignant right pleural effusion: This was drained 2 days ago.  Hopefully with the onset of chemotherapy if his tumor response, his effusions will accumulate less frequently over time.  Patient will let us know when his shortness of breath is worsening and we will plan to get another thoracentesis at that time and potentially consider a  Pleurx if need be.  I also sent him a prescription for as needed morphine for his shortness of breath.  He also has home oxygen as needed and reports that this has been helping him  He will be seen in 10 days time to see how he tolerated chemotherapy and I will see him back in 3 weeks for cycle 2  He will be receiving on for Neulasta with cycle 1 and likely to receive growth factor support at home after cycle 2  Discussed the findings of bone metastases on PET scan.  Discussed the need for bisphosphonate to reduce the risk of future skeletal fractures.  Discussed risks and benefits of Zometa and Xgeva including all but not limited to fatigue, hypocalcemia and possible risk of osteonecrosis of the jaw.  Discussed the need for dental clearance prior to starting bisphosphonates.  Patient understands and agrees to proceed as planned will await dental clearance and potentially start Xgeva in 3 weeks   Visit Diagnosis 1. Encounter for antineoplastic chemotherapy   2. Encounter for antineoplastic immunotherapy   3. Goals of care, counseling/discussion   4. Small cell lung cancer, right ([place])      Dr. Randa Evens, MD, MPH [facility] [phone] 04/24/2020 12:45 PM

## 2020-04-24 NOTE — Progress Notes (Signed)
  Oncology Nurse Navigator Documentation  Navigator Location: [facility] (04/24/20 1000)   )Navigator Encounter Type: Follow-up Appt;Treatment (04/24/20 1000)     Confirmed Diagnosis Date: 04/18/20 (04/24/20 1000)             Treatment Initiated Date: 04/24/20 (04/24/20 1000) Patient Visit Type: MedOnc (04/24/20 1000) Treatment Phase: First Chemo Tx (04/24/20 1000) Barriers/Navigation Needs: Coordination of Care (04/24/20 1000)   Interventions: Coordination of Care (04/24/20 1000)   Coordination of Care: Appts;Chemo (04/24/20 1000)        Acuity: Level 2-Minimal Needs (1-2 Barriers Identified) (04/24/20 1000)    met with patient during follow up visit with Dr. Janese Banks to discuss starting chemotherapy treatment today. All questions answered during visit. Pt given resources regarding diagnosis and supportive services available. Informed that will be given follow up appts prior to leaving today. Instructed to call with any further questions or needs. Pt verbalized understanding. Nothing further needed at this time.     Time Spent with Patient: 60 (04/24/20 1000)

## 2020-04-25 ENCOUNTER — Inpatient Hospital Stay: Payer: Managed Care, Other (non HMO) | Attending: Oncology

## 2020-04-25 ENCOUNTER — Telehealth: Payer: Self-pay

## 2020-04-25 VITALS — BP 144/76 | HR 78 | Temp 97.1°F

## 2020-04-25 DIAGNOSIS — D701 Agranulocytosis secondary to cancer chemotherapy: Secondary | ICD-10-CM | POA: Diagnosis not present

## 2020-04-25 DIAGNOSIS — D6959 Other secondary thrombocytopenia: Secondary | ICD-10-CM | POA: Diagnosis not present

## 2020-04-25 DIAGNOSIS — Z5112 Encounter for antineoplastic immunotherapy: Secondary | ICD-10-CM | POA: Diagnosis present

## 2020-04-25 DIAGNOSIS — Z5111 Encounter for antineoplastic chemotherapy: Secondary | ICD-10-CM | POA: Insufficient documentation

## 2020-04-25 DIAGNOSIS — T451X5A Adverse effect of antineoplastic and immunosuppressive drugs, initial encounter: Secondary | ICD-10-CM | POA: Insufficient documentation

## 2020-04-25 DIAGNOSIS — F17210 Nicotine dependence, cigarettes, uncomplicated: Secondary | ICD-10-CM | POA: Diagnosis not present

## 2020-04-25 DIAGNOSIS — Z79899 Other long term (current) drug therapy: Secondary | ICD-10-CM | POA: Diagnosis not present

## 2020-04-25 DIAGNOSIS — C3411 Malignant neoplasm of upper lobe, right bronchus or lung: Secondary | ICD-10-CM | POA: Diagnosis present

## 2020-04-25 DIAGNOSIS — C7951 Secondary malignant neoplasm of bone: Secondary | ICD-10-CM | POA: Diagnosis not present

## 2020-04-25 DIAGNOSIS — Z5189 Encounter for other specified aftercare: Secondary | ICD-10-CM | POA: Diagnosis not present

## 2020-04-25 DIAGNOSIS — C3491 Malignant neoplasm of unspecified part of right bronchus or lung: Secondary | ICD-10-CM

## 2020-04-25 LAB — T4: T4, Total: 5.3 ug/dL (ref 4.5–12.0)

## 2020-04-25 LAB — HEPATITIS B SURFACE ANTIBODY, QUANTITATIVE: Hep B S AB Quant (Post): <3.1 m[IU]/mL — ABNORMAL LOW (ref >9.9)

## 2020-04-25 MED ORDER — HEPARIN SOD (PORK) LOCK FLUSH 100 UNIT/ML IV SOLN
500.0000 [IU] | Freq: Once | INTRAVENOUS | Status: AC | PRN
  Administered 2020-04-25: 500 [IU]
  Filled 2020-04-25: qty 5

## 2020-04-25 MED ORDER — SODIUM CHLORIDE 0.9 % IV SOLN
100.0000 mg/m2 | Freq: Once | INTRAVENOUS | Status: AC
  Administered 2020-04-25: 280 mg via INTRAVENOUS
  Filled 2020-04-25: qty 14

## 2020-04-25 MED ORDER — SODIUM CHLORIDE 0.9 % IV SOLN
10.0000 mg | Freq: Once | INTRAVENOUS | Status: AC
  Administered 2020-04-25: 10 mg via INTRAVENOUS
  Filled 2020-04-25: qty 10

## 2020-04-25 MED ORDER — HEPARIN SOD (PORK) LOCK FLUSH 100 UNIT/ML IV SOLN
INTRAVENOUS | Status: AC
  Filled 2020-04-25: qty 5

## 2020-04-25 MED ORDER — SODIUM CHLORIDE 0.9 % IV SOLN
Freq: Once | INTRAVENOUS | Status: AC
  Filled 2020-04-25: qty 250

## 2020-04-25 NOTE — Telephone Encounter (Signed)
T/C to pt for follow up after receiving first infusion yesterday.  No answer and voice mail box has not been set up yet.  Pt was here earlier today receiving 2nd dose of Etoposide.

## 2020-04-26 ENCOUNTER — Inpatient Hospital Stay: Payer: Managed Care, Other (non HMO)

## 2020-04-26 VITALS — BP 132/72 | HR 76 | Temp 98.0°F

## 2020-04-26 DIAGNOSIS — Z5112 Encounter for antineoplastic immunotherapy: Secondary | ICD-10-CM | POA: Diagnosis not present

## 2020-04-26 DIAGNOSIS — C3491 Malignant neoplasm of unspecified part of right bronchus or lung: Secondary | ICD-10-CM

## 2020-04-26 MED ORDER — HEPARIN SOD (PORK) LOCK FLUSH 100 UNIT/ML IV SOLN
500.0000 [IU] | Freq: Once | INTRAVENOUS | Status: AC | PRN
  Administered 2020-04-26: 500 [IU]
  Filled 2020-04-26: qty 5

## 2020-04-26 MED ORDER — PEGFILGRASTIM 6 MG/0.6ML ~~LOC~~ PSKT
6.0000 mg | PREFILLED_SYRINGE | Freq: Once | SUBCUTANEOUS | Status: AC
  Administered 2020-04-26: 6 mg via SUBCUTANEOUS
  Filled 2020-04-26: qty 0.6

## 2020-04-26 MED ORDER — SODIUM CHLORIDE 0.9 % IV SOLN
10.0000 mg | Freq: Once | INTRAVENOUS | Status: AC
  Administered 2020-04-26: 10 mg via INTRAVENOUS
  Filled 2020-04-26: qty 10

## 2020-04-26 MED ORDER — SODIUM CHLORIDE 0.9 % IV SOLN
100.0000 mg/m2 | Freq: Once | INTRAVENOUS | Status: AC
  Administered 2020-04-26: 280 mg via INTRAVENOUS
  Filled 2020-04-26: qty 14

## 2020-04-26 MED ORDER — HEPARIN SOD (PORK) LOCK FLUSH 100 UNIT/ML IV SOLN
INTRAVENOUS | Status: AC
  Filled 2020-04-26: qty 5

## 2020-04-26 MED ORDER — SODIUM CHLORIDE 0.9 % IV SOLN
Freq: Once | INTRAVENOUS | Status: AC
  Filled 2020-04-26: qty 250

## 2020-04-27 ENCOUNTER — Other Ambulatory Visit: Payer: Self-pay | Admitting: Nurse Practitioner

## 2020-04-27 MED ORDER — MORPHINE SULFATE (CONCENTRATE) 5 MG/0.25ML PO SOLN: 5.0000 mg | ORAL | 0 refills | Status: DC | PRN

## 2020-04-28 ENCOUNTER — Other Ambulatory Visit: Payer: Self-pay | Admitting: Nurse Practitioner

## 2020-05-02 ENCOUNTER — Telehealth: Payer: Self-pay | Admitting: *Deleted

## 2020-05-02 NOTE — Telephone Encounter (Signed)
Pt is scheduled to follow up with his dentist, Dr. Carman Ching, on 05/03/20. Dental clearance will be faxed to our office after his visit.

## 2020-05-02 NOTE — Telephone Encounter (Signed)
Spoke with David Johnson at Dr. Marilu Favre office regarding dental clearance. Per David Johnson, pt will need to be evaluated prior to giving clearance. They will set him up with an appt to see Dr. Carman Ching for evaluation. David Johnson stated will call back to let me know when he is scheduled for his dental appt.

## 2020-05-04 ENCOUNTER — Inpatient Hospital Stay: Payer: Managed Care, Other (non HMO)

## 2020-05-04 ENCOUNTER — Inpatient Hospital Stay (HOSPITAL_BASED_OUTPATIENT_CLINIC_OR_DEPARTMENT_OTHER): Payer: Managed Care, Other (non HMO) | Admitting: Nurse Practitioner

## 2020-05-04 VITALS — BP 115/60 | HR 70 | Temp 98.0°F | Resp 22

## 2020-05-04 DIAGNOSIS — D701 Agranulocytosis secondary to cancer chemotherapy: Secondary | ICD-10-CM | POA: Diagnosis not present

## 2020-05-04 DIAGNOSIS — D6959 Other secondary thrombocytopenia: Secondary | ICD-10-CM | POA: Diagnosis not present

## 2020-05-04 DIAGNOSIS — J90 Pleural effusion, not elsewhere classified: Secondary | ICD-10-CM | POA: Diagnosis not present

## 2020-05-04 DIAGNOSIS — C3491 Malignant neoplasm of unspecified part of right bronchus or lung: Secondary | ICD-10-CM

## 2020-05-04 DIAGNOSIS — Z95828 Presence of other vascular implants and grafts: Secondary | ICD-10-CM

## 2020-05-04 DIAGNOSIS — R634 Abnormal weight loss: Secondary | ICD-10-CM

## 2020-05-04 DIAGNOSIS — T451X5A Adverse effect of antineoplastic and immunosuppressive drugs, initial encounter: Secondary | ICD-10-CM

## 2020-05-04 DIAGNOSIS — Z5112 Encounter for antineoplastic immunotherapy: Secondary | ICD-10-CM | POA: Diagnosis not present

## 2020-05-04 LAB — CBC WITH DIFFERENTIAL/PLATELET
Abs Immature Granulocytes: 0.15 10*3/uL — ABNORMAL HIGH (ref 0.00–0.07)
Basophils Absolute: 0.1 10*3/uL (ref 0.0–0.1)
Basophils Relative: 3 %
Eosinophils Absolute: 0 10*3/uL (ref 0.0–0.5)
Eosinophils Relative: 1 %
HCT: 48.7 % (ref 39.0–52.0)
Hemoglobin: 16.6 g/dL (ref 13.0–17.0)
Immature Granulocytes: 5 %
Lymphocytes Relative: 46 %
Lymphs Abs: 1.3 10*3/uL (ref 0.7–4.0)
MCH: 31.1 pg (ref 26.0–34.0)
MCHC: 34.1 g/dL (ref 30.0–36.0)
MCV: 91.4 fL (ref 80.0–100.0)
Monocytes Absolute: 0.4 10*3/uL (ref 0.1–1.0)
Monocytes Relative: 14 %
Neutro Abs: 0.9 10*3/uL — ABNORMAL LOW (ref 1.7–7.7)
Neutrophils Relative %: 31 %
Platelets: 60 10*3/uL — ABNORMAL LOW (ref 150–400)
RBC: 5.33 MIL/uL (ref 4.22–5.81)
RDW: 13 % (ref 11.5–15.5)
Smear Review: NORMAL
WBC: 2.8 10*3/uL — ABNORMAL LOW (ref 4.0–10.5)
nRBC: 0 % (ref 0.0–0.2)

## 2020-05-04 LAB — COMPREHENSIVE METABOLIC PANEL
ALT: 66 U/L — ABNORMAL HIGH (ref 0–44)
AST: 30 U/L (ref 15–41)
Albumin: 3.3 g/dL — ABNORMAL LOW (ref 3.5–5.0)
Alkaline Phosphatase: 99 U/L (ref 38–126)
Anion gap: 7 (ref 5–15)
BUN: 9 mg/dL (ref 8–23)
CO2: 25 mmol/L (ref 22–32)
Calcium: 8.5 mg/dL — ABNORMAL LOW (ref 8.9–10.3)
Chloride: 106 mmol/L (ref 98–111)
Creatinine, Ser: 0.74 mg/dL (ref 0.61–1.24)
GFR, Estimated: >60 mL/min (ref >60)
Glucose, Bld: 143 mg/dL — ABNORMAL HIGH (ref 70–99)
Potassium: 3.8 mmol/L (ref 3.5–5.1)
Sodium: 138 mmol/L (ref 135–145)
Total Bilirubin: 0.8 mg/dL (ref 0.3–1.2)
Total Protein: 6.1 g/dL — ABNORMAL LOW (ref 6.5–8.1)

## 2020-05-04 MED ORDER — SODIUM CHLORIDE 0.9% FLUSH
10.0000 mL | Freq: Once | INTRAVENOUS | Status: AC
  Administered 2020-05-04: 10 mL via INTRAVENOUS
  Filled 2020-05-04: qty 10

## 2020-05-04 MED ORDER — HEPARIN SOD (PORK) LOCK FLUSH 100 UNIT/ML IV SOLN
500.0000 [IU] | Freq: Once | INTRAVENOUS | Status: AC
  Administered 2020-05-04: 500 [IU] via INTRAVENOUS
  Filled 2020-05-04: qty 5

## 2020-05-04 NOTE — Progress Notes (Signed)
Symptom Management [place]  [phone] [phone]  Patient Care Team: [place] Pink, MD as PCP - General (Family Medicine)   Name of the patient: David Johnson  [id number]  05-Sep-1945   Date of visit: 05/04/20  Diagnosis- Extensive stage small cell lung cancer  Chief complaint/ Reason for visit- Follow up post chemo & shortness of breath  Heme/Onc history:  Oncology History  Small cell lung cancer, right ([place])  04/18/2020 Initial Diagnosis   Small cell lung cancer, right ([place])   04/18/2020 Cancer Staging   Staging form: Lung, AJCC 8th Edition - Clinical: Stage IVB (cT3, cN2, pM1c) - Signed by Sindy Guadeloupe, MD on 04/18/2020   04/24/2020 -  Chemotherapy    Patient is on Treatment Plan: LUNG SCLC CARBOPLATIN + ETOPOSIDE + ATEZOLIZUMAB INDUCTION Q21D / ATEZOLIZUMAB MAINTENANCE Q21D        Interval history- Patient with extensive stage small cell lung cancer presents to Symptom management clinic for follow up for shortness of breath. He initiated chemotherapy on 04/24/20. Required thoracentesis on 2/28. He has used home oxygen intermittently. Hasn't noticed a difference with morphine and prefers to minimize medication if possible. Tolerated chemo well without significant side effects. No dizziness or weakness. No fevers or illness. Appetite is reduced and he questions weight loss. No chest pain. Shortness of breath improves with rest. No nausea, vomiting, constipation, or diarrhea. No urinary complaints. He continues to work as an Therapist, sports.   Review of systems- Review of Systems  Constitutional: Positive for weight loss. Negative for chills, fever and malaise/fatigue.  HENT: Negative for hearing loss, nosebleeds, sore throat and tinnitus.   Eyes: Negative for blurred vision and double vision.  Respiratory: Positive for shortness of breath. Negative for cough, hemoptysis and wheezing.   Cardiovascular: Negative for  chest pain, palpitations and leg swelling.  Gastrointestinal: Negative for abdominal pain, blood in stool, constipation, diarrhea, melena, nausea and vomiting.  Genitourinary: Negative for dysuria and urgency.  Musculoskeletal: Negative for back pain, falls, joint pain and myalgias.  Skin: Negative for itching and rash.  Neurological: Negative for dizziness, tingling, sensory change, loss of consciousness, weakness and headaches.  Endo/Heme/Allergies: Negative for environmental allergies. Does not bruise/bleed easily.  Psychiatric/Behavioral: Negative for depression. The patient is not nervous/anxious and does not have insomnia.       Allergies  Allergen Reactions  . Cephalexin Rash    Past Medical History:  Diagnosis Date  . Adenomatous polyps   . Barrett's esophagus   . Colon polyp   . COPD (chronic obstructive pulmonary disease) ([place])    uses inhaler, "i may have COPD"  . Dyspnea   . Eye injury   . GERD (gastroesophageal reflux disease)   . History of gonorrhea   . History of pneumonia   . Obesity   . Pneumonia   . Reflux esophagitis   . Sleep apnea     Past Surgical History:  Procedure Laterality Date  . CATARACT EXTRACTION Left   . COLONOSCOPY    . COLONOSCOPY WITH PROPOFOL N/A 05/19/2015   Procedure: COLONOSCOPY WITH PROPOFOL;  Surgeon: [name], MD;  Location: [facility] ENDOSCOPY;  Service: Endoscopy;  Laterality: N/A;  . COLONOSCOPY WITH PROPOFOL N/A 09/11/2018   Procedure: COLONOSCOPY WITH PROPOFOL;  Surgeon: [name], MD;  Location: [facility] ENDOSCOPY;  Service: Endoscopy;  Laterality: N/A;  . ESOPHAGOGASTRODUODENOSCOPY (EGD) WITH PROPOFOL N/A 05/19/2015   Procedure: ESOPHAGOGASTRODUODENOSCOPY (EGD) WITH PROPOFOL;  Surgeon: [name], MD;  Location: [facility] ENDOSCOPY;  Service: Endoscopy;  Laterality: N/A;  . ESOPHAGOGASTRODUODENOSCOPY (EGD) WITH PROPOFOL N/A 10/11/2016   Procedure: ESOPHAGOGASTRODUODENOSCOPY (EGD) WITH PROPOFOL;  Surgeon: [name], MD;  Location: [facility] ENDOSCOPY;  Service: Endoscopy;  Laterality: N/A;  . ESOPHAGOGASTRODUODENOSCOPY (EGD) WITH PROPOFOL N/A 01/01/2018   Procedure: ESOPHAGOGASTRODUODENOSCOPY (EGD) WITH PROPOFOL;  Surgeon: [name], MD;  Location: [facility] ENDOSCOPY;  Service: Endoscopy;  Laterality: N/A;  . ESOPHAGOGASTRODUODENOSCOPY (EGD) WITH PROPOFOL N/A 09/11/2018   Procedure: ESOPHAGOGASTRODUODENOSCOPY (EGD) WITH PROPOFOL;  Surgeon: [name], MD;  Location: [facility] ENDOSCOPY;  Service: Endoscopy;  Laterality: N/A;  . EYE SURGERY    . IR IMAGING GUIDED PORT INSERTION  04/21/2020    Social History   Socioeconomic History  . Marital status: Married    Spouse name: Not on file  . Number of children: Not on file  . Years of education: Not on file  . Highest education level: Not on file  Occupational History  . Not on file  Tobacco Use  . Smoking status: Current Every Day Smoker    Packs/day: 1.50    Years: 35.00    Pack years: 52.50    Types: Cigarettes, Cigars    Last attempt to quit: 03/04/2015    Years since quitting: 5.1  . Smokeless tobacco: Never Used  Vaping Use  . Vaping Use: Every day  . Substances: Flavoring  Substance and Sexual Activity  . Alcohol use: Yes    Alcohol/week: 8.0 standard drinks    Types: 8 Cans of beer per week    Comment: Occassionally on weekends   . Drug use: No  . Sexual activity: Not on file  Other Topics Concern  . Not on file  Social History Narrative  . Not on file   Social Determinants of Health   Financial Resource Strain: Not on file  Food Insecurity: Not on file  Transportation Needs: Not on file  Physical Activity: Not on file  Stress: Not on file  Social Connections: Not on file  Intimate Partner Violence: Not on file    Family History  Problem Relation Age of Onset  . Emphysema Mother      Current Outpatient Medications:  .  albuterol (PROVENTIL HFA;VENTOLIN HFA) 108 (90 Base) MCG/ACT inhaler, 2 puffs q.i.d.  p.r.n. short of breath, wheezing, or cough, Disp: , Rfl:  .  lidocaine-prilocaine (EMLA) cream, Apply to affected area once, Disp: 30 g, Rfl: 3 .  LORazepam (ATIVAN) 0.5 MG tablet, Take 1 tablet (0.5 mg total) by mouth 3 (three) times daily as needed (Nausea or vomiting). (Patient not taking: No sig reported), Disp: 30 tablet, Rfl: 0 .  Morphine Sulfate, Concentrate, 5 MG/0.25ML SOLN, Take 5 mg by mouth every 4 (four) hours as needed. For dyspnea (Patient not taking: Reported on 05/04/2020), Disp: 30 mL, Rfl: 0 .  ondansetron (ZOFRAN) 8 MG tablet, Take 1 tablet (8 mg total) by mouth 2 (two) times daily as needed for refractory nausea / vomiting. Start on day 3 after carboplatin chemo. (Patient not taking: No sig reported), Disp: 30 tablet, Rfl: 1 .  prochlorperazine (COMPAZINE) 10 MG tablet, Take 1 tablet (10 mg total) by mouth every 6 (six) hours as needed (Nausea or vomiting). (Patient not taking: No sig reported), Disp: 30 tablet, Rfl: 1  Physical exam:  Vitals:   05/04/20 0919  BP: 115/60  Pulse: 70  Resp: (!) 22  Temp: 98 F (36.7 C)  TempSrc: Tympanic  SpO2: 98%   Physical Exam  Constitutional:      General: He is not in acute distress.    Appearance: He is well-developed.  HENT:     Head: Normocephalic and atraumatic.     Nose: Nose normal.     Mouth/Throat:     Pharynx: No oropharyngeal exudate.  Eyes:     General: No scleral icterus.    Conjunctiva/sclera: Conjunctivae normal.  Cardiovascular:     Rate and Rhythm: Normal rate and regular rhythm.  Pulmonary:     Effort: Pulmonary effort is normal.     Breath sounds: Decreased breath sounds present. No wheezing.  Abdominal:     General: There is no distension.     Palpations: Abdomen is soft.     Tenderness: There is no abdominal tenderness.  Musculoskeletal:     Comments: Ambulates with cane  Skin:    General: Skin is warm and dry.  Neurological:     Mental Status: He is alert and oriented to person, place, and time.   Psychiatric:        Mood and Affect: Mood normal.        Behavior: Behavior normal.      CMP Latest Ref Rng & Units 05/04/2020  Glucose 70 - 99 mg/dL 143(H)  BUN 8 - 23 mg/dL 9  Creatinine 0.61 - 1.24 mg/dL 0.74  Sodium 135 - 145 mmol/L 138  Potassium 3.5 - 5.1 mmol/L 3.8  Chloride 98 - 111 mmol/L 106  CO2 22 - 32 mmol/L 25  Calcium 8.9 - 10.3 mg/dL 8.5(L)  Total Protein 6.5 - 8.1 g/dL 6.1(L)  Total Bilirubin 0.3 - 1.2 mg/dL 0.8  Alkaline Phos 38 - 126 U/L 99  AST 15 - 41 U/L 30  ALT 0 - 44 U/L 66(H)   CBC Latest Ref Rng & Units 05/04/2020  WBC 4.0 - 10.5 K/uL 2.8(L)  Hemoglobin 13.0 - 17.0 g/dL 16.6  Hematocrit 39.0 - 52.0 % 48.7  Platelets 150 - 400 K/uL 60(L)    No images are attached to the encounter.  DG Chest 1 View  Result Date: 04/21/2020 CLINICAL DATA:  Shortness of breath, pleural effusion. EXAM: CHEST  1 VIEW COMPARISON:  Chest radiograph done earlier the same day and CT chest 03/30/2020. FINDINGS: Single lateral view of the chest shows a large pleural effusion, shown to be on the right on the frontal view performed earlier the same day. Associated collapse/consolidation in the adjacent right lung. No left pleural effusion. IMPRESSION: Large right pleural effusion with associated collapse/consolidation in the adjacent lung. Electronically Signed   By: Lorin Picket M.D.   On: 04/21/2020 11:41   NM PET Image Initial (PI) Skull Base To Thigh  Result Date: 04/11/2020 CLINICAL DATA:  Initial treatment strategy for right middle lobe lung nodule and mediastinal adenopathy. Abnormal opacity in the right upper lobe, possible mass. EXAM: NUCLEAR MEDICINE PET SKULL BASE TO THIGH TECHNIQUE: 16.1 mCi F-18 FDG was injected intravenously. Full-ring PET imaging was performed from the skull base to thigh after the radiotracer. CT data was obtained and used for attenuation correction and anatomic localization. Fasting blood glucose: One hundred twenty-eight mg/dl COMPARISON:  Chest CT  03/30/2020 FINDINGS: Mediastinal blood pool activity: SUV max 3.0 Liver activity: SUV max NA NECK: Diffuse thyroid activity likely from thyroiditis. Mildly prominent right thyroid lobe. Maximum SUV of the right thyroid lobe is 10.2 and the left thyroid lobe 7.3. No definite hypermetabolic activity in the vicinity of the small left temporal bone lesion shown on recent MRI.  Incidental CT findings: None CHEST: Abnormal bandlike lesion in the right upper lobe measuring about 6.8 by 2.4 cm, maximum SUV 10.9, high suspicion for malignancy. Large right pleural effusion with hypermetabolic rind of pleural tissue measuring up to about 1.4 cm in thickness on image 93 series 3, maximum SUV of this rind 13.8, compatible with malignant pleural effusion. Conglomerate right paratracheal adenopathy measuring about 3.8 by 5.9 cm on image 106 series 3, maximum SUV 17.1, compatible with malignancy. A right hilar node measuring 1.5 cm in short axis on image 119 of series 3 has a maximum SUV of 10.0, compatible with malignancy. Hypermetabolic lymph nodes are present in the right pericardial space. One of several hypermetabolic lymph nodes measures 1.4 cm in short axis on image 147 of series 3 with maximum SUV 9.0 1.3 by 1.0 cm right middle lobe nodule with adjacent mild nodularity noted just below the minor fissure, maximum SUV 3.7, compatible with malignancy. Hypermetabolic activity along the collapsed right lower lobe could be within the lower lobe for along the visceral pleural margin, measuring about 1.2 by 1.8 cm on image 149 series 3 with maximum SUV 8.3. Incidental CT findings: Large malignant right pleural effusion. Mild cardiomegaly. Mild atherosclerotic calcification of the aortic arch. ABDOMEN/PELVIS: I do not see a discrete adrenal mass but there is some faintly accentuated activity along the left adrenal gland with maximum SUV 7.0. Speckled or noisy PET signal in the abdomen. Incidental CT findings: Cholelithiasis mild  abdominal aortic atherosclerosis. SKELETON: There are few scattered skeletal metastatic lesions. This includes a lytic lesion of the sternal manubrium measuring approximately 2.9 by 1.8 cm on image 91 of series 3, maximum SUV 7.6. 1.4 by 1.1 cm lytic lesion left iliac crest on image 237 series 3, maximum SUV 8.1. A small hypermetabolic lesion in the right femoral neck has a maximum SUV of 9.7 but is poorly delineated on the CT data. Speckled/noisy signal along the spine without well-defined metastatic involvement of the spine. Activity along the left proximal humerus is thought to probably be in the joint rather than in the humeral head, and accordingly probably physiologic. There is a small focus of hypermetabolic activity in the right posterior iliac bone without CT correlate, maximum SUV 7.3. There is a hypermetabolic lesion in the left mid humeral shaft centrally, maximum SUV 6.5. Incidental CT findings: Thoracic spondylosis. IMPRESSION: 1. Hypermetabolic right upper lobe mass with a few scattered osseous metastatic lesions including the sternal manubrium, bony pelvis, left mid humerus, and right proximal femur. Large conglomerate right paratracheal adenopathy with hypermetabolic right hilar adenopathy, a large malignant right pleural effusion with hypermetabolic pleural rind, and hypermetabolic pericardial lymph nodes. 2. The 1.3 by 1.0 cm right middle lobe pulmonary nodule is weakly hypermetabolic and probably a metastatic lesion. 3. Faintly accentuated activity in the left adrenal gland without a well-defined mass, indeterminate. 4. 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 Somewhat prominent right thyroid lobe compared to the left. 5. Other imaging findings of potential clinical significance: Aortic Atherosclerosis (ICD10-I70.0). Mild cardiomegaly. Cholelithiasis. Electronically Signed   By: Van Clines M.D.   On: 04/11/2020 14:13   CT BIOPSY  Result Date: 04/14/2020 INDICATION: Imaging  findings concerning for lung cancer with pleural involvement EXAM: CT-GUIDED BIOPSY RIGHT PLEURAL NODULAR MASS MEDICATIONS: 1% LIDOCAINE LOCAL ANESTHESIA/SEDATION: 0.5 mg IV Versed; 50 mcg IV Fentanyl Moderate Sedation Time:  10 MINUTES The patient was continuously monitored during the procedure by the interventional radiology nurse under my direct supervision. PROCEDURE: The procedure, risks, benefits,  and alternatives were explained to the patient. Questions regarding the procedure were encouraged and answered. The patient understands and consents to the procedure. Previous imaging reviewed. Patient position prone. Noncontrast localization CT performed. The posteromedial pleural nodular mass was localized and marked for a lateral posterior approach. Under sterile conditions and local anesthesia, CT guidance utilized to advance a 17 gauge 6.8 cm access needle to the right pleural nodular mass. Needle position confirmed with CT. 18 gauge core biopsies obtained. Samples were intact and non fragmented. These were placed in formalin. Postprocedure imaging demonstrates no pneumothorax or pulmonary hemorrhage. Patient tolerated the procedure well without complication. Vital sign monitoring by nursing staff during the procedure will continue as patient is in the special procedures unit for post procedure observation. FINDINGS: The images document guide needle placement within the right posterior pleural nodule or mass. Post biopsy images demonstrate no pneumothorax or pulmonary hemorrhage. COMPLICATIONS: None immediate. IMPRESSION: Successful CT-guided core biopsy of the right posterior pleural nodular mass Electronically Signed   By: Jerilynn Mages.  Shick M.D.   On: 04/14/2020 13:48   DG Chest Port 1 View  Result Date: 04/21/2020 CLINICAL DATA:  Status post thoracentesis. Small cell lung cancer. Pleural effusion. EXAM: PORTABLE CHEST 1 VIEW COMPARISON:  04/21/2020 FINDINGS: RIGHT-sided PowerPort tip overlies the superior vena  cava. RIGHT-sided pleural effusion is significantly smaller. There is persistent RIGHT LOWER lobe opacity likely representing atelectasis and possible consolidation. There is no pneumothorax. LEFT lung is clear. IMPRESSION: Decreased RIGHT pleural effusion. No pneumothorax. Electronically Signed   By: Nolon Nations M.D.   On: 04/21/2020 16:51   DG Chest Port 1 View  Result Date: 04/21/2020 CLINICAL DATA:  Worsening shortness of breath. EXAM: PORTABLE CHEST 1 VIEW COMPARISON:  04/14/2020 FINDINGS: Two AP portable radiographs. Right Port-A-Cath tip at superior caval/atrial junction. Midline trachea. Mild cardiomegaly. Significant enlargement of moderate right pleural effusion. No left-sided pleural fluid or pneumothorax. The right hilar mass and paratracheal adenopathy are partially obscured by progressive right mid and lower lung airspace disease. No overt congestive failure. Left lung is clear. IMPRESSION: Increased moderate right pleural effusion with worsening right-sided aeration, most likely due to atelectasis. Right hilar mass and paratracheal adenopathy are partially obscured by progressive right-sided airspace disease. Electronically Signed   By: Abigail Miyamoto M.D.   On: 04/21/2020 10:57   DG Chest Port 1 View  Result Date: 04/14/2020 CLINICAL DATA:  Suspect metastatic lung cancer, right effusion, status post thoracentesis EXAM: PORTABLE CHEST 1 VIEW COMPARISON:  04/11/2020 FINDINGS: Improvement in the right effusion following thoracentesis. No pneumothorax. Small residual right effusion remains. Stable right hilar mass and paratracheal adenopathy. Stable left lung aeration. Trachea midline. Heart is enlarged. IMPRESSION: Negative for pneumothorax following right thoracentesis. Small right effusion remains. Known right hilar mass and right paratracheal adenopathy consistent with bronchogenic carcinoma. Electronically Signed   By: Jerilynn Mages.  Shick M.D.   On: 04/14/2020 11:20   IR IMAGING GUIDED PORT  INSERTION  Result Date: 04/21/2020 INDICATION: 75 year old male with history of advanced stage right lung cancer requiring central venous access for chemotherapy. EXAM: IMPLANTED PORT A CATH PLACEMENT WITH ULTRASOUND AND FLUOROSCOPIC GUIDANCE COMPARISON:  None. MEDICATIONS: None. ANESTHESIA/SEDATION: Moderate (conscious) sedation was employed during this procedure. A total of Versed 1 mg and Fentanyl 50 mcg was administered intravenously. Moderate Sedation Time: 11 minutes. The patient's level of consciousness and vital signs were monitored continuously by radiology nursing throughout the procedure under my direct supervision. CONTRAST:  None FLUOROSCOPY TIME:  0.3 minutes (2.7 mGy) COMPLICATIONS:  None immediate. PROCEDURE: The procedure, risks, benefits, and alternatives were explained to the patient. Questions regarding the procedure were encouraged and answered. The patient understands and consents to the procedure. The right neck and chest were prepped with chlorhexidine in a sterile fashion, and a sterile drape was applied covering the operative field. Maximum barrier sterile technique with sterile gowns and gloves were used for the procedure. A timeout was performed prior to the initiation of the procedure. Ultrasound was used to examine the jugular vein which was compressible and free of internal echoes. A skin marker was used to demarcate the planned venotomy and port pocket incision sites. Local anesthesia was provided to these sites and the subcutaneous tunnel track with 1% lidocaine with 1:100,000 epinephrine. A small incision was created at the jugular access site and blunt dissection was performed of the subcutaneous tissues. Under real time ultrasound guidance, the jugular vein was accessed with a 21 ga micropuncture needle and an 0.018" wire was inserted to the superior vena cava. A 5 Fr micopuncture set was then used, through which a 0.035" Rosen wire was passed under fluoroscopic guidance into the  inferior vena cava. An 8 Fr dilator was then placed over the wire. A subcutaneous port pocket was then created along the upper chest wall utilizing a combination of sharp and blunt dissection. The pocket was irrigated with sterile saline, packed with gauze, and observed for hemorrhage. A single lumen "ISP" sized power injectable port was chosen for placement. The 8 Fr catheter was tunneled from the port pocket site to the venotomy incision. The port was placed in the pocket. The external catheter was trimmed to appropriate length. The dilator was exchanged for an 8 Fr peel-away sheath under fluoroscopic guidance. The catheter was then placed through the sheath and the sheath was removed. Final catheter positioning was confirmed and documented with a fluoroscopic spot radiograph. The port was accessed with a Huber needle, aspirated, and flushed with heparinized saline. The deep dermal layer of the port pocket incision was closed with interrupted 3-0 Vicryl suture. Dermabond was then placed over the port pocket and neck incisions. The patient tolerated the procedure well without immediate post procedural complication. FINDINGS: After catheter placement, the tip lies within the cavoatrial junction. The catheter aspirates and flushes normally and is ready for immediate use. IMPRESSION: Successful placement of a power injectable Port-A-Cath via the right internal jugular vein. The catheter is ready for immediate use. Ruthann Cancer, MD [facility] Electronically Signed   By: Ruthann Cancer MD   On: 04/21/2020 14:20   US THORACENTESIS ASP PLEURAL SPACE W/IMG GUIDE  Result Date: 04/24/2020 INDICATION: Right-sided lung cancer. Recurrent right-sided pleural effusion. Request for or therapeutic thoracentesis. EXAM: ULTRASOUND GUIDED RIGHT THORACENTESIS MEDICATIONS: 1% plain lidocaine, 10 mL COMPLICATIONS: None immediate. PROCEDURE: An ultrasound guided thoracentesis  was thoroughly discussed with the patient and questions answered. The benefits, risks, alternatives and complications were also discussed. The patient understands and wishes to proceed with the procedure. Written consent was obtained. Ultrasound was performed to localize and mark an adequate pocket of fluid in the right chest. The area was then prepped and draped in the normal sterile fashion. 1% Lidocaine was used for local anesthesia. Under ultrasound guidance a 6 Fr Safe-T-Centesis catheter was introduced. Thoracentesis was performed. The catheter was removed and a dressing applied. FINDINGS: A total of approximately 1.7 L of blood-tinged fluid was removed. IMPRESSION: Successful ultrasound guided right thoracentesis yielding 1.7 L of pleural fluid.  Read by: Ascencion Dike PA-C Electronically Signed   By: Ruthann Cancer MD   On: 04/21/2020 16:49   US THORACENTESIS ASP PLEURAL SPACE W/IMG GUIDE  Result Date: 04/14/2020 INDICATION: Patient with suspected right lung cancer and recurrent right pleural effusions presents today for therapeutic thoracentesis prior to CT biopsy procedure. EXAM: ULTRASOUND GUIDED THORACENTESIS MEDICATIONS: 1% lidocaine 10 COMPLICATIONS: None immediate. PROCEDURE: An ultrasound guided thoracentesis was thoroughly discussed with the patient and questions answered. The benefits, risks, alternatives and complications were also discussed. The patient understands and wishes to proceed with the procedure. Written consent was obtained. Ultrasound was performed to localize and mark an adequate pocket of fluid in the right chest. The area was then prepped and draped in the normal sterile fashion. 1% Lidocaine was used for local anesthesia. Under ultrasound guidance a 6 Fr Safe-T-Centesis catheter was introduced. Thoracentesis was performed. The catheter was removed and a dressing applied. FINDINGS: A total of approximately 1.6 L of amber-colored fluid was removed. Samples were sent to the  laboratory as requested by the clinical team. IMPRESSION: Successful ultrasound guided right thoracentesis yielding 1.6 L of pleural fluid. Read by: Soyla Dryer, NP Electronically Signed   By: Jerilynn Mages.  Shick M.D.   On: 04/14/2020 11:21    Assessment and plan- Patient is a 75 y.o. male diagnosed with extensive stage small cell lung cancer currently s/p cycle 1 of chemotherapy who presents to Symptom Management for follow up post chemo and shortness of breath.   1. Shortness of breath secondary to malignant pleural effusion  - improved. S/p thoracentesis. Continue prn home oxygen. Morphine as directed.   2. Neutropenia - likely chemo associated. [place] [address]. Afebrile. Received GCSF with treatment. Neutropenic precautions reviewed  3. Thrombocytopenia - plt 60,000. No bleeding or bruising. Likely chemotherapy related. Continue to monitor.   4. Weight loss - 10 lb weight loss in 10 days. Encouraged small frequent, protein rich meals to minimize weight loss and protein wasting.   Follow up with Dr. Janese Banks and [facility] as scheduled. Return to [facility] as needed.    Visit Diagnosis 1. Small cell lung cancer, right ([place])   2. Pleural effusion   3. Chemotherapy induced neutropenia (HCC)   4. Chemotherapy-induced thrombocytopenia   5. Unintentional weight loss     Patient expressed understanding and was in agreement with this plan. He also understands that He can call clinic at any time with any questions, concerns, or complaints.   Thank you for allowing me to participate in the care of this very pleasant patient.   Beckey Rutter, DNP, AGNP-C [place] [facility] [place]  CC: Dr. Janese Banks

## 2020-05-09 ENCOUNTER — Telehealth: Payer: Self-pay | Admitting: *Deleted

## 2020-05-09 NOTE — Telephone Encounter (Signed)
[employer] N[employer] called stating that patient is requesting a shower chair and is asking if Dr Janese Banks will send prescription for this so patient can get one. Please notify patient when done

## 2020-05-09 NOTE — Telephone Encounter (Signed)
Filled out form, called pt to let him know the form is ready for him to pick up

## 2020-05-10 NOTE — Telephone Encounter (Signed)
I called pt and let him know that I have recvd phone call from his case manager Gerilyn Nestle [phone]. She says that he can get shower chair and if in network- try [facility], or [facility] or [facility] all are in [place]. He has met his in network and out of network he has met his deductable and he will pay 10% of the cost. I called [facility] and they told me that they have 3 different chairs to choose from. I asked pt if he would like to go to [facility] and he will have his wife go there now and I faxed all the info to [employer]

## 2020-05-11 ENCOUNTER — Telehealth: Payer: Self-pay | Admitting: *Deleted

## 2020-05-11 NOTE — Telephone Encounter (Signed)
Called pt to talk from [facility] that said they were not able to get shower chair. I asked pt what happened and he put his wife on the phone. She said that it took the person at [facility] at least 15 minutes to find the order. She told the person there that it was sent right before she got there. I had called the business ahead of time to let them know the wife was coming and I was told by the insurance case manager for the company that [facility] was in network and he could pick out shower chair and he would pay 10 % because he has already met his deductable. The wife says they could offer her a Bedside commode that can be used at shower chair. The wife says that it is too big and would not fit in the tub. Then the staff told her that it would not be covered with his insurance and they are out of network with [facility]. I told both of them that I would call the case manager again and leave a message about above and see if she can help the situation. The wife is willing to call in network places if she has phone number. I called case manager that is Margreta Journey  But the person who made the original note to me called her Gerilyn Nestle. Her voicemail states she is on vacation and gave me another extension to talk to I trf there and left her a message for further instructions since the above ones did not work out and left my number to call

## 2020-05-12 ENCOUNTER — Encounter: Payer: Self-pay | Admitting: Oncology

## 2020-05-15 ENCOUNTER — Inpatient Hospital Stay: Payer: Managed Care, Other (non HMO)

## 2020-05-15 ENCOUNTER — Encounter: Payer: Self-pay | Admitting: *Deleted

## 2020-05-15 ENCOUNTER — Encounter: Payer: Self-pay | Admitting: Oncology

## 2020-05-15 ENCOUNTER — Inpatient Hospital Stay (HOSPITAL_BASED_OUTPATIENT_CLINIC_OR_DEPARTMENT_OTHER): Payer: Managed Care, Other (non HMO) | Admitting: Oncology

## 2020-05-15 ENCOUNTER — Inpatient Hospital Stay (HOSPITAL_BASED_OUTPATIENT_CLINIC_OR_DEPARTMENT_OTHER): Payer: Managed Care, Other (non HMO) | Admitting: Hospice and Palliative Medicine

## 2020-05-15 VITALS — BP 122/64 | HR 74 | Temp 97.2°F | Resp 20 | Wt 324.0 lb

## 2020-05-15 DIAGNOSIS — Z5181 Encounter for therapeutic drug level monitoring: Secondary | ICD-10-CM

## 2020-05-15 DIAGNOSIS — C3491 Malignant neoplasm of unspecified part of right bronchus or lung: Secondary | ICD-10-CM

## 2020-05-15 DIAGNOSIS — Z515 Encounter for palliative care: Secondary | ICD-10-CM

## 2020-05-15 DIAGNOSIS — C7951 Secondary malignant neoplasm of bone: Secondary | ICD-10-CM

## 2020-05-15 DIAGNOSIS — D751 Secondary polycythemia: Secondary | ICD-10-CM

## 2020-05-15 DIAGNOSIS — Z5111 Encounter for antineoplastic chemotherapy: Secondary | ICD-10-CM | POA: Diagnosis not present

## 2020-05-15 DIAGNOSIS — Z5112 Encounter for antineoplastic immunotherapy: Secondary | ICD-10-CM

## 2020-05-15 DIAGNOSIS — Z7983 Long term (current) use of bisphosphonates: Secondary | ICD-10-CM

## 2020-05-15 LAB — COMPREHENSIVE METABOLIC PANEL
ALT: 37 U/L (ref 0–44)
AST: 30 U/L (ref 15–41)
Albumin: 3.3 g/dL — ABNORMAL LOW (ref 3.5–5.0)
Alkaline Phosphatase: 108 U/L (ref 38–126)
Anion gap: 8 (ref 5–15)
BUN: 9 mg/dL (ref 8–23)
CO2: 24 mmol/L (ref 22–32)
Calcium: 8.7 mg/dL — ABNORMAL LOW (ref 8.9–10.3)
Chloride: 103 mmol/L (ref 98–111)
Creatinine, Ser: 0.77 mg/dL (ref 0.61–1.24)
GFR, Estimated: >60 mL/min (ref >60)
Glucose, Bld: 168 mg/dL — ABNORMAL HIGH (ref 70–99)
Potassium: 3.9 mmol/L (ref 3.5–5.1)
Sodium: 135 mmol/L (ref 135–145)
Total Bilirubin: 0.4 mg/dL (ref 0.3–1.2)
Total Protein: 6.6 g/dL (ref 6.5–8.1)

## 2020-05-15 LAB — CBC WITH DIFFERENTIAL/PLATELET
Abs Immature Granulocytes: 0.52 10*3/uL — ABNORMAL HIGH (ref 0.00–0.07)
Basophils Absolute: 0.1 10*3/uL (ref 0.0–0.1)
Basophils Relative: 1 %
Eosinophils Absolute: 0 10*3/uL (ref 0.0–0.5)
Eosinophils Relative: 0 %
HCT: 48.1 % (ref 39.0–52.0)
Hemoglobin: 16.6 g/dL (ref 13.0–17.0)
Immature Granulocytes: 4 %
Lymphocytes Relative: 19 %
Lymphs Abs: 2.6 10*3/uL (ref 0.7–4.0)
MCH: 31.8 pg (ref 26.0–34.0)
MCHC: 34.5 g/dL (ref 30.0–36.0)
MCV: 92.1 fL (ref 80.0–100.0)
Monocytes Absolute: 1 10*3/uL (ref 0.1–1.0)
Monocytes Relative: 7 %
Neutro Abs: 9.7 10*3/uL — ABNORMAL HIGH (ref 1.7–7.7)
Neutrophils Relative %: 69 %
Platelets: 246 10*3/uL (ref 150–400)
RBC: 5.22 MIL/uL (ref 4.22–5.81)
RDW: 14.3 % (ref 11.5–15.5)
WBC: 14 10*3/uL — ABNORMAL HIGH (ref 4.0–10.5)
nRBC: 0 % (ref 0.0–0.2)

## 2020-05-15 MED ORDER — SODIUM CHLORIDE 0.9 % IV SOLN
1200.0000 mg | Freq: Once | INTRAVENOUS | Status: AC
  Administered 2020-05-15: 1200 mg via INTRAVENOUS
  Filled 2020-05-15: qty 20

## 2020-05-15 MED ORDER — SODIUM CHLORIDE 0.9 % IV SOLN
750.0000 mg | Freq: Once | INTRAVENOUS | Status: AC
  Administered 2020-05-15: 750 mg via INTRAVENOUS
  Filled 2020-05-15: qty 75

## 2020-05-15 MED ORDER — HEPARIN SOD (PORK) LOCK FLUSH 100 UNIT/ML IV SOLN
500.0000 [IU] | Freq: Once | INTRAVENOUS | Status: AC | PRN
  Administered 2020-05-15: 500 [IU]
  Filled 2020-05-15: qty 5

## 2020-05-15 MED ORDER — SODIUM CHLORIDE 0.9 % IV SOLN
10.0000 mg | Freq: Once | INTRAVENOUS | Status: AC
  Administered 2020-05-15: 10 mg via INTRAVENOUS
  Filled 2020-05-15: qty 10

## 2020-05-15 MED ORDER — SODIUM CHLORIDE 0.9 % IV SOLN
100.0000 mg/m2 | Freq: Once | INTRAVENOUS | Status: AC
  Administered 2020-05-15: 280 mg via INTRAVENOUS
  Filled 2020-05-15: qty 14

## 2020-05-15 MED ORDER — PALONOSETRON HCL INJECTION 0.25 MG/5ML
0.2500 mg | Freq: Once | INTRAVENOUS | Status: AC
  Administered 2020-05-15: 0.25 mg via INTRAVENOUS
  Filled 2020-05-15: qty 5

## 2020-05-15 MED ORDER — SODIUM CHLORIDE 0.9 % IV SOLN
Freq: Once | INTRAVENOUS | Status: AC
Start: 2020-05-15 — End: 2020-05-15
  Filled 2020-05-15: qty 250

## 2020-05-15 MED ORDER — SODIUM CHLORIDE 0.9 % IV SOLN
150.0000 mg | Freq: Once | INTRAVENOUS | Status: AC
  Administered 2020-05-15: 150 mg via INTRAVENOUS
  Filled 2020-05-15: qty 150

## 2020-05-15 MED ORDER — HEPARIN SOD (PORK) LOCK FLUSH 100 UNIT/ML IV SOLN
INTRAVENOUS | Status: AC
  Filled 2020-05-15: qty 5

## 2020-05-15 MED ORDER — ZOLEDRONIC ACID 4 MG/100ML IV SOLN
4.0000 mg | Freq: Once | INTRAVENOUS | Status: AC
  Administered 2020-05-15: 4 mg via INTRAVENOUS
  Filled 2020-05-15: qty 100

## 2020-05-15 NOTE — Progress Notes (Signed)
  Oncology Nurse Navigator Documentation  Navigator Location: [facility]-Med Onc (05/15/20 0900)   )Navigator Encounter Type: Follow-up Appt;Treatment (05/15/20 0900)                     Patient Visit Type: MedOnc (05/15/20 0900) Treatment Phase: Treatment (05/15/20 0900) Barriers/Navigation Needs: No Barriers At This Time;No Needs;No Questions (05/15/20 0900)   Interventions: None Required (05/15/20 0900)           met with patient during follow up visit with Dr. Janese Banks. Pt did not have any questions or needs during visit. Instructed pt to call with any further questions or needs. Pt verbalized understanding. Nothing further needed at this time.           Time Spent with Patient: 30 (05/15/20 0900)

## 2020-05-15 NOTE — Progress Notes (Signed)
Patient denies any concerns today.  

## 2020-05-15 NOTE — Progress Notes (Signed)
[place]  [phone] [phone]   Name: David Johnson Date: 05/15/2020 MRN: [id number]  DOB: 1945/05/03  Patient Care Team: David Pink, MD as PCP - General (Family Medicine)    REASON FOR CONSULTATION: David Johnson is a 75 y.o. 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  PMH is also notable for COPD and OSA requiring CPAP.  Palliative care was consulted help address goals manage ongoing symptoms.   SOCIAL HISTORY:     reports that he has been smoking cigarettes and cigars. He has a 52.50 pack-year smoking history. He has never used smokeless tobacco. He reports current alcohol use of about 8.0 standard drinks of alcohol per week. He reports that he does not use drugs.  Patient is married and lives at home with his wife and daughter.  He has another daughter who lives nearby.  Patient works as a Medical laboratory scientific officer for [employer]:  None on file  CODE STATUS:   PAST MEDICAL HISTORY: Past Medical History:  Diagnosis Date  . Adenomatous polyps   . Barrett's esophagus   . Colon polyp   . COPD (chronic obstructive pulmonary disease) ([place])    uses inhaler, "i may have COPD"  . Dyspnea   . Eye injury   . GERD (gastroesophageal reflux disease)   . History of gonorrhea   . History of pneumonia   . Obesity   . Pneumonia   . Reflux esophagitis   . Sleep apnea     PAST SURGICAL HISTORY:  Past Surgical History:  Procedure Laterality Date  . CATARACT EXTRACTION Left   . COLONOSCOPY    . COLONOSCOPY WITH PROPOFOL N/A 05/19/2015   Procedure: COLONOSCOPY WITH PROPOFOL;  Surgeon: David Sails, MD;  Location[facility] END[facility]COPY;  Service: Endoscopy;  Laterality: N/A;  . COLONOSCOPY WITH PROPOFOL N/A 09/11/2018   Procedure: COLONOSCOPY WITH PROPOFOL;  Surgeon: David Sails, MD;   Location: [facility] ENDOSCOPY;  Service: Endoscopy;  Laterality: N/A;  . ESOPHAGOGASTRODUODENOSCOPY (EGD) WITH PROPOFOL N/A 05/19/2015   Procedure: ESOPHAGOGASTRODUODENOSCOPY (EGD) WITH PROPOFOL;  Surgeon: David Sails, MD;  Location: [facility] ENDOSCOPY;  Service: Endoscopy;  Laterality: N/A;  . ESOPHAGOGASTRODUODENOSCOPY (EGD) WITH PROPOFOL N/A 10/11/2016   Procedure: ESOPHAGOGASTRODUODENOSCOPY (EGD) WITH PROPOFOL;  Surgeon: David Sails, MD;  Location: [facility] ENDOSCOPY;  Service: Endoscopy;  Laterality: N/A;  . ESOPHAGOGASTRODUODENOSCOPY (EGD) WITH PROPOFOL N/A 01/01/2018   Procedure: ESOPHAGOGASTRODUODENOSCOPY (EGD) WITH PROPOFOL;  Surgeon: David Sails, MD;  Location: [facility] ENDOSCOPY;  Service: Endoscopy;  Laterality: N/A;  . ESOPHAGOGASTRODUODENOSCOPY (EGD) WITH PROPOFOL N/A 09/11/2018   Procedure: ESOPHAGOGASTRODUODENOSCOPY (EGD) WITH PROPOFOL;  Surgeon: David Sails, MD;  Location: [facility] ENDOSCOPY;  Service: Endoscopy;  Laterality: N/A;  . EYE SURGERY    . IR IMAGING GUIDED PORT INSERTION  04/21/2020    HEMATOLOGY/ONCOLOGY HISTORY:  Oncology History  Small cell lung cancer, right ([place])  04/18/2020 Initial Diagnosis   Small cell lung cancer, right ([place])   04/18/2020 Cancer Staging   Staging form: Lung, AJCC 8th Edition - Clinical: Stage IVB (cT3, cN2, pM1c) - Signed by David Guadeloupe, MD on 04/18/2020   04/24/2020 -  Chemotherapy    Patient is on Treatment Plan: LUNG SCLC CARBOPLATIN + ETOPOSIDE + ATEZOLIZUMAB INDUCTION Q21D / ATEZOLIZUMAB MAINTENANCE Q21D        ALLERGIES:  is allergic to cephalexin.  MEDICATIONS:  Current Outpatient  Medications  Medication Sig Dispense Refill  . albuterol (PROVENTIL HFA;VENTOLIN HFA) 108 (90 Base) MCG/ACT inhaler 2 puffs q.i.d. p.r.n. short of breath, wheezing, or cough    . lidocaine-prilocaine (EMLA) cream Apply to affected area once 30 g 3  . LORazepam (ATIVAN) 0.5 MG tablet Take 1 tablet (0.5 mg total) by mouth 3 (three) times daily  as needed (Nausea or vomiting). (Patient not taking: No sig reported) 30 tablet 0  . Morphine Sulfate, Concentrate, 5 MG/0.25ML SOLN Take 5 mg by mouth every 4 (four) hours as needed. For dyspnea (Patient not taking: No sig reported) 30 mL 0  . ondansetron (ZOFRAN) 8 MG tablet Take 1 tablet (8 mg total) by mouth 2 (two) times daily as needed for refractory nausea / vomiting. Start on day 3 after carboplatin chemo. (Patient not taking: No sig reported) 30 tablet 1  . prochlorperazine (COMPAZINE) 10 MG tablet Take 1 tablet (10 mg total) by mouth every 6 (six) hours as needed (Nausea or vomiting). (Patient not taking: No sig reported) 30 tablet 1   No current facility-administered medications for this visit.   Facility-Administered Medications Ordered in Other Visits  Medication Dose Route Frequency Provider Last Rate Last Admin  . atezolizumab (TECENTRIQ) 1,200 mg in sodium chloride 0.9 % 250 mL chemo infusion  1,200 mg Intravenous Once David Guadeloupe, MD      . CARBOplatin (PARAPLATIN) 750 mg in sodium chloride 0.9 % 250 mL chemo infusion  750 mg Intravenous Once David Guadeloupe, MD      . etoposide (VEPESID) 280 mg in sodium chloride 0.9 % 1,000 mL chemo infusion  100 mg/m2 (Treatment Plan Recorded) Intravenous Once David Guadeloupe, MD      . heparin lock flush 100 unit/mL  500 Units Intracatheter Once PRN David Guadeloupe, MD        VITAL SIGNS: There were no vitals taken for this visit. There were no vitals filed for this visit.  Estimated body mass index is 42.75 kg/m as calculated from the following:   Height as of 04/21/20: 6\' 1"  (1.854 m).   Weight as of an earlier encounter on 05/15/20: 324 lb (147 kg).  LABS: CBC:    Component Value Date/Time   WBC 14.0 (H) 05/15/2020 0813   HGB 16.6 05/15/2020 0813   HGB 17.3 05/13/2013 1335   HCT 48.1 05/15/2020 0813   HCT 51.8 05/13/2013 1335   PLT 246 05/15/2020 0813   PLT 170 05/13/2013 1335   MCV 92.1 05/15/2020 0813   MCV 93 05/13/2013  1335   NEUTROABS 9.7 (H) 05/15/2020 0813   NEUTROABS 7.0 (H) 05/13/2013 1335   LYMPHSABS 2.6 05/15/2020 0813   LYMPHSABS 3.2 05/13/2013 1335   MONOABS 1.0 05/15/2020 0813   MONOABS 1.0 05/13/2013 1335   EOSABS 0.0 05/15/2020 0813   EOSABS 0.2 05/13/2013 1335   BASOSABS 0.1 05/15/2020 0813   BASOSABS 0.1 05/13/2013 1335   Comprehensive Metabolic Panel:    Component Value Date/Time   NA 135 05/15/2020 0813   K 3.9 05/15/2020 0813   CL 103 05/15/2020 0813   CO2 24 05/15/2020 0813   BUN 9 05/15/2020 0813   CREATININE 0.77 05/15/2020 0813   GLUCOSE 168 (H) 05/15/2020 0813   CALCIUM 8.7 (L) 05/15/2020 0813   AST 30 05/15/2020 0813   ALT 37 05/15/2020 0813   ALKPHOS 108 05/15/2020 0813   BILITOT 0.4 05/15/2020 0813   PROT 6.6 05/15/2020 0813   ALBUMIN 3.3 (L) 05/15/2020 0813  RADIOGRAPHIC STUDIES: DG Chest 1 View  Result Date: 04/21/2020 CLINICAL DATA:  Shortness of breath, pleural effusion. EXAM: CHEST  1 VIEW COMPARISON:  Chest radiograph done earlier the same day and CT chest 03/30/2020. FINDINGS: Single lateral view of the chest shows a large pleural effusion, shown to be on the right on the frontal view performed earlier the same day. Associated collapse/consolidation in the adjacent right lung. No left pleural effusion. IMPRESSION: Large right pleural effusion with associated collapse/consolidation in the adjacent lung. Electronically Signed   By: Lorin Picket M.D.   On: 04/21/2020 11:41   DG Chest Port 1 View  Result Date: 04/21/2020 CLINICAL DATA:  Status post thoracentesis. Small cell lung cancer. Pleural effusion. EXAM: PORTABLE CHEST 1 VIEW COMPARISON:  04/21/2020 FINDINGS: RIGHT-sided PowerPort tip overlies the superior vena cava. RIGHT-sided pleural effusion is significantly smaller. There is persistent RIGHT LOWER lobe opacity likely representing atelectasis and possible consolidation. There is no pneumothorax. LEFT lung is clear. IMPRESSION: Decreased RIGHT pleural  effusion. No pneumothorax. Electronically Signed   By: Nolon Nations M.D.   On: 04/21/2020 16:51   DG Chest Port 1 View  Result Date: 04/21/2020 CLINICAL DATA:  Worsening shortness of breath. EXAM: PORTABLE CHEST 1 VIEW COMPARISON:  04/14/2020 FINDINGS: Two AP portable radiographs. Right Port-A-Cath tip at superior caval/atrial junction. Midline trachea. Mild cardiomegaly. Significant enlargement of moderate right pleural effusion. No left-sided pleural fluid or pneumothorax. The right hilar mass and paratracheal adenopathy are partially obscured by progressive right mid and lower lung airspace disease. No overt congestive failure. Left lung is clear. IMPRESSION: Increased moderate right pleural effusion with worsening right-sided aeration, most likely due to atelectasis. Right hilar mass and paratracheal adenopathy are partially obscured by progressive right-sided airspace disease. Electronically Signed   By: Abigail Miyamoto M.D.   On: 04/21/2020 10:57   IR IMAGING GUIDED PORT INSERTION  Result Date: 04/21/2020 INDICATION: 75 year old male with history of advanced stage right lung cancer requiring central venous access for chemotherapy. EXAM: IMPLANTED PORT A CATH PLACEMENT WITH ULTRASOUND AND FLUOROSCOPIC GUIDANCE COMPARISON:  None. MEDICATIONS: None. ANESTHESIA/SEDATION: Moderate (conscious) sedation was employed during this procedure. A total of Versed 1 mg and Fentanyl 50 mcg was administered intravenously. Moderate Sedation Time: 11 minutes. The patient's level of consciousness and vital signs were monitored continuously by radiology nursing throughout the procedure under my direct supervision. CONTRAST:  None FLUOROSCOPY TIME:  0.3 minutes (2.7 mGy) COMPLICATIONS: None immediate. PROCEDURE: The procedure, risks, benefits, and alternatives were explained to the patient. Questions regarding the procedure were encouraged and answered. The patient understands and consents to the procedure. The right neck  and chest were prepped with chlorhexidine in a sterile fashion, and a sterile drape was applied covering the operative field. Maximum barrier sterile technique with sterile gowns and gloves were used for the procedure. A timeout was performed prior to the initiation of the procedure. Ultrasound was used to examine the jugular vein which was compressible and free of internal echoes. A skin marker was used to demarcate the planned venotomy and port pocket incision sites. Local anesthesia was provided to these sites and the subcutaneous tunnel track with 1% lidocaine with 1:100,000 epinephrine. A small incision was created at the jugular access site and blunt dissection was performed of the subcutaneous tissues. Under real time ultrasound guidance, the jugular vein was accessed with a 21 ga micropuncture needle and an 0.018" wire was inserted to the superior vena cava. A 5 Fr micopuncture set was then used, through  which a 0.035" Rosen wire was passed under fluoroscopic guidance into the inferior vena cava. An 8 Fr dilator was then placed over the wire. A subcutaneous port pocket was then created along the upper chest wall utilizing a combination of sharp and blunt dissection. The pocket was irrigated with sterile saline, packed with gauze, and observed for hemorrhage. A single lumen "ISP" sized power injectable port was chosen for placement. The 8 Fr catheter was tunneled from the port pocket site to the venotomy incision. The port was placed in the pocket. The external catheter was trimmed to appropriate length. The dilator was exchanged for an 8 Fr peel-away sheath under fluoroscopic guidance. The catheter was then placed through the sheath and the sheath was removed. Final catheter positioning was confirmed and documented with a fluoroscopic spot radiograph. The port was accessed with a Huber needle, aspirated, and flushed with heparinized saline. The deep dermal layer of the port pocket incision was closed with  interrupted 3-0 Vicryl suture. Dermabond was then placed over the port pocket and neck incisions. The patient tolerated the procedure well without immediate post procedural complication. FINDINGS: After catheter placement, the tip lies within the cavoatrial junction. The catheter aspirates and flushes normally and is ready for immediate use. IMPRESSION: Successful placement of a power injectable Port-A-Cath via the right internal jugular vein. The catheter is ready for immediate use. Ruthann Cancer, MD [facility] Electronically Signed   By: Ruthann Cancer MD   On: 04/21/2020 14:20   US THORACENTESIS ASP PLEURAL SPACE W/IMG GUIDE  Result Date: 04/24/2020 INDICATION: Right-sided lung cancer. Recurrent right-sided pleural effusion. Request for or therapeutic thoracentesis. EXAM: ULTRASOUND GUIDED RIGHT THORACENTESIS MEDICATIONS: 1% plain lidocaine, 10 mL COMPLICATIONS: None immediate. PROCEDURE: An ultrasound guided thoracentesis was thoroughly discussed with the patient and questions answered. The benefits, risks, alternatives and complications were also discussed. The patient understands and wishes to proceed with the procedure. Written consent was obtained. Ultrasound was performed to localize and mark an adequate pocket of fluid in the right chest. The area was then prepped and draped in the normal sterile fashion. 1% Lidocaine was used for local anesthesia. Under ultrasound guidance a 6 Fr Safe-T-Centesis catheter was introduced. Thoracentesis was performed. The catheter was removed and a dressing applied. FINDINGS: A total of approximately 1.7 L of blood-tinged fluid was removed. IMPRESSION: Successful ultrasound guided right thoracentesis yielding 1.7 L of pleural fluid. Read by: Ascencion Dike PA-C Electronically Signed   By: Ruthann Cancer MD   On: 04/21/2020 16:49    PERFORMANCE STATUS (ECOG) : 1 - Symptomatic but completely ambulatory  Review of  Systems Unless otherwise noted, a complete review of systems is negative.  Physical Exam General: NAD Pulmonary: Unlabored Extremities: no edema, no joint deformities Skin: no rashes Neurological: Weakness but otherwise nonfocal  IMPRESSION: Patient reports that he is doing well.  He denies any significant changes or concerns.  He denies any symptomatic complaints at present.  He reports stable performance status and good oral intake.  No issues with medications.  I sent patient home with ACP documents and a MOST form to review.  PLAN: -Continue current scope of treatment -ACP/MOST form reviewed -Follow-up MyChart visit 2-3 months   Patient expressed understanding and was in agreement with this plan. He also understands that He can call the clinic at any time with any questions, concerns, or complaints.     Time Total: 15 minutes  Visit consisted of counseling and education dealing  with the complex and emotionally intense issues of symptom management and palliative care in the setting of serious and potentially life-threatening illness.Greater than 50%  of this time was spent counseling and coordinating care related to the above assessment and plan.  Signed by: Altha Harm, PhD, NP-C

## 2020-05-15 NOTE — Progress Notes (Signed)
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 Past medical history significant for COPD, GERD and obstructive sleep apnea. CT angio chest abdomen pelvis showed 6.4 x 3.7 cm right paratracheal adenopathy concerning for malignancy. 1.9 cm right hilar node. 12 mm anterior mediastinal lymph node. 2.8 cm right thyroid nodule. Several pleural-based masses concerning for metastatic disease. Large irregular opacity in the right upper lobe which may be malignancy versus postobstructive atelectasis. Lytic lesion noted in the manubrium consistent with metastatic disease.Moderate right-sided pleural effusion s/p thoracentesis.   Cytology showedPoorly differentiated carcinoma with neuroendocrine differentiation. Tumor cells were positive for pancytokeratin and CD56 but negative for TTF-1 p40 and synaptophysin. Chromogranin stain was also negative.  MRI brain was negative for metastatic disease   Interval history-patient reports that her shortness of breath is improved.  He still has some baseline fatigue.  He has otherwise tolerated chemotherapy well without any significant side effects.  ECOG PS- 1 Pain scale- 0   Review of systems- Review of Systems  Constitutional: Positive for malaise/fatigue. Negative for chills, fever and weight loss.  HENT: Negative  for congestion, ear discharge and nosebleeds.   Eyes: Negative for blurred vision.  Respiratory: Positive for shortness of breath. Negative for cough, hemoptysis, sputum production and wheezing.   Cardiovascular: Negative for chest pain, palpitations, orthopnea and claudication.  Gastrointestinal: Negative for abdominal pain, blood in stool, constipation, diarrhea, heartburn, melena, nausea and vomiting.  Genitourinary: Negative for dysuria, flank pain, frequency, hematuria and urgency.  Musculoskeletal: Negative for back pain, joint pain and myalgias.  Skin: Negative for rash.  Neurological: Negative for dizziness, tingling, focal weakness, seizures, weakness and headaches.  Endo/Heme/Allergies: Does not bruise/bleed easily.  Psychiatric/Behavioral: Negative for depression and suicidal ideas. The patient does not have insomnia.       Allergies  Allergen Reactions  . Cephalexin Rash     Past Medical History:  Diagnosis Date  . Adenomatous polyps   . Barrett's esophagus   . Colon polyp   . COPD (chronic obstructive pulmonary disease) ([place])    uses inhaler, "i may have COPD"  . Dyspnea   . Eye injury   . GERD (gastroesophageal reflux disease)   . History of gonorrhea   . History of pneumonia   . Obesity   . Pneumonia   . Reflux esophagitis   . Sleep apnea      Past Surgical History:  Procedure Laterality Date  . CATARACT EXTRACTION Left   . COLONOSCOPY    . COLONOSCOPY WITH PROPOFOL N/A 05/19/2015   Procedure: COLONOSCOPY WITH PROPOFOL;  Surgeon: [name], MD;  Location: [facility] ENDOSCOPY;  Service: Endoscopy;  Laterality: N/A;  . COLONOSCOPY WITH PROPOFOL N/A 09/11/2018   Procedure: COLONOSCOPY WITH PROPOFOL;  Surgeon: [name], MD;  Location: [facility] ENDOSCOPY;  Service: Endoscopy;  Laterality: N/A;  . ESOPHAGOGASTRODUODENOSCOPY (EGD) WITH PROPOFOL N/A 05/19/2015   Procedure: ESOPHAGOGASTRODUODENOSCOPY (EGD) WITH PROPOFOL;  Surgeon: [name], MD;   Location: [facility] ENDOSCOPY;  Service: Endoscopy;  Laterality: N/A;  . ESOPHAGOGASTRODUODENOSCOPY (EGD) WITH PROPOFOL N/A 10/11/2016   Procedure: ESOPHAGOGASTRODUODENOSCOPY (EGD) WITH PROPOFOL;  Surgeon: [name], MD;  Location: [facility] ENDOSCOPY;  Service: Endoscopy;  Laterality: N/A;  . ESOPHAGOGASTRODUODENOSCOPY (EGD) WITH PROPOFOL N/A 01/01/2018   Procedure: ESOPHAGOGASTRODUODENOSCOPY (EGD) WITH PROPOFOL;  Surgeon: [name], MD;  Location: [facility] ENDOSCOPY;  Service: Endoscopy;  Laterality: N/A;  . ESOPHAGOGASTRODUODENOSCOPY (EGD) WITH PROPOFOL N/A 09/11/2018   Procedure: ESOPHAGOGASTRODUODENOSCOPY (EGD) WITH PROPOFOL;  Surgeon: [name], MD;  Location: [facility] ENDOSCOPY;  Service: Endoscopy;  Laterality: N/A;  . EYE SURGERY    . IR IMAGING GUIDED PORT INSERTION  04/21/2020    Social History   Socioeconomic History  . Marital status: Married    Spouse name: Not on file  . Number of children: Not on file  . Years of education: Not on file  . Highest education level: Not on file  Occupational History  . Not on file  Tobacco Use  . Smoking status: Current Every Day Smoker    Packs/day: 1.50    Years: 35.00    Pack years: 52.50    Types: Cigarettes, Cigars    Last attempt to quit: 03/04/2015    Years since quitting: 5.2  . Smokeless tobacco: Never Used  Vaping Use  . Vaping Use: Every day  . Substances: Flavoring  Substance and Sexual Activity  . Alcohol use: Yes    Alcohol/week: 8.0 standard drinks    Types: 8 Cans of beer per week    Comment: Occassionally on weekends   . Drug use: No  . Sexual activity: Not on file  Other Topics Concern  . Not on file  Social History Narrative  . Not on file   Social Determinants of Health   Financial Resource Strain: Not on file  Food Insecurity: Not on file  Transportation Needs: Not on file  Physical Activity: Not on file  Stress: Not on file  Social Connections: Not on file  Intimate Partner Violence: Not on  file    Family History  Problem Relation Age of Onset  . Emphysema Mother      Current Outpatient Medications:  .  albuterol (PROVENTIL HFA;VENTOLIN HFA) 108 (90 Base) MCG/ACT inhaler, 2 puffs q.i.d. p.r.n. short of breath, wheezing, or cough, Disp: , Rfl:  .  lidocaine-prilocaine (EMLA) cream, Apply to affected area once, Disp: 30 g, Rfl: 3 .  LORazepam (ATIVAN) 0.5 MG tablet, Take 1 tablet (0.5 mg total) by mouth 3 (three) times daily as needed (Nausea or vomiting). (Patient not taking: No sig reported), Disp: 30 tablet, Rfl: 0 .  Morphine Sulfate, Concentrate, 5 MG/0.25ML SOLN, Take 5 mg by mouth every 4 (four) hours as needed. For dyspnea (Patient not taking: No sig reported), Disp: 30 mL, Rfl: 0 .  ondansetron (ZOFRAN) 8 MG tablet, Take 1 tablet (8 mg total) by mouth 2 (two) times daily as needed for refractory nausea / vomiting. Start on day 3 after carboplatin chemo. (Patient not taking: No sig reported), Disp: 30 tablet, Rfl: 1 .  prochlorperazine (COMPAZINE) 10 MG tablet, Take 1 tablet (10 mg total) by mouth every 6 (six) hours as needed (Nausea or vomiting). (Patient not taking: No sig reported), Disp: 30 tablet, Rfl: 1 No current facility-administered medications for this visit.  Facility-Administered Medications Ordered in Other Visits:  .  atezolizumab (TECENTRIQ) 1,200 mg in sodium chloride 0.9 % 250 mL chemo  infusion, 1,200 mg, Intravenous, Once, Sindy Guadeloupe, MD .  CARBOplatin (PARAPLATIN) 750 mg in sodium chloride 0.9 % 250 mL chemo infusion, 750 mg, Intravenous, Once, Sindy Guadeloupe, MD .  dexamethasone (DECADRON) 10 mg in sodium chloride 0.9 % 50 mL IVPB, 10 mg, Intravenous, Once, Sindy Guadeloupe, MD, Last Rate: 204 mL/hr at 05/15/20 1003, 10 mg at 05/15/20 1003 .  etoposide (VEPESID) 280 mg in sodium chloride 0.9 % 1,000 mL chemo infusion, 100 mg/m2 (Treatment Plan Recorded), Intravenous, Once, Sindy Guadeloupe, MD .  fosaprepitant (EMEND) 150 mg in sodium chloride 0.9 % 145  mL IVPB, 150 mg, Intravenous, Once, Sindy Guadeloupe, MD .  heparin lock flush 100 unit/mL, 500 Units, Intracatheter, Once PRN, Sindy Guadeloupe, MD  Physical exam:  Vitals:   05/15/20 0839  BP: 122/64  Pulse: 74  Resp: 20  Temp: (!) 97.2 F (36.2 C)  TempSrc: Tympanic  SpO2: 95%  Weight: (!) 324 lb (147 kg)   Physical Exam Constitutional:      Comments: Sitting in a wheelchair and appears in no acute distress  Cardiovascular:     Rate and Rhythm: Normal rate and regular rhythm.     Heart sounds: Normal heart sounds.  Pulmonary:     Effort: Pulmonary effort is normal.     Breath sounds: Normal breath sounds.  Abdominal:     General: Bowel sounds are normal.     Palpations: Abdomen is soft.  Skin:    General: Skin is warm and dry.  Neurological:     Mental Status: He is alert and oriented to person, place, and time.      CMP Latest Ref Rng & Units 05/15/2020  Glucose 70 - 99 mg/dL 168(H)  BUN 8 - 23 mg/dL 9  Creatinine 0.61 - 1.24 mg/dL 0.77  Sodium 135 - 145 mmol/L 135  Potassium 3.5 - 5.1 mmol/L 3.9  Chloride 98 - 111 mmol/L 103  CO2 22 - 32 mmol/L 24  Calcium 8.9 - 10.3 mg/dL 8.7(L)  Total Protein 6.5 - 8.1 g/dL 6.6  Total Bilirubin 0.3 - 1.2 mg/dL 0.4  Alkaline Phos 38 - 126 U/L 108  AST 15 - 41 U/L 30  ALT 0 - 44 U/L 37   CBC Latest Ref Rng & Units 05/15/2020  WBC 4.0 - 10.5 K/uL 14.0(H)  Hemoglobin 13.0 - 17.0 g/dL 16.6  Hematocrit 39.0 - 52.0 % 48.1  Platelets 150 - 400 K/uL 246    No images are attached to the encounter.  DG Chest 1 View  Result Date: 04/21/2020 CLINICAL DATA:  Shortness of breath, pleural effusion. EXAM: CHEST  1 VIEW COMPARISON:  Chest radiograph done earlier the same day and CT chest 03/30/2020. FINDINGS: Single lateral view of the chest shows a large pleural effusion, shown to be on the right on the frontal view performed earlier the same day. Associated collapse/consolidation in the adjacent right lung. No left pleural effusion.  IMPRESSION: Large right pleural effusion with associated collapse/consolidation in the adjacent lung. Electronically Signed   By: Lorin Picket M.D.   On: 04/21/2020 11:41   DG Chest Port 1 View  Result Date: 04/21/2020 CLINICAL DATA:  Status post thoracentesis. Small cell lung cancer. Pleural effusion. EXAM: PORTABLE CHEST 1 VIEW COMPARISON:  04/21/2020 FINDINGS: RIGHT-sided PowerPort tip overlies the superior vena cava. RIGHT-sided pleural effusion is significantly smaller. There is persistent RIGHT LOWER lobe opacity likely representing atelectasis and possible consolidation. There is no pneumothorax. LEFT lung is  clear. IMPRESSION: Decreased RIGHT pleural effusion. No pneumothorax. Electronically Signed   By: Nolon Nations M.D.   On: 04/21/2020 16:51   DG Chest Port 1 View  Result Date: 04/21/2020 CLINICAL DATA:  Worsening shortness of breath. EXAM: PORTABLE CHEST 1 VIEW COMPARISON:  04/14/2020 FINDINGS: Two AP portable radiographs. Right Port-A-Cath tip at superior caval/atrial junction. Midline trachea. Mild cardiomegaly. Significant enlargement of moderate right pleural effusion. No left-sided pleural fluid or pneumothorax. The right hilar mass and paratracheal adenopathy are partially obscured by progressive right mid and lower lung airspace disease. No overt congestive failure. Left lung is clear. IMPRESSION: Increased moderate right pleural effusion with worsening right-sided aeration, most likely due to atelectasis. Right hilar mass and paratracheal adenopathy are partially obscured by progressive right-sided airspace disease. Electronically Signed   By: Abigail Miyamoto M.D.   On: 04/21/2020 10:57   IR IMAGING GUIDED PORT INSERTION  Result Date: 04/21/2020 INDICATION: 75 year old male with history of advanced stage right lung cancer requiring central venous access for chemotherapy. EXAM: IMPLANTED PORT A CATH PLACEMENT WITH ULTRASOUND AND FLUOROSCOPIC GUIDANCE COMPARISON:  None. MEDICATIONS:  None. ANESTHESIA/SEDATION: Moderate (conscious) sedation was employed during this procedure. A total of Versed 1 mg and Fentanyl 50 mcg was administered intravenously. Moderate Sedation Time: 11 minutes. The patient's level of consciousness and vital signs were monitored continuously by radiology nursing throughout the procedure under my direct supervision. CONTRAST:  None FLUOROSCOPY TIME:  0.3 minutes (2.7 mGy) COMPLICATIONS: None immediate. PROCEDURE: The procedure, risks, benefits, and alternatives were explained to the patient. Questions regarding the procedure were encouraged and answered. The patient understands and consents to the procedure. The right neck and chest were prepped with chlorhexidine in a sterile fashion, and a sterile drape was applied covering the operative field. Maximum barrier sterile technique with sterile gowns and gloves were used for the procedure. A timeout was performed prior to the initiation of the procedure. Ultrasound was used to examine the jugular vein which was compressible and free of internal echoes. A skin marker was used to demarcate the planned venotomy and port pocket incision sites. Local anesthesia was provided to these sites and the subcutaneous tunnel track with 1% lidocaine with 1:100,000 epinephrine. A small incision was created at the jugular access site and blunt dissection was performed of the subcutaneous tissues. Under real time ultrasound guidance, the jugular vein was accessed with a 21 ga micropuncture needle and an 0.018" wire was inserted to the superior vena cava. A 5 Fr micopuncture set was then used, through which a 0.035" Rosen wire was passed under fluoroscopic guidance into the inferior vena cava. An 8 Fr dilator was then placed over the wire. A subcutaneous port pocket was then created along the upper chest wall utilizing a combination of sharp and blunt dissection. The pocket was irrigated with sterile saline, packed with gauze, and observed for  hemorrhage. A single lumen "ISP" sized power injectable port was chosen for placement. The 8 Fr catheter was tunneled from the port pocket site to the venotomy incision. The port was placed in the pocket. The external catheter was trimmed to appropriate length. The dilator was exchanged for an 8 Fr peel-away sheath under fluoroscopic guidance. The catheter was then placed through the sheath and the sheath was removed. Final catheter positioning was confirmed and documented with a fluoroscopic spot radiograph. The port was accessed with a Huber needle, aspirated, and flushed with heparinized saline. The deep dermal layer of the port pocket incision was closed with interrupted  3-0 Vicryl suture. Dermabond was then placed over the port pocket and neck incisions. The patient tolerated the procedure well without immediate post procedural complication. FINDINGS: After catheter placement, the tip lies within the cavoatrial junction. The catheter aspirates and flushes normally and is ready for immediate use. IMPRESSION: Successful placement of a power injectable Port-A-Cath via the right internal jugular vein. The catheter is ready for immediate use. Ruthann Cancer, MD [facility] Radiology Electronically Signed   By: Ruthann Cancer MD   On: 04/21/2020 14:20   US THORACENTESIS ASP PLEURAL SPACE W/IMG GUIDE  Result Date: 04/24/2020 INDICATION: Right-sided lung cancer. Recurrent right-sided pleural effusion. Request for or therapeutic thoracentesis. EXAM: ULTRASOUND GUIDED RIGHT THORACENTESIS MEDICATIONS: 1% plain lidocaine, 10 mL COMPLICATIONS: None immediate. PROCEDURE: An ultrasound guided thoracentesis was thoroughly discussed with the patient and questions answered. The benefits, risks, alternatives and complications were also discussed. The patient understands and wishes to proceed with the procedure. Written consent was obtained. Ultrasound was performed to localize and  mark an adequate pocket of fluid in the right chest. The area was then prepped and draped in the normal sterile fashion. 1% Lidocaine was used for local anesthesia. Under ultrasound guidance a 6 Fr Safe-T-Centesis catheter was introduced. Thoracentesis was performed. The catheter was removed and a dressing applied. FINDINGS: A total of approximately 1.7 L of blood-tinged fluid was removed. IMPRESSION: Successful ultrasound guided right thoracentesis yielding 1.7 L of pleural fluid. Read by: Ascencion Dike PA-C Electronically Signed   By: Ruthann Cancer MD   On: 04/21/2020 16:49     Assessment and plan- Patient is a 75 y.o. male with stage IV extensive stage small cell lung cancer with pleural and bone metastases here for on treatment assessment prior to cycle 2 of carbo etoposide Tecentriq chemotherapy  Counts okay to proceed with cycle 2 of carbo etoposide Tecentriq chemotherapy on day 1 today and he will receive etoposide on day 2 and day 3.  He receives [place] at home on day 4.  Overall he has tolerated chemotherapy well without any significant side effects.  Repeat CT chest abdomen pelvis with contrast in 2 weeks to assess interim response to treatment.  I will see him back in 3 weeks for cycle 3 of carbo etoposide Tecentriq chemotherapy.  Bone metastases: Dental clearance for Zometa has been obtained and he will receive Zometa today.  Neoplasm related pain: Currently on as needed morphine.  He also has as needed Ativan for anxiety.   Visit Diagnosis 1. Small cell lung cancer, right ([place])   2. Encounter for antineoplastic chemotherapy   3. Encounter for antineoplastic immunotherapy   4. Encounter for monitoring zoledronic acid therapy   5. Bone metastases ([place])      Dr. Randa Evens, MD, MPH [facility] [phone] 05/15/2020 10:15 AM

## 2020-05-16 ENCOUNTER — Inpatient Hospital Stay: Payer: Managed Care, Other (non HMO)

## 2020-05-16 VITALS — BP 128/73 | HR 80 | Temp 98.0°F | Resp 18

## 2020-05-16 DIAGNOSIS — C3491 Malignant neoplasm of unspecified part of right bronchus or lung: Secondary | ICD-10-CM

## 2020-05-16 DIAGNOSIS — Z5112 Encounter for antineoplastic immunotherapy: Secondary | ICD-10-CM | POA: Diagnosis not present

## 2020-05-16 MED ORDER — SODIUM CHLORIDE 0.9 % IV SOLN
100.0000 mg/m2 | Freq: Once | INTRAVENOUS | Status: AC
  Administered 2020-05-16: 280 mg via INTRAVENOUS
  Filled 2020-05-16: qty 14

## 2020-05-16 MED ORDER — HEPARIN SOD (PORK) LOCK FLUSH 100 UNIT/ML IV SOLN
500.0000 [IU] | Freq: Once | INTRAVENOUS | Status: AC | PRN
  Administered 2020-05-16: 500 [IU]
  Filled 2020-05-16: qty 5

## 2020-05-16 MED ORDER — HEPARIN SOD (PORK) LOCK FLUSH 100 UNIT/ML IV SOLN
INTRAVENOUS | Status: AC
  Filled 2020-05-16: qty 5

## 2020-05-16 MED ORDER — SODIUM CHLORIDE 0.9 % IV SOLN
Freq: Once | INTRAVENOUS | Status: AC
  Filled 2020-05-16: qty 250

## 2020-05-16 MED ORDER — SODIUM CHLORIDE 0.9 % IV SOLN
10.0000 mg | Freq: Once | INTRAVENOUS | Status: AC
  Administered 2020-05-16: 10 mg via INTRAVENOUS
  Filled 2020-05-16: qty 10

## 2020-05-16 MED ORDER — SODIUM CHLORIDE 0.9% FLUSH
10.0000 mL | INTRAVENOUS | Status: DC | PRN
  Filled 2020-05-16: qty 10

## 2020-05-17 ENCOUNTER — Inpatient Hospital Stay: Payer: Managed Care, Other (non HMO)

## 2020-05-17 VITALS — BP 147/64 | HR 78 | Temp 98.4°F | Resp 20

## 2020-05-17 DIAGNOSIS — C3491 Malignant neoplasm of unspecified part of right bronchus or lung: Secondary | ICD-10-CM

## 2020-05-17 DIAGNOSIS — Z5112 Encounter for antineoplastic immunotherapy: Secondary | ICD-10-CM | POA: Diagnosis not present

## 2020-05-17 MED ORDER — SODIUM CHLORIDE 0.9 % IV SOLN
Freq: Once | INTRAVENOUS | Status: AC
  Filled 2020-05-17: qty 250

## 2020-05-17 MED ORDER — ETOPOSIDE CHEMO INJECTION 1 GM/50ML
100.0000 mg/m2 | Freq: Once | INTRAVENOUS | Status: AC
  Administered 2020-05-17: 280 mg via INTRAVENOUS
  Filled 2020-05-17: qty 14

## 2020-05-17 MED ORDER — SODIUM CHLORIDE 0.9 % IV SOLN
10.0000 mg | Freq: Once | INTRAVENOUS | Status: AC
  Administered 2020-05-17: 10 mg via INTRAVENOUS
  Filled 2020-05-17: qty 10

## 2020-05-26 NOTE — Progress Notes (Signed)
Patient did not join virtual visit.

## 2020-05-30 ENCOUNTER — Ambulatory Visit: Payer: Managed Care, Other (non HMO)

## 2020-06-01 ENCOUNTER — Telehealth: Payer: Self-pay | Admitting: Oncology

## 2020-06-01 NOTE — Telephone Encounter (Signed)
Spoke with patient about scheduled CT scan on 4/28. Gave patient instructions and he is agreeable.

## 2020-06-05 ENCOUNTER — Inpatient Hospital Stay (HOSPITAL_BASED_OUTPATIENT_CLINIC_OR_DEPARTMENT_OTHER): Payer: Managed Care, Other (non HMO) | Admitting: Oncology

## 2020-06-05 ENCOUNTER — Encounter: Payer: Self-pay | Admitting: Oncology

## 2020-06-05 ENCOUNTER — Inpatient Hospital Stay: Payer: Managed Care, Other (non HMO) | Attending: Oncology

## 2020-06-05 ENCOUNTER — Other Ambulatory Visit: Payer: Self-pay

## 2020-06-05 ENCOUNTER — Inpatient Hospital Stay: Payer: Managed Care, Other (non HMO)

## 2020-06-05 VITALS — BP 129/98 | HR 67 | Temp 98.1°F | Resp 16 | Wt 323.0 lb

## 2020-06-05 DIAGNOSIS — C7951 Secondary malignant neoplasm of bone: Secondary | ICD-10-CM | POA: Diagnosis not present

## 2020-06-05 DIAGNOSIS — C3491 Malignant neoplasm of unspecified part of right bronchus or lung: Secondary | ICD-10-CM | POA: Diagnosis not present

## 2020-06-05 DIAGNOSIS — F17210 Nicotine dependence, cigarettes, uncomplicated: Secondary | ICD-10-CM | POA: Insufficient documentation

## 2020-06-05 DIAGNOSIS — Z5112 Encounter for antineoplastic immunotherapy: Secondary | ICD-10-CM | POA: Diagnosis present

## 2020-06-05 DIAGNOSIS — Z836 Family history of other diseases of the respiratory system: Secondary | ICD-10-CM | POA: Diagnosis not present

## 2020-06-05 DIAGNOSIS — Z7289 Other problems related to lifestyle: Secondary | ICD-10-CM | POA: Insufficient documentation

## 2020-06-05 DIAGNOSIS — C3411 Malignant neoplasm of upper lobe, right bronchus or lung: Secondary | ICD-10-CM | POA: Insufficient documentation

## 2020-06-05 DIAGNOSIS — Z79899 Other long term (current) drug therapy: Secondary | ICD-10-CM | POA: Diagnosis not present

## 2020-06-05 DIAGNOSIS — Z5111 Encounter for antineoplastic chemotherapy: Secondary | ICD-10-CM | POA: Insufficient documentation

## 2020-06-05 DIAGNOSIS — F419 Anxiety disorder, unspecified: Secondary | ICD-10-CM | POA: Insufficient documentation

## 2020-06-05 DIAGNOSIS — G893 Neoplasm related pain (acute) (chronic): Secondary | ICD-10-CM

## 2020-06-05 LAB — COMPREHENSIVE METABOLIC PANEL
ALT: 36 U/L (ref 0–44)
AST: 27 U/L (ref 15–41)
Albumin: 3.4 g/dL — ABNORMAL LOW (ref 3.5–5.0)
Alkaline Phosphatase: 94 U/L (ref 38–126)
Anion gap: 7 (ref 5–15)
BUN: 9 mg/dL (ref 8–23)
CO2: 23 mmol/L (ref 22–32)
Calcium: 8.3 mg/dL — ABNORMAL LOW (ref 8.9–10.3)
Chloride: 107 mmol/L (ref 98–111)
Creatinine, Ser: 0.6 mg/dL — ABNORMAL LOW (ref 0.61–1.24)
GFR, Estimated: >60 mL/min (ref >60)
Glucose, Bld: 106 mg/dL — ABNORMAL HIGH (ref 70–99)
Potassium: 4.1 mmol/L (ref 3.5–5.1)
Sodium: 137 mmol/L (ref 135–145)
Total Bilirubin: 0.5 mg/dL (ref 0.3–1.2)
Total Protein: 6.7 g/dL (ref 6.5–8.1)

## 2020-06-05 LAB — CBC WITH DIFFERENTIAL/PLATELET
Abs Immature Granulocytes: 0.52 10*3/uL — ABNORMAL HIGH (ref 0.00–0.07)
Basophils Absolute: 0.1 10*3/uL (ref 0.0–0.1)
Basophils Relative: 1 %
Eosinophils Absolute: 0.1 10*3/uL (ref 0.0–0.5)
Eosinophils Relative: 0 %
HCT: 44 % (ref 39.0–52.0)
Hemoglobin: 15 g/dL (ref 13.0–17.0)
Immature Granulocytes: 4 %
Lymphocytes Relative: 15 %
Lymphs Abs: 2 10*3/uL (ref 0.7–4.0)
MCH: 31.6 pg (ref 26.0–34.0)
MCHC: 34.1 g/dL (ref 30.0–36.0)
MCV: 92.8 fL (ref 80.0–100.0)
Monocytes Absolute: 1.2 10*3/uL — ABNORMAL HIGH (ref 0.1–1.0)
Monocytes Relative: 9 %
Neutro Abs: 9.5 10*3/uL — ABNORMAL HIGH (ref 1.7–7.7)
Neutrophils Relative %: 71 %
Platelets: 236 10*3/uL (ref 150–400)
RBC: 4.74 MIL/uL (ref 4.22–5.81)
RDW: 15.9 % — ABNORMAL HIGH (ref 11.5–15.5)
WBC: 13.4 10*3/uL — ABNORMAL HIGH (ref 4.0–10.5)
nRBC: 0.2 % (ref 0.0–0.2)

## 2020-06-05 MED ORDER — SODIUM CHLORIDE 0.9 % IV SOLN
100.0000 mg/m2 | Freq: Once | INTRAVENOUS | Status: AC
  Administered 2020-06-05: 280 mg via INTRAVENOUS
  Filled 2020-06-05: qty 14

## 2020-06-05 MED ORDER — ATEZOLIZUMAB CHEMO INJECTION 1200 MG/20ML
1200.0000 mg | Freq: Once | INTRAVENOUS | Status: AC
  Administered 2020-06-05: 1200 mg via INTRAVENOUS
  Filled 2020-06-05: qty 20

## 2020-06-05 MED ORDER — PALONOSETRON HCL INJECTION 0.25 MG/5ML
0.2500 mg | Freq: Once | INTRAVENOUS | Status: AC
  Administered 2020-06-05: 0.25 mg via INTRAVENOUS
  Filled 2020-06-05: qty 5

## 2020-06-05 MED ORDER — FOSAPREPITANT DIMEGLUMINE INJECTION 150 MG
150.0000 mg | Freq: Once | INTRAVENOUS | Status: AC
  Administered 2020-06-05: 150 mg via INTRAVENOUS
  Filled 2020-06-05: qty 150

## 2020-06-05 MED ORDER — SODIUM CHLORIDE 0.9 % IV SOLN
Freq: Once | INTRAVENOUS | Status: AC
  Filled 2020-06-05: qty 250

## 2020-06-05 MED ORDER — SODIUM CHLORIDE 0.9 % IV SOLN
750.0000 mg | Freq: Once | INTRAVENOUS | Status: AC
  Administered 2020-06-05: 750 mg via INTRAVENOUS
  Filled 2020-06-05: qty 75

## 2020-06-05 MED ORDER — HEPARIN SOD (PORK) LOCK FLUSH 100 UNIT/ML IV SOLN
INTRAVENOUS | Status: AC
  Filled 2020-06-05: qty 5

## 2020-06-05 MED ORDER — HEPARIN SOD (PORK) LOCK FLUSH 100 UNIT/ML IV SOLN
500.0000 [IU] | Freq: Once | INTRAVENOUS | Status: AC | PRN
  Administered 2020-06-05: 500 [IU]
  Filled 2020-06-05: qty 5

## 2020-06-05 MED ORDER — SODIUM CHLORIDE 0.9 % IV SOLN
10.0000 mg | Freq: Once | INTRAVENOUS | Status: AC
  Administered 2020-06-05: 10 mg via INTRAVENOUS
  Filled 2020-06-05: qty 10

## 2020-06-05 NOTE — Progress Notes (Signed)
Pt has some sob on exertion after walking. He uses cane when he goes upstairs to bed. When he wakes up at night his fingers are sometimes tingling and then he shakes them and it goes away. Eating and drinking good

## 2020-06-05 NOTE — Progress Notes (Signed)
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 Past medical history significant for COPD, GERD and obstructive sleep apnea. CT angio chest abdomen pelvis showed 6.4 x 3.7 cm right paratracheal adenopathy concerning for malignancy. 1.9 cm right hilar node. 12 mm anterior mediastinal lymph node. 2.8 cm right thyroid nodule. Several pleural-based masses concerning for metastatic disease. Large irregular opacity in the right upper lobe which may be malignancy versus postobstructive atelectasis. Lytic lesion noted in the manubrium consistent with metastatic disease.Moderate right-sided pleural effusion s/p thoracentesis.   Cytology showedPoorly differentiated carcinoma with neuroendocrine differentiation. Tumor cells were positive for pancytokeratin and CD56 but negative for TTF-1 p40 and synaptophysin. Chromogranin stain was also negative.  MRI brain was negative for metastatic disease  Interval history-patient reports doing well presently.  He has occasional exertional shortness of breath.  Denies any significant pain nausea vomiting or diarrhea.  ECOG PS- 1 Pain scale- 0   Review of systems- Review of Systems  Constitutional: Positive for malaise/fatigue. Negative for chills, fever and weight loss.  HENT: Negative for congestion, ear discharge and  nosebleeds.   Eyes: Negative for blurred vision.  Respiratory: Positive for shortness of breath. Negative for cough, hemoptysis, sputum production and wheezing.   Cardiovascular: Negative for chest pain, palpitations, orthopnea and claudication.  Gastrointestinal: Negative for abdominal pain, blood in stool, constipation, diarrhea, heartburn, melena, nausea and vomiting.  Genitourinary: Negative for dysuria, flank pain, frequency, hematuria and urgency.  Musculoskeletal: Negative for back pain, joint pain and myalgias.  Skin: Negative for rash.  Neurological: Negative for dizziness, tingling, focal weakness, seizures, weakness and headaches.  Endo/Heme/Allergies: Does not bruise/bleed easily.  Psychiatric/Behavioral: Negative for depression and suicidal ideas. The patient does not have insomnia.       Allergies  Allergen Reactions  . Cephalexin Rash     Past Medical History:  Diagnosis Date  . Adenomatous polyps   . Barrett's esophagus   . Colon polyp   . COPD (chronic obstructive pulmonary disease) ([place])    uses inhaler, "i may have COPD"  . Dyspnea   . Eye injury   . GERD (gastroesophageal reflux disease)   . History of gonorrhea   . History of pneumonia   . Obesity   . Pneumonia   . Reflux esophagitis   . Sleep apnea      Past Surgical History:  Procedure Laterality Date  . CATARACT EXTRACTION Left   . COLONOSCOPY    . COLONOSCOPY WITH PROPOFOL N/A 05/19/2015   Procedure: COLONOSCOPY WITH PROPOFOL;  Surgeon: [name], MD;  Location: [facility] ENDOSCOPY;  Service: Endoscopy;  Laterality: N/A;  . COLONOSCOPY WITH PROPOFOL N/A 09/11/2018   Procedure: COLONOSCOPY WITH PROPOFOL;  Surgeon: [name], MD;  Location: [facility] ENDOSCOPY;  Service: Endoscopy;  Laterality: N/A;  . ESOPHAGOGASTRODUODENOSCOPY (EGD) WITH PROPOFOL N/A 05/19/2015   Procedure: ESOPHAGOGASTRODUODENOSCOPY (EGD) WITH PROPOFOL;  Surgeon: [name], MD;  Location:  [place] ENDOSCOPY;  Service:  Endoscopy;  Laterality: N/A;  . ESOPHAGOGASTRODUODENOSCOPY (EGD) WITH PROPOFOL N/A 10/11/2016   Procedure: ESOPHAGOGASTRODUODENOSCOPY (EGD) WITH PROPOFOL;  Surgeon: [name], MD;  Location: [facility] ENDOSCOPY;  Service: Endoscopy;  Laterality: N/A;  . ESOPHAGOGASTRODUODENOSCOPY (EGD) WITH PROPOFOL N/A 01/01/2018   Procedure: ESOPHAGOGASTRODUODENOSCOPY (EGD) WITH PROPOFOL;  Surgeon: [name], MD;  Location: [facility] ENDOSCOPY;  Service: Endoscopy;  Laterality: N/A;  . ESOPHAGOGASTRODUODENOSCOPY (EGD) WITH PROPOFOL N/A 09/11/2018   Procedure: ESOPHAGOGASTRODUODENOSCOPY (EGD) WITH PROPOFOL;  Surgeon: [name], MD;  Location: [facility] ENDOSCOPY;  Service: Endoscopy;  Laterality: N/A;  . EYE SURGERY    . IR IMAGING GUIDED PORT INSERTION  04/21/2020    Social History   Socioeconomic History  . Marital status: Married    Spouse name: Not on file  . Number of children: Not on file  . Years of education: Not on file  . Highest education level: Not on file  Occupational History  . Not on file  Tobacco Use  . Smoking status: Current Every Day Smoker    Packs/day: 1.50    Years: 35.00    Pack years: 52.50    Types: Cigarettes, Cigars    Last attempt to quit: 03/04/2015    Years since quitting: 5.2  . Smokeless tobacco: Never Used  Vaping Use  . Vaping Use: Every day  . Substances: Flavoring  Substance and Sexual Activity  . Alcohol use: Yes    Alcohol/week: 8.0 standard drinks    Types: 8 Cans of beer per week    Comment: Occassionally on weekends   . Drug use: No  . Sexual activity: Not on file  Other Topics Concern  . Not on file  Social History Narrative  . Not on file   Social Determinants of Health   Financial Resource Strain: Not on file  Food Insecurity: Not on file  Transportation Needs: Not on file  Physical Activity: Not on file  Stress: Not on file  Social Connections: Not on file  Intimate Partner Violence: Not on file    Family History  Problem  Relation Age of Onset  . Emphysema Mother      Current Outpatient Medications:  .  albuterol (PROVENTIL HFA;VENTOLIN HFA) 108 (90 Base) MCG/ACT inhaler, 2 puffs q.i.d. p.r.n. short of breath, wheezing, or cough, Disp: , Rfl:  .  lidocaine-prilocaine (EMLA) cream, Apply to affected area once, Disp: 30 g, Rfl: 3 .  LORazepam (ATIVAN) 0.5 MG tablet, Take 1 tablet (0.5 mg total) by mouth 3 (three) times daily as needed (Nausea or vomiting). (Patient not taking: No sig reported), Disp: 30 tablet, Rfl: 0 .  Morphine Sulfate, Concentrate, 5 MG/0.25ML SOLN, Take 5 mg by mouth every 4 (four) hours as needed. For dyspnea (Patient not taking: No sig reported), Disp: 30 mL, Rfl: 0 .  ondansetron (ZOFRAN) 8 MG tablet, Take 1 tablet (8 mg total) by mouth 2 (two) times daily as needed for refractory nausea / vomiting. Start on day 3 after carboplatin chemo. (Patient not taking: No sig reported), Disp: 30 tablet, Rfl: 1 .  prochlorperazine (COMPAZINE) 10 MG tablet, Take 1 tablet (10 mg total) by mouth every 6 (six) hours as needed (Nausea or vomiting). (Patient not taking: No sig reported), Disp: 30 tablet, Rfl: 1  Physical exam:  Vitals:   06/05/20 0924  BP: (!) 129/98  Pulse: 67  Resp: 16  Temp: 98.1 F (36.7 C)  TempSrc: Oral  Weight: (!) 323 lb (146.5 kg)  Physical Exam Cardiovascular:     Rate and Rhythm: Normal rate and regular rhythm.     Heart sounds: Normal heart sounds.  Pulmonary:     Effort: Pulmonary effort is normal.     Breath sounds: Normal breath sounds.  Skin:    General: Skin is warm and dry.  Neurological:     Mental Status: He is alert and oriented to person, place, and time.      CMP Latest Ref Rng & Units 06/05/2020  Glucose 70 - 99 mg/dL 106(H)  BUN 8 - 23 mg/dL 9  Creatinine 0.61 - 1.24 mg/dL 0.60(L)  Sodium 135 - 145 mmol/L 137  Potassium 3.5 - 5.1 mmol/L 4.1  Chloride 98 - 111 mmol/L 107  CO2 22 - 32 mmol/L 23  Calcium 8.9 - 10.3 mg/dL 8.3(L)  Total Protein  6.5 - 8.1 g/dL 6.7  Total Bilirubin 0.3 - 1.2 mg/dL 0.5  Alkaline Phos 38 - 126 U/L 94  AST 15 - 41 U/L 27  ALT 0 - 44 U/L 36   CBC Latest Ref Rng & Units 06/05/2020  WBC 4.0 - 10.5 K/uL 13.4(H)  Hemoglobin 13.0 - 17.0 g/dL 15.0  Hematocrit 39.0 - 52.0 % 44.0  Platelets 150 - 400 K/uL 236                 Assessment and plan- Patient is a 75 y.o. 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  He is here for on treatment assessment prior to cycle 3 of carbo etoposide Tecentriq chemotherapy  Interim scans could not be done after 2 cycles of chemotherapy as in his insurance initially declined the scans.  It is now scheduled for 06/22/2020.  Counts are okay to proceed with cycle 3 of carbo etoposide Tecentriq chemotherapy today.  He will receive etoposide on day 2 and day 3 and receives Udenyca at home on day 4.  Bone metastases: He will receive Zometa next week  Neoplasm related pain: Continue as needed morphine solution  Anxiety: Continue as needed Ativan  Hypocalcemia: I have asked him to take calcium and vitamin D tablets so that his calcium kidney more than 8.5 for giving Zometa Visit Diagnosis 1. Encounter for antineoplastic chemotherapy   2. Encounter for antineoplastic immunotherapy   3. Small cell lung cancer, right ([place])   4. Hypocalcemia   5. Neoplasm related pain      Dr. Randa Evens, MD, MPH [facility] at [facility] [phone] 06/05/2020 9:21 AM

## 2020-06-06 ENCOUNTER — Inpatient Hospital Stay: Payer: Managed Care, Other (non HMO)

## 2020-06-06 VITALS — BP 127/65 | HR 93 | Temp 96.3°F | Wt 325.4 lb

## 2020-06-06 DIAGNOSIS — C3491 Malignant neoplasm of unspecified part of right bronchus or lung: Secondary | ICD-10-CM

## 2020-06-06 DIAGNOSIS — Z5112 Encounter for antineoplastic immunotherapy: Secondary | ICD-10-CM | POA: Diagnosis not present

## 2020-06-06 MED ORDER — SODIUM CHLORIDE 0.9 % IV SOLN
Freq: Once | INTRAVENOUS | Status: AC
  Filled 2020-06-06: qty 250

## 2020-06-06 MED ORDER — HEPARIN SOD (PORK) LOCK FLUSH 100 UNIT/ML IV SOLN
500.0000 [IU] | Freq: Once | INTRAVENOUS | Status: AC | PRN
  Administered 2020-06-06: 500 [IU]
  Filled 2020-06-06: qty 5

## 2020-06-06 MED ORDER — SODIUM CHLORIDE 0.9% FLUSH
10.0000 mL | INTRAVENOUS | Status: DC | PRN
  Administered 2020-06-06: 10 mL
  Filled 2020-06-06: qty 10

## 2020-06-06 MED ORDER — SODIUM CHLORIDE 0.9 % IV SOLN
100.0000 mg/m2 | Freq: Once | INTRAVENOUS | Status: AC
  Administered 2020-06-06: 280 mg via INTRAVENOUS
  Filled 2020-06-06: qty 14

## 2020-06-06 MED ORDER — HEPARIN SOD (PORK) LOCK FLUSH 100 UNIT/ML IV SOLN
INTRAVENOUS | Status: AC
  Filled 2020-06-06: qty 5

## 2020-06-06 MED ORDER — SODIUM CHLORIDE 0.9 % IV SOLN
10.0000 mg | Freq: Once | INTRAVENOUS | Status: AC
  Administered 2020-06-06: 10 mg via INTRAVENOUS
  Filled 2020-06-06: qty 10

## 2020-06-07 ENCOUNTER — Inpatient Hospital Stay: Payer: Managed Care, Other (non HMO)

## 2020-06-07 VITALS — BP 124/80 | HR 98 | Temp 96.9°F | Resp 16

## 2020-06-07 DIAGNOSIS — Z5112 Encounter for antineoplastic immunotherapy: Secondary | ICD-10-CM | POA: Diagnosis not present

## 2020-06-07 DIAGNOSIS — C3491 Malignant neoplasm of unspecified part of right bronchus or lung: Secondary | ICD-10-CM

## 2020-06-07 MED ORDER — SODIUM CHLORIDE 0.9 % IV SOLN
10.0000 mg | Freq: Once | INTRAVENOUS | Status: AC
  Administered 2020-06-07: 10 mg via INTRAVENOUS
  Filled 2020-06-07: qty 10

## 2020-06-07 MED ORDER — SODIUM CHLORIDE 0.9 % IV SOLN
100.0000 mg/m2 | Freq: Once | INTRAVENOUS | Status: AC
  Administered 2020-06-07: 280 mg via INTRAVENOUS
  Filled 2020-06-07: qty 14

## 2020-06-07 MED ORDER — SODIUM CHLORIDE 0.9 % IV SOLN
Freq: Once | INTRAVENOUS | Status: AC
Start: 2020-06-07 — End: 2020-06-07
  Filled 2020-06-07: qty 250

## 2020-06-07 MED ORDER — HEPARIN SOD (PORK) LOCK FLUSH 100 UNIT/ML IV SOLN
500.0000 [IU] | Freq: Once | INTRAVENOUS | Status: AC | PRN
  Administered 2020-06-07: 500 [IU]
  Filled 2020-06-07: qty 5

## 2020-06-07 MED ORDER — HEPARIN SOD (PORK) LOCK FLUSH 100 UNIT/ML IV SOLN
INTRAVENOUS | Status: AC
  Filled 2020-06-07: qty 5

## 2020-06-12 ENCOUNTER — Inpatient Hospital Stay: Payer: Managed Care, Other (non HMO)

## 2020-06-12 ENCOUNTER — Other Ambulatory Visit: Payer: Self-pay

## 2020-06-12 VITALS — BP 124/63 | HR 66 | Temp 98.8°F | Resp 18

## 2020-06-12 DIAGNOSIS — Z5112 Encounter for antineoplastic immunotherapy: Secondary | ICD-10-CM | POA: Diagnosis not present

## 2020-06-12 DIAGNOSIS — C3491 Malignant neoplasm of unspecified part of right bronchus or lung: Secondary | ICD-10-CM

## 2020-06-12 DIAGNOSIS — D751 Secondary polycythemia: Secondary | ICD-10-CM

## 2020-06-12 LAB — CBC WITH DIFFERENTIAL/PLATELET
Abs Immature Granulocytes: 0.29 10*3/uL — ABNORMAL HIGH (ref 0.00–0.07)
Basophils Absolute: 0.2 10*3/uL — ABNORMAL HIGH (ref 0.0–0.1)
Basophils Relative: 1 %
Eosinophils Absolute: 0 10*3/uL (ref 0.0–0.5)
Eosinophils Relative: 0 %
HCT: 40.6 % (ref 39.0–52.0)
Hemoglobin: 14.1 g/dL (ref 13.0–17.0)
Immature Granulocytes: 1 %
Lymphocytes Relative: 9 %
Lymphs Abs: 1.9 10*3/uL (ref 0.7–4.0)
MCH: 32.2 pg (ref 26.0–34.0)
MCHC: 34.7 g/dL (ref 30.0–36.0)
MCV: 92.7 fL (ref 80.0–100.0)
Monocytes Absolute: 0.5 10*3/uL (ref 0.1–1.0)
Monocytes Relative: 3 %
Neutro Abs: 17.3 10*3/uL — ABNORMAL HIGH (ref 1.7–7.7)
Neutrophils Relative %: 86 %
Platelets: 153 10*3/uL (ref 150–400)
RBC: 4.38 MIL/uL (ref 4.22–5.81)
RDW: 15.9 % — ABNORMAL HIGH (ref 11.5–15.5)
Smear Review: NORMAL
WBC: 20.2 10*3/uL — ABNORMAL HIGH (ref 4.0–10.5)
nRBC: 0 % (ref 0.0–0.2)

## 2020-06-12 LAB — COMPREHENSIVE METABOLIC PANEL
ALT: 52 U/L — ABNORMAL HIGH (ref 0–44)
AST: 28 U/L (ref 15–41)
Albumin: 3.4 g/dL — ABNORMAL LOW (ref 3.5–5.0)
Alkaline Phosphatase: 154 U/L — ABNORMAL HIGH (ref 38–126)
Anion gap: 9 (ref 5–15)
BUN: 10 mg/dL (ref 8–23)
CO2: 24 mmol/L (ref 22–32)
Calcium: 8.9 mg/dL (ref 8.9–10.3)
Chloride: 102 mmol/L (ref 98–111)
Creatinine, Ser: 0.65 mg/dL (ref 0.61–1.24)
GFR, Estimated: >60 mL/min (ref >60)
Glucose, Bld: 85 mg/dL (ref 70–99)
Potassium: 4.2 mmol/L (ref 3.5–5.1)
Sodium: 135 mmol/L (ref 135–145)
Total Bilirubin: 0.8 mg/dL (ref 0.3–1.2)
Total Protein: 6.6 g/dL (ref 6.5–8.1)

## 2020-06-12 MED ORDER — ZOLEDRONIC ACID 4 MG/5ML IV CONC
4.0000 mg | Freq: Once | INTRAVENOUS | Status: AC
Start: 2020-06-12 — End: 2020-06-12
  Administered 2020-06-12: 4 mg via INTRAVENOUS
  Filled 2020-06-12: qty 5

## 2020-06-12 MED ORDER — HEPARIN SOD (PORK) LOCK FLUSH 100 UNIT/ML IV SOLN
500.0000 [IU] | Freq: Once | INTRAVENOUS | Status: AC
  Administered 2020-06-12: 500 [IU] via INTRAVENOUS
  Filled 2020-06-12: qty 5

## 2020-06-12 MED ORDER — ZOLEDRONIC ACID 4 MG/100ML IV SOLN: 4.0000 mg | Freq: Once | INTRAVENOUS | Status: DC

## 2020-06-12 MED ORDER — HEPARIN SOD (PORK) LOCK FLUSH 100 UNIT/ML IV SOLN
INTRAVENOUS | Status: AC
  Filled 2020-06-12: qty 5

## 2020-06-12 MED ORDER — SODIUM CHLORIDE 0.9% FLUSH
10.0000 mL | Freq: Once | INTRAVENOUS | Status: AC
Start: 2020-06-12 — End: 2020-06-12
  Administered 2020-06-12: 10 mL via INTRAVENOUS
  Filled 2020-06-12: qty 10

## 2020-06-12 MED ORDER — SODIUM CHLORIDE 0.9 % IV SOLN
Freq: Once | INTRAVENOUS | Status: AC
  Filled 2020-06-12: qty 250

## 2020-06-12 NOTE — Progress Notes (Signed)
Pt received zometa infusion in clinic today. Tolerated well. No complaints at d/c.

## 2020-06-22 ENCOUNTER — Ambulatory Visit
Admission: RE | Admit: 2020-06-22 | Discharge: 2020-06-22 | Disposition: A | Discharge status: Home / Self Care | Payer: Managed Care, Other (non HMO) | Source: Ambulatory Visit | Attending: Oncology | Admitting: Oncology

## 2020-06-22 ENCOUNTER — Other Ambulatory Visit: Payer: Self-pay

## 2020-06-22 DIAGNOSIS — C3491 Malignant neoplasm of unspecified part of right bronchus or lung: Secondary | ICD-10-CM | POA: Insufficient documentation

## 2020-06-22 HISTORY — DX: Unspecified asthma, uncomplicated: J45.909

## 2020-06-22 MED ORDER — IOHEXOL 300 MG/ML  SOLN
125.0000 mL | Freq: Once | INTRAMUSCULAR | Status: AC | PRN
  Administered 2020-06-22: 125 mL via INTRAVENOUS

## 2020-06-24 ENCOUNTER — Other Ambulatory Visit: Payer: Self-pay | Admitting: *Deleted

## 2020-06-24 DIAGNOSIS — C3491 Malignant neoplasm of unspecified part of right bronchus or lung: Secondary | ICD-10-CM

## 2020-06-26 ENCOUNTER — Inpatient Hospital Stay: Payer: Managed Care, Other (non HMO)

## 2020-06-26 ENCOUNTER — Encounter: Payer: Self-pay | Admitting: Oncology

## 2020-06-26 ENCOUNTER — Inpatient Hospital Stay: Payer: Managed Care, Other (non HMO) | Attending: Oncology

## 2020-06-26 ENCOUNTER — Inpatient Hospital Stay (HOSPITAL_BASED_OUTPATIENT_CLINIC_OR_DEPARTMENT_OTHER): Payer: Managed Care, Other (non HMO) | Admitting: Oncology

## 2020-06-26 VITALS — BP 134/72 | HR 64 | Temp 98.2°F | Resp 16 | Ht 73.0 in | Wt 333.0 lb

## 2020-06-26 DIAGNOSIS — C7951 Secondary malignant neoplasm of bone: Secondary | ICD-10-CM | POA: Diagnosis not present

## 2020-06-26 DIAGNOSIS — G4733 Obstructive sleep apnea (adult) (pediatric): Secondary | ICD-10-CM | POA: Insufficient documentation

## 2020-06-26 DIAGNOSIS — F17210 Nicotine dependence, cigarettes, uncomplicated: Secondary | ICD-10-CM | POA: Insufficient documentation

## 2020-06-26 DIAGNOSIS — E063 Autoimmune thyroiditis: Secondary | ICD-10-CM

## 2020-06-26 DIAGNOSIS — Z5111 Encounter for antineoplastic chemotherapy: Secondary | ICD-10-CM | POA: Insufficient documentation

## 2020-06-26 DIAGNOSIS — C3491 Malignant neoplasm of unspecified part of right bronchus or lung: Secondary | ICD-10-CM | POA: Diagnosis not present

## 2020-06-26 DIAGNOSIS — Z5112 Encounter for antineoplastic immunotherapy: Secondary | ICD-10-CM | POA: Diagnosis not present

## 2020-06-26 DIAGNOSIS — C3411 Malignant neoplasm of upper lobe, right bronchus or lung: Secondary | ICD-10-CM | POA: Insufficient documentation

## 2020-06-26 DIAGNOSIS — J449 Chronic obstructive pulmonary disease, unspecified: Secondary | ICD-10-CM | POA: Insufficient documentation

## 2020-06-26 DIAGNOSIS — Z79899 Other long term (current) drug therapy: Secondary | ICD-10-CM | POA: Insufficient documentation

## 2020-06-26 DIAGNOSIS — C782 Secondary malignant neoplasm of pleura: Secondary | ICD-10-CM | POA: Insufficient documentation

## 2020-06-26 LAB — CBC WITH DIFFERENTIAL/PLATELET
Abs Immature Granulocytes: 0.21 10*3/uL — ABNORMAL HIGH (ref 0.00–0.07)
Basophils Absolute: 0.1 10*3/uL (ref 0.0–0.1)
Basophils Relative: 1 %
Eosinophils Absolute: 0.1 10*3/uL (ref 0.0–0.5)
Eosinophils Relative: 1 %
HCT: 42.7 % (ref 39.0–52.0)
Hemoglobin: 14.3 g/dL (ref 13.0–17.0)
Immature Granulocytes: 2 %
Lymphocytes Relative: 13 %
Lymphs Abs: 1.4 10*3/uL (ref 0.7–4.0)
MCH: 32.2 pg (ref 26.0–34.0)
MCHC: 33.5 g/dL (ref 30.0–36.0)
MCV: 96.2 fL (ref 80.0–100.0)
Monocytes Absolute: 0.7 10*3/uL (ref 0.1–1.0)
Monocytes Relative: 7 %
Neutro Abs: 8.3 10*3/uL — ABNORMAL HIGH (ref 1.7–7.7)
Neutrophils Relative %: 76 %
Platelets: 274 10*3/uL (ref 150–400)
RBC: 4.44 MIL/uL (ref 4.22–5.81)
RDW: 17.8 % — ABNORMAL HIGH (ref 11.5–15.5)
WBC: 10.8 10*3/uL — ABNORMAL HIGH (ref 4.0–10.5)
nRBC: 0 % (ref 0.0–0.2)

## 2020-06-26 LAB — COMPREHENSIVE METABOLIC PANEL
ALT: 23 U/L (ref 0–44)
AST: 23 U/L (ref 15–41)
Albumin: 3.3 g/dL — ABNORMAL LOW (ref 3.5–5.0)
Alkaline Phosphatase: 78 U/L (ref 38–126)
Anion gap: 9 (ref 5–15)
BUN: 8 mg/dL (ref 8–23)
CO2: 22 mmol/L (ref 22–32)
Calcium: 8.3 mg/dL — ABNORMAL LOW (ref 8.9–10.3)
Chloride: 107 mmol/L (ref 98–111)
Creatinine, Ser: 0.66 mg/dL (ref 0.61–1.24)
GFR, Estimated: >60 mL/min (ref >60)
Glucose, Bld: 136 mg/dL — ABNORMAL HIGH (ref 70–99)
Potassium: 3.7 mmol/L (ref 3.5–5.1)
Sodium: 138 mmol/L (ref 135–145)
Total Bilirubin: 0.4 mg/dL (ref 0.3–1.2)
Total Protein: 6.5 g/dL (ref 6.5–8.1)

## 2020-06-26 LAB — TSH: TSH: 42.894 u[IU]/mL — ABNORMAL HIGH (ref 0.350–4.500)

## 2020-06-26 MED ORDER — SODIUM CHLORIDE 0.9 % IV SOLN
1200.0000 mg | Freq: Once | INTRAVENOUS | Status: AC
  Administered 2020-06-26: 1200 mg via INTRAVENOUS
  Filled 2020-06-26: qty 20

## 2020-06-26 MED ORDER — HEPARIN SOD (PORK) LOCK FLUSH 100 UNIT/ML IV SOLN
500.0000 [IU] | Freq: Once | INTRAVENOUS | Status: AC
Start: 2020-06-26 — End: 2020-06-26
  Administered 2020-06-26: 500 [IU] via INTRAVENOUS
  Filled 2020-06-26: qty 5

## 2020-06-26 MED ORDER — SODIUM CHLORIDE 0.9% FLUSH
10.0000 mL | Freq: Once | INTRAVENOUS | Status: AC
Start: 2020-06-26 — End: 2020-06-26
  Administered 2020-06-26: 10 mL via INTRAVENOUS
  Filled 2020-06-26: qty 10

## 2020-06-26 MED ORDER — SODIUM CHLORIDE 0.9 % IV SOLN
Freq: Once | INTRAVENOUS | Status: AC
Start: 2020-06-26 — End: 2020-06-26
  Filled 2020-06-26: qty 250

## 2020-06-26 MED ORDER — LEVOTHYROXINE SODIUM 75 MCG PO TABS: 75.0000 ug | ORAL_TABLET | Freq: Every day | ORAL | 2 refills | Status: DC

## 2020-06-26 MED ORDER — PALONOSETRON HCL INJECTION 0.25 MG/5ML
0.2500 mg | Freq: Once | INTRAVENOUS | Status: AC
Start: 2020-06-26 — End: 2020-06-26
  Administered 2020-06-26: 0.25 mg via INTRAVENOUS
  Filled 2020-06-26: qty 5

## 2020-06-26 MED ORDER — FOSAPREPITANT DIMEGLUMINE INJECTION 150 MG
150.0000 mg | Freq: Once | INTRAVENOUS | Status: AC
  Administered 2020-06-26: 150 mg via INTRAVENOUS
  Filled 2020-06-26: qty 150

## 2020-06-26 MED ORDER — SODIUM CHLORIDE 0.9 % IV SOLN
100.0000 mg/m2 | Freq: Once | INTRAVENOUS | Status: AC
  Administered 2020-06-26: 280 mg via INTRAVENOUS
  Filled 2020-06-26: qty 14

## 2020-06-26 MED ORDER — SODIUM CHLORIDE 0.9 % IV SOLN
750.0000 mg | Freq: Once | INTRAVENOUS | Status: AC
  Administered 2020-06-26: 750 mg via INTRAVENOUS
  Filled 2020-06-26: qty 75

## 2020-06-26 MED ORDER — SODIUM CHLORIDE 0.9 % IV SOLN
10.0000 mg | Freq: Once | INTRAVENOUS | Status: AC
  Administered 2020-06-26: 10 mg via INTRAVENOUS
  Filled 2020-06-26: qty 10

## 2020-06-26 MED ORDER — HEPARIN SOD (PORK) LOCK FLUSH 100 UNIT/ML IV SOLN
500.0000 [IU] | Freq: Once | INTRAVENOUS | Status: DC | PRN
  Filled 2020-06-26: qty 5

## 2020-06-26 NOTE — Progress Notes (Signed)
Pt thinks that today will be his last treatment of this regimen and then switch to immunology. He knows she will go over his scan with him

## 2020-06-26 NOTE — Patient Instructions (Signed)
[facility]  Discharge Instructions: Thank you for choosing [facility] to provide your oncology and hematology care.  If you have a lab appointment with the [facility], please go directly to the [facility] and check in at the registration area.  Wear comfortable clothing and clothing appropriate for easy access to any Portacath or PICC line.   We strive to give you quality time with your provider. You may need to reschedule your appointment if you arrive late (15 or more minutes).  Arriving late affects you and other patients whose appointments are after yours.  Also, if you miss three or more appointments without notifying the office, you may be dismissed from the clinic at the provider's discretion.      For prescription refill requests, have your pharmacy contact our office and allow 72 hours for refills to be completed.    Today you received the following chemotherapy and/or immunotherapy agents Tecentriq, Etoposide, Carboplatin      To help prevent nausea and vomiting after your treatment, we encourage you to take your nausea medication as directed.  BELOW ARE SYMPTOMS THAT SHOULD BE REPORTED IMMEDIATELY: . *FEVER GREATER THAN 100.4 F (38 C) OR HIGHER . *CHILLS OR SWEATING . *NAUSEA AND VOMITING THAT IS NOT CONTROLLED WITH YOUR NAUSEA MEDICATION . *UNUSUAL SHORTNESS OF BREATH . *UNUSUAL BRUISING OR BLEEDING . *URINARY PROBLEMS (pain or burning when urinating, or frequent urination) . *BOWEL PROBLEMS (unusual diarrhea, constipation, pain near the anus) . TENDERNESS IN MOUTH AND THROAT WITH OR WITHOUT PRESENCE OF ULCERS (sore throat, sores in mouth, or a toothache) . UNUSUAL RASH, SWELLING OR PAIN  . UNUSUAL VAGINAL DISCHARGE OR ITCHING   Items with * indicate a potential emergency and should be followed up as soon as possible or go to the Emergency Department if any problems should occur.  Please show the CHEMOTHERAPY ALERT  CARD or IMMUNOTHERAPY ALERT CARD at check-in to the Emergency Department and triage nurse.  Should you have questions after your visit or need to cancel or reschedule your appointment, please contact [facility]  [phone] and follow the prompts.  Office hours are 8:00 a.m. to 4:30 p.m. Monday - Friday. Please note that voicemails left after 4:00 p.m. may not be returned until the following business day.  We are closed weekends and major holidays. You have access to a nurse at all times for urgent questions. Please call the main number to the clinic [phone] and follow the prompts.  For any non-urgent questions, you may also contact your provider using MyChart. We now offer e-Visits for anyone 91 and older to request care online for non-urgent symptoms. For details visit [email or web address].   Also download the MyChart app! Go to the app store, search "MyChart", open the app, select [facility], and log in with your MyChart username and password.  Due to Covid, a mask is required upon entering the hospital/clinic. If you do not have a mask, one will be given to you upon arrival. For doctor visits, patients may have 1 support person aged 28 or older with them. For treatment visits, patients cannot have anyone with them due to current Covid guidelines and our immunocompromised population. Carboplatin injection What is this medicine? CARBOPLATIN (KAR boe pla tin) is a chemotherapy drug. It targets fast dividing cells, like cancer cells, and causes these cells to die. This medicine is used to treat ovarian cancer and many other cancers. This medicine may be  used for other purposes; ask your health care provider or pharmacist if you have questions. COMMON BRAND NAME(S): Paraplatin What should I tell my health care provider before I take this medicine? They need to know if you have any of these conditions:  blood disorders  hearing problems  kidney  disease  recent or ongoing radiation therapy  an unusual or allergic reaction to carboplatin, cisplatin, other chemotherapy, other medicines, foods, dyes, or preservatives  pregnant or trying to get pregnant  breast-feeding How should I use this medicine? This drug is usually given as an infusion into a vein. It is administered in a hospital or clinic by a specially trained health care professional. Talk to your pediatrician regarding the use of this medicine in children. Special care may be needed. Overdosage: If you think you have taken too much of this medicine contact a poison control center or emergency room at once. NOTE: This medicine is only for you. Do not share this medicine with others. What if I miss a dose? It is important not to miss a dose. Call your doctor or health care professional if you are unable to keep an appointment. What may interact with this medicine?  medicines for seizures  medicines to increase blood counts like filgrastim, pegfilgrastim, sargramostim  some antibiotics like amikacin, gentamicin, neomycin, streptomycin, tobramycin  vaccines Talk to your doctor or health care professional before taking any of these medicines:  acetaminophen  aspirin  ibuprofen  ketoprofen  naproxen This list may not describe all possible interactions. Give your health care provider a list of all the medicines, herbs, non-prescription drugs, or dietary supplements you use. Also tell them if you smoke, drink alcohol, or use illegal drugs. Some items may interact with your medicine. What should I watch for while using this medicine? Your condition will be monitored carefully while you are receiving this medicine. You will need important blood work done while you are taking this medicine. This drug may make you feel generally unwell. This is not uncommon, as chemotherapy can affect healthy cells as well as cancer cells. Report any side effects. Continue your course of  treatment even though you feel ill unless your doctor tells you to stop. In some cases, you may be given additional medicines to help with side effects. Follow all directions for their use. Call your doctor or health care professional for advice if you get a fever, chills or sore throat, or other symptoms of a cold or flu. Do not treat yourself. This drug decreases your body's ability to fight infections. Try to avoid being around people who are sick. This medicine may increase your risk to bruise or bleed. Call your doctor or health care professional if you notice any unusual bleeding. Be careful brushing and flossing your teeth or using a toothpick because you may get an infection or bleed more easily. If you have any dental work done, tell your dentist you are receiving this medicine. Avoid taking products that contain aspirin, acetaminophen, ibuprofen, naproxen, or ketoprofen unless instructed by your doctor. These medicines may hide a fever. Do not become pregnant while taking this medicine. Women should inform their doctor if they wish to become pregnant or think they might be pregnant. There is a potential for serious side effects to an unborn child. Talk to your health care professional or pharmacist for more information. Do not breast-feed an infant while taking this medicine. What side effects may I notice from receiving this medicine? Side effects that you should  report to your doctor or health care professional as soon as possible:  allergic reactions like skin rash, itching or hives, swelling of the face, lips, or tongue  signs of infection - fever or chills, cough, sore throat, pain or difficulty passing urine  signs of decreased platelets or bleeding - bruising, pinpoint red spots on the skin, black, tarry stools, nosebleeds  signs of decreased red blood cells - unusually weak or tired, fainting spells, lightheadedness  breathing problems  changes in hearing  changes in  vision  chest pain  high blood pressure  low blood counts - This drug may decrease the number of white blood cells, red blood cells and platelets. You may be at increased risk for infections and bleeding.  nausea and vomiting  pain, swelling, redness or irritation at the injection site  pain, tingling, numbness in the hands or feet  problems with balance, talking, walking  trouble passing urine or change in the amount of urine Side effects that usually do not require medical attention (report to your doctor or health care professional if they continue or are bothersome):  hair loss  loss of appetite  metallic taste in the mouth or changes in taste This list may not describe all possible side effects. Call your doctor for medical advice about side effects. You may report side effects to FDA at 1-800-FDA-1088. Where should I keep my medicine? This drug is given in a hospital or clinic and will not be stored at home. NOTE: This sheet is a summary. It may not cover all possible information. If you have questions about this medicine, talk to your doctor, pharmacist, or health care provider.  2021 Elsevier/Gold Standard (2007-05-19 14:38:05) Etoposide, VP-16 capsules What is this medicine? ETOPOSIDE, VP-16 (e toe POE side) is a chemotherapy drug. It is used to treat small cell lung cancer and other cancers. This medicine may be used for other purposes; ask your health care provider or pharmacist if you have questions. COMMON BRAND NAME(S): VePesid What should I tell my health care provider before I take this medicine? They need to know if you have any of these conditions:  infection  kidney disease  liver disease  low blood counts, like low white cell, platelet, or red cell counts  an unusual or allergic reaction to etoposide, other medicines, foods, dyes, or preservatives  pregnant or trying to get pregnant  breast-feeding How should I use this medicine? Take this  medicine by mouth with a glass of water. Follow the directions on the prescription label. Do not open, crush, or chew the capsules. It is advisable to wear gloves when handling this medicine. Take your medicine at regular intervals. Do not take it more often than directed. Do not stop taking except on your doctor's advice. Talk to your pediatrician regarding the use of this medicine in children. Special care may be needed. Overdosage: If you think you have taken too much of this medicine contact a poison control center or emergency room at once. NOTE: This medicine is only for you. Do not share this medicine with others. What if I miss a dose? If you miss a dose, take it as soon as you can. If it is almost time for your next dose, take only that dose. Do not take double or extra doses. What may interact with this medicine? This medicine may interact with the following medications:  cyclosporine  warfarin This list may not describe all possible interactions. Give your health care provider a list  of all the medicines, herbs, non-prescription drugs, or dietary supplements you use. Also tell them if you smoke, drink alcohol, or use illegal drugs. Some items may interact with your medicine. What should I watch for while using this medicine? Visit your doctor for checks on your progress. This drug may make you feel generally unwell. This is not uncommon, as chemotherapy can affect healthy cells as well as cancer cells. Report any side effects. Continue your course of treatment even though you feel ill unless your doctor tells you to stop. In some cases, you may be given additional medicines to help with side effects. Follow all directions for their use. Call your doctor or health care professional for advice if you get a fever, chills or sore throat, or other symptoms of a cold or flu. Do not treat yourself. This drug decreases your body's ability to fight infections. Try to avoid being around people who  are sick. This medicine may increase your risk to bruise or bleed. Call your doctor or health care professional if you notice any unusual bleeding. Talk to your doctor about your risk of cancer. You may be more at risk for certain types of cancers if you take this medicine. Do not become pregnant while taking this medicine or for at least 6 months after stopping it. Women should inform their doctor if they wish to become pregnant or think they might be pregnant. Women of child-bearing potential will need to have a negative pregnancy test before starting this medicine. There is a potential for serious side effects to an unborn child. Talk to your health care professional or pharmacist for more information. Do not breast-feed an infant while taking this medicine. Men must use a latex condom during sexual contact with a woman while taking this medicine and for at least 4 months after stopping it. A latex condom is needed even if you have had a vasectomy. Contact your doctor right away if your partner becomes pregnant. Do not donate sperm while taking this medicine and for 4 months after you stop taking this medicine. Men should inform their doctors if they wish to father a child. This medicine may lower sperm counts. What side effects may I notice from receiving this medicine? Side effects that you should report to your doctor or health care professional as soon as possible:  allergic reactions like skin rash, itching or hives, swelling of the face, lips, or tongue  low blood counts - this medicine may decrease the number of white blood cells, red blood cells, and platelets. You may be at increased risk for infections and bleeding  nausea, vomiting  redness, blistering, peeling or loosening of the skin, including inside the mouth  signs and symptoms of infection like fever; chills; cough; sore throat; pain or trouble passing urine  signs and symptoms of low red blood cells or anemia such as unusually  weak or tired; feeling faint or lightheaded; falls; breathing problems  unusual bruising or bleeding Side effects that usually do not require medical attention (report to your doctor or health care professional if they continue or are bothersome):  changes in taste  diarrhea  hair loss  loss of appetite  mouth sores This list may not describe all possible side effects. Call your doctor for medical advice about side effects. You may report side effects to FDA at 1-800-FDA-1088. Where should I keep my medicine? Keep out of the reach of children. Store in a refrigerator between 2 and 8 degrees C (36  and 46 degrees F). Do not freeze. Throw away any unused medicine after the expiration date. NOTE: This sheet is a summary. It may not cover all possible information. If you have questions about this medicine, talk to your doctor, pharmacist, or health care provider.  2021 Elsevier/Gold Standard (2018-04-08 16:44:55) Atezolizumab injection What is this medicine? ATEZOLIZUMAB (a te zoe LIZ ue mab) is a monoclonal antibody. It is used to treat bladder cancer (urothelial cancer), liver cancer, lung cancer, and melanoma. This medicine may be used for other purposes; ask your health care provider or pharmacist if you have questions. COMMON BRAND NAME(S): Tecentriq What should I tell my health care provider before I take this medicine? They need to know if you have any of these conditions:  autoimmune diseases like Crohn's disease, ulcerative colitis, or lupus  have had or planning to have an allogeneic stem cell transplant (uses someone else's stem cells)  history of organ transplant  history of radiation to the chest  nervous system problems like myasthenia gravis or Guillain-Barre syndrome  an unusual or allergic reaction to atezolizumab, other medicines, foods, dyes, or preservatives  pregnant or trying to get pregnant  breast-feeding How should I use this medicine? This medicine is  for infusion into a vein. It is given by a health care professional in a hospital or clinic setting. A special MedGuide will be given to you before each treatment. Be sure to read this information carefully each time. Talk to your pediatrician regarding the use of this medicine in children. Special care may be needed. Overdosage: If you think you have taken too much of this medicine contact a poison control center or emergency room at once. NOTE: This medicine is only for you. Do not share this medicine with others. What if I miss a dose? It is important not to miss your dose. Call your doctor or health care professional if you are unable to keep an appointment. What may interact with this medicine? Interactions have not been studied. This list may not describe all possible interactions. Give your health care provider a list of all the medicines, herbs, non-prescription drugs, or dietary supplements you use. Also tell them if you smoke, drink alcohol, or use illegal drugs. Some items may interact with your medicine. What should I watch for while using this medicine? Your condition will be monitored carefully while you are receiving this medicine. You may need blood work done while you are taking this medicine. Do not become pregnant while taking this medicine or for at least 5 months after stopping it. Women should inform their doctor if they wish to become pregnant or think they might be pregnant. There is a potential for serious side effects to an unborn child. Talk to your health care professional or pharmacist for more information. Do not breast-feed an infant while taking this medicine or for at least 5 months after the last dose. What side effects may I notice from receiving this medicine? Side effects that you should report to your doctor or health care professional as soon as possible:  allergic reactions like skin rash, itching or hives, swelling of the face, lips, or tongue  black, tarry  stools  bloody or watery diarrhea  breathing problems  changes in vision  chest pain or chest tightness  chills  facial flushing  fever  headache  signs and symptoms of high blood sugar such as dizziness; dry mouth; dry skin; fruity breath; nausea; stomach pain; increased hunger or thirst; increased urination  signs and symptoms of liver injury like dark yellow or brown urine; general ill feeling or flu-like symptoms; light-colored stools; loss of appetite; nausea; right upper belly pain; unusually weak or tired; yellowing of the eyes or skin  stomach pain  trouble passing urine or change in the amount of urine Side effects that usually do not require medical attention (report to your doctor or health care professional if they continue or are bothersome):  bone pain  cough  diarrhea  joint pain  muscle pain  muscle weakness  swelling of arms or legs  tiredness  weight loss This list may not describe all possible side effects. Call your doctor for medical advice about side effects. You may report side effects to FDA at 1-800-FDA-1088. Where should I keep my medicine? This drug is given in a hospital or clinic and will not be stored at home. NOTE: This sheet is a summary. It may not cover all possible information. If you have questions about this medicine, talk to your doctor, pharmacist, or health care provider.  2021 Elsevier/Gold Standard (2019-11-11 13:59:34)

## 2020-06-26 NOTE — Progress Notes (Signed)
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 Past medical history significant for COPD, GERD and obstructive sleep apnea. CT angio chest abdomen pelvis showed 6.4 x 3.7 cm right paratracheal adenopathy concerning for malignancy. 1.9 cm right hilar node. 12 mm anterior mediastinal lymph node. 2.8 cm right thyroid nodule. Several pleural-based masses concerning for metastatic disease. Large irregular opacity in the right upper lobe which may be malignancy versus postobstructive atelectasis. Lytic lesion noted in the manubrium consistent with metastatic disease.Moderate right-sided pleural effusion s/p thoracentesis.   Cytology showedPoorly differentiated carcinoma with neuroendocrine differentiation. Tumor cells were positive for pancytokeratin and CD56 but negative for TTF-1 p40 and synaptophysin. Chromogranin stain was also negative.  MRI brain was negative for metastatic disease  Interval history-patient reports tolerating chemotherapy well without any significant side effects.  He has baseline fatigue and some exertional shortness of breath which is essentially unchanged.  ECOG PS-oh1 Pain scale- 0  Review of systems- Review of Systems  Constitutional: Positive for malaise/fatigue. Negative for chills, fever and weight loss.  HENT: Negative for  congestion, ear discharge and nosebleeds.   Eyes: Negative for blurred vision.  Respiratory: Positive for shortness of breath. Negative for cough, hemoptysis, sputum production and wheezing.   Cardiovascular: Negative for chest pain, palpitations, orthopnea and claudication.  Gastrointestinal: Negative for abdominal pain, blood in stool, constipation, diarrhea, heartburn, melena, nausea and vomiting.  Genitourinary: Negative for dysuria, flank pain, frequency, hematuria and urgency.  Musculoskeletal: Negative for back pain, joint pain and myalgias.  Skin: Negative for rash.  Neurological: Negative for dizziness, tingling, focal weakness, seizures, weakness and headaches.  Endo/Heme/Allergies: Does not bruise/bleed easily.  Psychiatric/Behavioral: Negative for depression and suicidal ideas. The patient does not have insomnia.       Allergies  Allergen Reactions  . Cephalexin Rash     Past Medical History:  Diagnosis Date  . Adenomatous polyps   . Asthma   . Barrett's esophagus   . Colon polyp   . COPD (chronic obstructive pulmonary disease) ([place])    uses inhaler, "i may have COPD"  . Dyspnea   . Eye injury   . GERD (gastroesophageal reflux disease)   . History of gonorrhea   . History of pneumonia   . Lung cancer ([place])   . Obesity   . Pneumonia   . Reflux esophagitis   . Sleep apnea      Past Surgical History:  Procedure Laterality Date  . CATARACT EXTRACTION Left   . COLONOSCOPY    . COLONOSCOPY WITH PROPOFOL N/A 05/19/2015   Procedure: COLONOSCOPY WITH PROPOFOL;  Surgeon: [name], MD;  Location: [facility] ENDOSCOPY;  Service: Endoscopy;  Laterality: N/A;  . COLONOSCOPY WITH PROPOFOL N/A 09/11/2018   Procedure: COLONOSCOPY WITH PROPOFOL;  Surgeon: [name], MD;  Location: [facility] ENDOSCOPY;  Service: Endoscopy;  Laterality: N/A;  . ESOPHAGOGASTRODUODENOSCOPY (EGD) WITH PROPOFOL N/A 05/19/2015   Procedure: ESOPHAGOGASTRODUODENOSCOPY (  EGD) WITH PROPOFOL;   Surgeon: [name], MD;  Location: [facility] ENDOSCOPY;  Service: Endoscopy;  Laterality: N/A;  . ESOPHAGOGASTRODUODENOSCOPY (EGD) WITH PROPOFOL N/A 10/11/2016   Procedure: ESOPHAGOGASTRODUODENOSCOPY (EGD) WITH PROPOFOL;  Surgeon: [name], MD;  Location: [facility] ENDOSCOPY;  Service: Endoscopy;  Laterality: N/A;  . ESOPHAGOGASTRODUODENOSCOPY (EGD) WITH PROPOFOL N/A 01/01/2018   Procedure: ESOPHAGOGASTRODUODENOSCOPY (EGD) WITH PROPOFOL;  Surgeon: [name], MD;  Location: [facility] ENDOSCOPY;  Service: Endoscopy;  Laterality: N/A;  . ESOPHAGOGASTRODUODENOSCOPY (EGD) WITH PROPOFOL N/A 09/11/2018   Procedure: ESOPHAGOGASTRODUODENOSCOPY (EGD) WITH PROPOFOL;  Surgeon: [name], MD;  Location[facility] END[facility]COPY;  Service: Endoscopy;  Laterality: N/A;  . EYE SURGERY    . IR IMAGING GUIDED PORT INSERTION  04/21/2020    Social History   Socioeconomic History  . Marital status: Married    Spouse name: Not on file  . Number of children: Not on file  . Years of education: Not on file  . Highest education level: Not on file  Occupational History  . Not on file  Tobacco Use  . Smoking status: Current Every Day Smoker    Packs/day: 1.50    Years: 35.00    Pack years: 52.50    Types: Cigarettes, Cigars    Last attempt to quit: 03/04/2015    Years since quitting: 5.3  . Smokeless tobacco: Never Used  Vaping Use  . Vaping Use: Former  Substance and Sexual Activity  . Alcohol use: Not Currently    Comment: none or 3 weeks   . Drug use: No  . Sexual activity: Not on file  Other Topics Concern  . Not on file  Social History Narrative  . Not on file   Social Determinants of Health   Financial Resource Strain: Not on file  Food Insecurity: Not on file  Transportation Needs: Not on file  Physical Activity: Not on file  Stress: Not on file  Social Connections: Not on file  Intimate Partner Violence: Not on file    Family History  Problem Relation Age of Onset  . Emphysema  Mother      Current Outpatient Medications:  .  albuterol (PROVENTIL HFA;VENTOLIN HFA) 108 (90 Base) MCG/ACT inhaler, 2 puffs q.i.d. p.r.n. short of breath, wheezing, or cough, Disp: , Rfl:  .  lidocaine-prilocaine (EMLA) cream, Apply to affected area once, Disp: 30 g, Rfl: 3 .  LORazepam (ATIVAN) 0.5 MG tablet, Take 1 tablet (0.5 mg total) by mouth 3 (three) times daily as needed (Nausea or vomiting). (Patient not taking: No sig reported), Disp: 30 tablet, Rfl: 0 .  Morphine Sulfate, Concentrate, 5 MG/0.25ML SOLN, Take 5 mg by mouth every 4 (four) hours as needed. For dyspnea (Patient not taking: No sig reported), Disp: 30 mL, Rfl: 0 .  ondansetron (ZOFRAN) 8 MG tablet, Take 1 tablet (8 mg total) by mouth 2 (two) times daily as needed for refractory nausea / vomiting. Start on day 3 after carboplatin chemo. (Patient not taking: No sig reported), Disp: 30 tablet, Rfl: 1 .  prochlorperazine (COMPAZINE) 10 MG tablet, Take 1 tablet (10 mg total) by mouth every 6 (six) hours as needed (Nausea or vomiting). (Patient not taking: No sig reported), Disp: 30 tablet, Rfl: 1 No current facility-administered medications for this visit.  Facility-Administered Medications Ordered in Other Visits:  .  heparin lock flush 100 unit/mL, 500 Units, Intravenous, Once, Sindy Guadeloupe, MD  Physical exam:  Vitals:   06/26/20 0857  BP: 134/72  Pulse: 64  Resp: 16  Temp: 98.2 F (36.8 C)  TempSrc: Oral  Weight: (!) 333 lb (151 kg)  Height: 6\' 1"  (1.854 m)   Physical Exam Constitutional:      General: He is not in acute distress. Cardiovascular:     Rate and Rhythm: Normal rate and regular rhythm.     Heart sounds: Normal heart sounds.  Pulmonary:     Effort: Pulmonary effort is normal.     Breath sounds: Normal breath sounds.  Skin:    General: Skin is warm and dry.  Neurological:     Mental Status: He is alert and oriented to person, place, and time.      CMP Latest Ref Rng & Units 06/26/2020   Glucose 70 - 99 mg/dL 136(H)  BUN 8 - 23 mg/dL 8  Creatinine 0.61 - 1.24 mg/dL 0.66  Sodium 135 - 145 mmol/L 138  Potassium 3.5 - 5.1 mmol/L 3.7  Chloride 98 - 111 mmol/L 107  CO2 22 - 32 mmol/L 22  Calcium 8.9 - 10.3 mg/dL 8.3(L)  Total Protein 6.5 - 8.1 g/dL 6.5  Total Bilirubin 0.3 - 1.2 mg/dL 0.4  Alkaline Phos 38 - 126 U/L 78  AST 15 - 41 U/L 23  ALT 0 - 44 U/L 23   CBC Latest Ref Rng & Units 06/26/2020  WBC 4.0 - 10.5 K/uL 10.8(H)  Hemoglobin 13.0 - 17.0 g/dL 14.3  Hematocrit 39.0 - 52.0 % 42.7  Platelets 150 - 400 K/uL 274    No images are attached to the encounter.  CT CHEST ABDOMEN PELVIS W CONTRAST  Result Date: 06/22/2020 CLINICAL DATA:  Restaging lung cancer. EXAM: CT CHEST, ABDOMEN, AND PELVIS WITH CONTRAST TECHNIQUE: Multidetector CT imaging of the chest, abdomen and pelvis was performed following the standard protocol during bolus administration of intravenous contrast. CONTRAST:  128mL OMNIPAQUE IOHEXOL 300 MG/ML  SOLN COMPARISON:  03/30/2020. FINDINGS: CT CHEST FINDINGS Cardiovascular: Heart size appears within normal limits. No pericardial effusion. Mild aortic atherosclerosis. Coronary artery atherosclerotic calcifications. Mediastinum/Nodes: Diffusely enlarged thyroid gland is identified. Similar appearance of 2.7 cm right lobe of thyroid gland nodule, image 7/2. In the setting of significant comorbidities or limited life expectancy, no follow-up recommended (ref: J Am Coll Radiol. 2015 Feb;12(2): 143-50). No new discrete nodule noted. The trachea appears patent and is midline. Normal appearance of the esophagus. No enlarged axillary or supraclavicular lymph nodes. Right paratracheal lymph node measures 0.9 cm, image 23/5. Previously 2.1 cm. Lower right paratracheal lymph node has a short axis of 1.5 cm, image 28/2. Previously 3.8 cm. Previous index right hilar node has resolved in the interval. Previous index right pre-vascular lymph node has also resolved in the  interval. Lungs/Pleura: Decreased volume of right pleural effusion. The mass within the posterior right upper lobe measures 1.3 by 1.0 cm, image 25/2. Previously 3.9 x 2.4 cm. Index right middle lobe lung nodule measures 4 mm, image 101/4. Previously 1.2 cm. Musculoskeletal: There is increased sclerosis surrounding the FDG avid lytic lesion involving the sternal manubrium. This measures 2.6 cm, image 16/2. Previously 2.9 cm. CT ABDOMEN PELVIS FINDINGS Hepatobiliary: No suspicious liver abnormality. Gallstones are noted measuring up to 1.5 cm. No signs of gallbladder wall inflammation. No bile duct dilatation. Pancreas: Unremarkable. No pancreatic ductal dilatation or surrounding inflammatory changes. Spleen: Normal in size without focal abnormality. Adrenals/Urinary Tract: Normal appearance of the adrenal glands. Several too small to reliably characterize left kidney hypodensities noted. No hydronephrosis. No enhancing mass. Urinary bladder is unremarkable. Stomach/Bowel:  Stomach appears normal. The appendix is visualized and is within normal limits. No abnormal bowel wall thickening, inflammation or distension. Vascular/Lymphatic: Aortic atherosclerosis. No aneurysm. No abdominopelvic adenopathy. Reproductive: Prostate is unremarkable. Other: No free fluid or fluid collections. Musculoskeletal: The index lytic lesion within the left iliac bone measures 1.4 x 1.0 cm, image 106/2. Previously 1.4 x 1.1 cm. No new or progressive bone lesions identified. IMPRESSION: 1. Interval response to therapy. Interval decrease in size of right upper lobe lung mass, as well as right hilar and mediastinal adenopathy. 2. Decrease in volume of right pleural effusion. 3. Decrease in size of lytic metastasis involving the sternal manubrium. Stable left iliac bone lesion. 4. Aortic atherosclerosis and coronary artery atherosclerotic calcifications. 5. Gallstones. Aortic Atherosclerosis (ICD10-I70.0). Electronically Signed   By: Kerby Moors M.D.   On: 06/22/2020 14:22     Assessment and plan- Patient is a 75 y.o. 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  He is here for on treatment assessment prior to cycle 4 of carbo etoposide Tecentriq chemotherapy  Counts okay to proceed with cycle 4 of carbo etoposide Tecentriq chemotherapy today with on pro Neulasta support.  I have reviewed CT chest abdomen and pelvis images independently and discussed findings with the patient.  Overall he has had good response to treatment as evidenced by decrease in the size of the primary lung mass as well as locoregional adenopathy.Decreased size of right pleural effusion.  Right upper lobe mass which was previously 3.9 x 2.4 cm is now 1.3 x 1 cm.  No signs of progressive disease.  Plan is to complete 4 cycles of chemoimmunotherapy followed by maintenance immunotherapy until progression or toxicity.  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  We will start him on levothyroxine 75 mcg Visit Diagnosis 1. Encounter for antineoplastic chemotherapy   2. Encounter for antineoplastic immunotherapy   3. Small cell lung cancer, right ([place])   4. Autoimmune hypothyroidism      Dr. Randa Evens, MD, MPH [facility] at [facility] [phone] 06/26/2020 9:00 AM

## 2020-06-27 ENCOUNTER — Inpatient Hospital Stay: Payer: Managed Care, Other (non HMO)

## 2020-06-27 VITALS — BP 139/57 | HR 60 | Temp 96.9°F | Resp 20

## 2020-06-27 DIAGNOSIS — Z5112 Encounter for antineoplastic immunotherapy: Secondary | ICD-10-CM | POA: Diagnosis not present

## 2020-06-27 DIAGNOSIS — C3491 Malignant neoplasm of unspecified part of right bronchus or lung: Secondary | ICD-10-CM

## 2020-06-27 MED ORDER — SODIUM CHLORIDE 0.9 % IV SOLN
Freq: Once | INTRAVENOUS | Status: AC
  Filled 2020-06-27: qty 250

## 2020-06-27 MED ORDER — SODIUM CHLORIDE 0.9 % IV SOLN
10.0000 mg | Freq: Once | INTRAVENOUS | Status: AC
  Administered 2020-06-27: 10 mg via INTRAVENOUS
  Filled 2020-06-27: qty 10

## 2020-06-27 MED ORDER — SODIUM CHLORIDE 0.9% FLUSH
10.0000 mL | INTRAVENOUS | Status: DC | PRN
  Administered 2020-06-27: 10 mL
  Filled 2020-06-27: qty 10

## 2020-06-27 MED ORDER — HEPARIN SOD (PORK) LOCK FLUSH 100 UNIT/ML IV SOLN
INTRAVENOUS | Status: AC
  Filled 2020-06-27: qty 5

## 2020-06-27 MED ORDER — SODIUM CHLORIDE 0.9 % IV SOLN
100.0000 mg/m2 | Freq: Once | INTRAVENOUS | Status: AC
  Administered 2020-06-27: 280 mg via INTRAVENOUS
  Filled 2020-06-27: qty 14

## 2020-06-27 MED ORDER — HEPARIN SOD (PORK) LOCK FLUSH 100 UNIT/ML IV SOLN
500.0000 [IU] | Freq: Once | INTRAVENOUS | Status: AC | PRN
  Administered 2020-06-27: 500 [IU]
  Filled 2020-06-27: qty 5

## 2020-06-27 NOTE — Patient Instructions (Signed)
[place] [facility] ONCOLOGY  Discharge Instructions: Thank you for choosing [facility] to provide your oncology and hematology care.  If you have a lab appointment with the [facility], please go directly to the [facility] and check in at the registration area.  Wear comfortable clothing and clothing appropriate for easy access to any Portacath or PICC line.   We strive to give you quality time with your provider. You may need to reschedule your appointment if you arrive late (15 or more minutes).  Arriving late affects you and other patients whose appointments are after yours.  Also, if you miss three or more appointments without notifying the office, you may be dismissed from the clinic at the provider's discretion.      For prescription refill requests, have your pharmacy contact our office and allow 72 hours for refills to be completed.    Today you received the following chemotherapy and/or immunotherapy agents - etoposide      To help prevent nausea and vomiting after your treatment, we encourage you to take your nausea medication as directed.  BELOW ARE SYMPTOMS THAT SHOULD BE REPORTED IMMEDIATELY: . *FEVER GREATER THAN 100.4 F (38 C) OR HIGHER . *CHILLS OR SWEATING . *NAUSEA AND VOMITING THAT IS NOT CONTROLLED WITH YOUR NAUSEA MEDICATION . *UNUSUAL SHORTNESS OF BREATH . *UNUSUAL BRUISING OR BLEEDING . *URINARY PROBLEMS (pain or burning when urinating, or frequent urination) . *BOWEL PROBLEMS (unusual diarrhea, constipation, pain near the anus) . TENDERNESS IN MOUTH AND THROAT WITH OR WITHOUT PRESENCE OF ULCERS (sore throat, sores in mouth, or a toothache) . UNUSUAL RASH, SWELLING OR PAIN  . UNUSUAL VAGINAL DISCHARGE OR ITCHING   Items with * indicate a potential emergency and should be followed up as soon as possible or go to the Emergency Department if any problems should occur.  Please show the CHEMOTHERAPY ALERT CARD or IMMUNOTHERAPY  ALERT CARD at check-in to the Emergency Department and triage nurse.  Should you have questions after your visit or need to cancel or reschedule your appointment, please contact [facility]  [phone] and follow the prompts.  Office hours are 8:00 a.m. to 4:30 p.m. Monday - Friday. Please note that voicemails left after 4:00 p.m. may not be returned until the following business day.  We are closed weekends and major holidays. You have access to a nurse at all times for urgent questions. Please call the main number to the clinic [phone] and follow the prompts.  For any non-urgent questions, you may also contact your provider using MyChart. We now offer e-Visits for anyone 80 and older to request care online for non-urgent symptoms. For details visit [email or web address].   Also download the MyChart app! Go to the app store, search "MyChart", open the app, select [facility], and log in with your MyChart username and password.  Due to Covid, a mask is required upon entering the hospital/clinic. If you do not have a mask, one will be given to you upon arrival. For doctor visits, patients may have 1 support person aged 18 or older with them. For treatment visits, patients cannot have anyone with them due to current Covid guidelines and our immunocompromised population.  Etoposide, VP-16 injection What is this medicine? ETOPOSIDE, VP-16 (e toe POE side) is a chemotherapy drug. It is used to treat testicular cancer, lung cancer, and other cancers. This medicine may be used for other purposes; ask your health care provider or pharmacist if  you have questions. COMMON BRAND NAME(S): Etopophos, Toposar, VePesid What should I tell my health care provider before I take this medicine? They need to know if you have any of these conditions:  infection  kidney disease  liver disease  low blood counts, like low white cell, platelet, or red cell counts  an  unusual or allergic reaction to etoposide, other medicines, foods, dyes, or preservatives  pregnant or trying to get pregnant  breast-feeding How should I use this medicine? This medicine is for infusion into a vein. It is administered in a hospital or clinic by a specially trained health care professional. Talk to your pediatrician regarding the use of this medicine in children. Special care may be needed. Overdosage: If you think you have taken too much of this medicine contact a poison control center or emergency room at once. NOTE: This medicine is only for you. Do not share this medicine with others. What if I miss a dose? It is important not to miss your dose. Call your doctor or health care professional if you are unable to keep an appointment. What may interact with this medicine? This medicine may interact with the following medications:  warfarin This list may not describe all possible interactions. Give your health care provider a list of all the medicines, herbs, non-prescription drugs, or dietary supplements you use. Also tell them if you smoke, drink alcohol, or use illegal drugs. Some items may interact with your medicine. What should I watch for while using this medicine? Visit your doctor for checks on your progress. This drug may make you feel generally unwell. This is not uncommon, as chemotherapy can affect healthy cells as well as cancer cells. Report any side effects. Continue your course of treatment even though you feel ill unless your doctor tells you to stop. In some cases, you may be given additional medicines to help with side effects. Follow all directions for their use. Call your doctor or health care professional for advice if you get a fever, chills or sore throat, or other symptoms of a cold or flu. Do not treat yourself. This drug decreases your body's ability to fight infections. Try to avoid being around people who are sick. This medicine may increase your  risk to bruise or bleed. Call your doctor or health care professional if you notice any unusual bleeding. Talk to your doctor about your risk of cancer. You may be more at risk for certain types of cancers if you take this medicine. Do not become pregnant while taking this medicine or for at least 6 months after stopping it. Women should inform their doctor if they wish to become pregnant or think they might be pregnant. Women of child-bearing potential will need to have a negative pregnancy test before starting this medicine. There is a potential for serious side effects to an unborn child. Talk to your health care professional or pharmacist for more information. Do not breast-feed an infant while taking this medicine. Men must use a latex condom during sexual contact with a woman while taking this medicine and for at least 4 months after stopping it. A latex condom is needed even if you have had a vasectomy. Contact your doctor right away if your partner becomes pregnant. Do not donate sperm while taking this medicine and for at least 4 months after you stop taking this medicine. Men should inform their doctors if they wish to father a child. This medicine may lower sperm counts. What side effects may  I notice from receiving this medicine? Side effects that you should report to your doctor or health care professional as soon as possible:  allergic reactions like skin rash, itching or hives, swelling of the face, lips, or tongue  low blood counts - this medicine may decrease the number of white blood cells, red blood cells, and platelets. You may be at increased risk for infections and bleeding  nausea, vomiting  redness, blistering, peeling or loosening of the skin, including inside the mouth  signs and symptoms of infection like fever; chills; cough; sore throat; pain or trouble passing urine  signs and symptoms of low red blood cells or anemia such as unusually weak or tired; feeling faint or  lightheaded; falls; breathing problems  unusual bruising or bleeding Side effects that usually do not require medical attention (report to your doctor or health care professional if they continue or are bothersome):  changes in taste  diarrhea  hair loss  loss of appetite  mouth sores This list may not describe all possible side effects. Call your doctor for medical advice about side effects. You may report side effects to FDA at 1-800-FDA-1088. Where should I keep my medicine? This drug is given in a hospital or clinic and will not be stored at home. NOTE: This sheet is a summary. It may not cover all possible information. If you have questions about this medicine, talk to your doctor, pharmacist, or health care provider.  2021 Elsevier/Gold Standard (2018-04-08 16:57:15)

## 2020-06-28 ENCOUNTER — Inpatient Hospital Stay: Payer: Managed Care, Other (non HMO)

## 2020-06-28 VITALS — BP 125/63 | HR 74 | Temp 96.3°F | Resp 20

## 2020-06-28 DIAGNOSIS — C3491 Malignant neoplasm of unspecified part of right bronchus or lung: Secondary | ICD-10-CM

## 2020-06-28 DIAGNOSIS — Z5112 Encounter for antineoplastic immunotherapy: Secondary | ICD-10-CM | POA: Diagnosis not present

## 2020-06-28 MED ORDER — HEPARIN SOD (PORK) LOCK FLUSH 100 UNIT/ML IV SOLN
INTRAVENOUS | Status: AC
  Filled 2020-06-28: qty 5

## 2020-06-28 MED ORDER — SODIUM CHLORIDE 0.9% FLUSH
10.0000 mL | INTRAVENOUS | Status: DC | PRN
  Filled 2020-06-28: qty 10

## 2020-06-28 MED ORDER — SODIUM CHLORIDE 0.9 % IV SOLN
100.0000 mg/m2 | Freq: Once | INTRAVENOUS | Status: AC
  Administered 2020-06-28: 280 mg via INTRAVENOUS
  Filled 2020-06-28: qty 14

## 2020-06-28 MED ORDER — HEPARIN SOD (PORK) LOCK FLUSH 100 UNIT/ML IV SOLN
500.0000 [IU] | Freq: Once | INTRAVENOUS | Status: AC | PRN
  Administered 2020-06-28: 500 [IU]
  Filled 2020-06-28: qty 5

## 2020-06-28 MED ORDER — SODIUM CHLORIDE 0.9 % IV SOLN
10.0000 mg | Freq: Once | INTRAVENOUS | Status: AC
  Administered 2020-06-28: 10 mg via INTRAVENOUS
  Filled 2020-06-28: qty 10

## 2020-06-28 MED ORDER — SODIUM CHLORIDE 0.9 % IV SOLN
Freq: Once | INTRAVENOUS | Status: AC
  Filled 2020-06-28: qty 250

## 2020-06-28 NOTE — Progress Notes (Signed)
Pt tolerated all infusions well today and left the chemo suite stable in a wheelchair.

## 2020-06-28 NOTE — Patient Instructions (Signed)
[facility]  Discharge Instructions: Thank you for choosing [facility] to provide your oncology and hematology care.  If you have a lab appointment with the [facility], please go directly to the [facility] and check in at the registration area.  Wear comfortable clothing and clothing appropriate for easy access to any Portacath or PICC line.   We strive to give you quality time with your provider. You may need to reschedule your appointment if you arrive late (15 or more minutes).  Arriving late affects you and other patients whose appointments are after yours.  Also, if you miss three or more appointments without notifying the office, you may be dismissed from the clinic at the provider's discretion.      For prescription refill requests, have your pharmacy contact our office and allow 72 hours for refills to be completed.    Today you received the following chemotherapy and/or immunotherapy agents etoposide      To help prevent nausea and vomiting after your treatment, we encourage you to take your nausea medication as directed.  BELOW ARE SYMPTOMS THAT SHOULD BE REPORTED IMMEDIATELY: . *FEVER GREATER THAN 100.4 F (38 C) OR HIGHER . *CHILLS OR SWEATING . *NAUSEA AND VOMITING THAT IS NOT CONTROLLED WITH YOUR NAUSEA MEDICATION . *UNUSUAL SHORTNESS OF BREATH . *UNUSUAL BRUISING OR BLEEDING . *URINARY PROBLEMS (pain or burning when urinating, or frequent urination) . *BOWEL PROBLEMS (unusual diarrhea, constipation, pain near the anus) . TENDERNESS IN MOUTH AND THROAT WITH OR WITHOUT PRESENCE OF ULCERS (sore throat, sores in mouth, or a toothache) . UNUSUAL RASH, SWELLING OR PAIN  . UNUSUAL VAGINAL DISCHARGE OR ITCHING   Items with * indicate a potential emergency and should be followed up as soon as possible or go to the Emergency Department if any problems should occur.  Please show the CHEMOTHERAPY ALERT CARD or IMMUNOTHERAPY  ALERT CARD at check-in to the Emergency Department and triage nurse.  Should you have questions after your visit or need to cancel or reschedule your appointment, please contact [facility]  [phone] and follow the prompts.  Office hours are 8:00 a.m. to 4:30 p.m. Monday - Friday. Please note that voicemails left after 4:00 p.m. may not be returned until the following business day.  We are closed weekends and major holidays. You have access to a nurse at all times for urgent questions. Please call the main number to the clinic [phone] and follow the prompts.  For any non-urgent questions, you may also contact your provider using MyChart. We now offer e-Visits for anyone 47 and older to request care online for non-urgent symptoms. For details visit [email or web address].   Also download the MyChart app! Go to the app store, search "MyChart", open the app, select [facility], and log in with your MyChart username and password.  Due to Covid, a mask is required upon entering the hospital/clinic. If you do not have a mask, one will be given to you upon arrival. For doctor visits, patients may have 1 support person aged 52 or older with them. For treatment visits, patients cannot have anyone with them due to current Covid guidelines and our immunocompromised population.   Etoposide, VP-16 injection What is this medicine? ETOPOSIDE, VP-16 (e toe POE side) is a chemotherapy drug. It is used to treat testicular cancer, lung cancer, and other cancers. This medicine may be used for other purposes; ask your health care provider or pharmacist if  you have questions. COMMON BRAND NAME(S): Etopophos, Toposar, VePesid What should I tell my health care provider before I take this medicine? They need to know if you have any of these conditions:  infection  kidney disease  liver disease  low blood counts, like low white cell, platelet, or red cell counts  an  unusual or allergic reaction to etoposide, other medicines, foods, dyes, or preservatives  pregnant or trying to get pregnant  breast-feeding How should I use this medicine? This medicine is for infusion into a vein. It is administered in a hospital or clinic by a specially trained health care professional. Talk to your pediatrician regarding the use of this medicine in children. Special care may be needed. Overdosage: If you think you have taken too much of this medicine contact a poison control center or emergency room at once. NOTE: This medicine is only for you. Do not share this medicine with others. What if I miss a dose? It is important not to miss your dose. Call your doctor or health care professional if you are unable to keep an appointment. What may interact with this medicine? This medicine may interact with the following medications:  warfarin This list may not describe all possible interactions. Give your health care provider a list of all the medicines, herbs, non-prescription drugs, or dietary supplements you use. Also tell them if you smoke, drink alcohol, or use illegal drugs. Some items may interact with your medicine. What should I watch for while using this medicine? Visit your doctor for checks on your progress. This drug may make you feel generally unwell. This is not uncommon, as chemotherapy can affect healthy cells as well as cancer cells. Report any side effects. Continue your course of treatment even though you feel ill unless your doctor tells you to stop. In some cases, you may be given additional medicines to help with side effects. Follow all directions for their use. Call your doctor or health care professional for advice if you get a fever, chills or sore throat, or other symptoms of a cold or flu. Do not treat yourself. This drug decreases your body's ability to fight infections. Try to avoid being around people who are sick. This medicine may increase your  risk to bruise or bleed. Call your doctor or health care professional if you notice any unusual bleeding. Talk to your doctor about your risk of cancer. You may be more at risk for certain types of cancers if you take this medicine. Do not become pregnant while taking this medicine or for at least 6 months after stopping it. Women should inform their doctor if they wish to become pregnant or think they might be pregnant. Women of child-bearing potential will need to have a negative pregnancy test before starting this medicine. There is a potential for serious side effects to an unborn child. Talk to your health care professional or pharmacist for more information. Do not breast-feed an infant while taking this medicine. Men must use a latex condom during sexual contact with a woman while taking this medicine and for at least 4 months after stopping it. A latex condom is needed even if you have had a vasectomy. Contact your doctor right away if your partner becomes pregnant. Do not donate sperm while taking this medicine and for at least 4 months after you stop taking this medicine. Men should inform their doctors if they wish to father a child. This medicine may lower sperm counts. What side effects may  I notice from receiving this medicine? Side effects that you should report to your doctor or health care professional as soon as possible:  allergic reactions like skin rash, itching or hives, swelling of the face, lips, or tongue  low blood counts - this medicine may decrease the number of white blood cells, red blood cells, and platelets. You may be at increased risk for infections and bleeding  nausea, vomiting  redness, blistering, peeling or loosening of the skin, including inside the mouth  signs and symptoms of infection like fever; chills; cough; sore throat; pain or trouble passing urine  signs and symptoms of low red blood cells or anemia such as unusually weak or tired; feeling faint or  lightheaded; falls; breathing problems  unusual bruising or bleeding Side effects that usually do not require medical attention (report to your doctor or health care professional if they continue or are bothersome):  changes in taste  diarrhea  hair loss  loss of appetite  mouth sores This list may not describe all possible side effects. Call your doctor for medical advice about side effects. You may report side effects to FDA at 1-800-FDA-1088. Where should I keep my medicine? This drug is given in a hospital or clinic and will not be stored at home. NOTE: This sheet is a summary. It may not cover all possible information. If you have questions about this medicine, talk to your doctor, pharmacist, or health care provider.  2021 Elsevier/Gold Standard (2018-04-08 16:57:15)

## 2020-07-17 ENCOUNTER — Inpatient Hospital Stay: Payer: Managed Care, Other (non HMO)

## 2020-07-17 ENCOUNTER — Encounter: Payer: Self-pay | Admitting: Oncology

## 2020-07-17 ENCOUNTER — Inpatient Hospital Stay (HOSPITAL_BASED_OUTPATIENT_CLINIC_OR_DEPARTMENT_OTHER): Payer: Managed Care, Other (non HMO) | Admitting: Hospice and Palliative Medicine

## 2020-07-17 ENCOUNTER — Inpatient Hospital Stay (HOSPITAL_BASED_OUTPATIENT_CLINIC_OR_DEPARTMENT_OTHER): Payer: Managed Care, Other (non HMO) | Admitting: Oncology

## 2020-07-17 VITALS — BP 147/65 | HR 70 | Temp 97.9°F | Resp 18 | Wt 332.0 lb

## 2020-07-17 DIAGNOSIS — Z5112 Encounter for antineoplastic immunotherapy: Secondary | ICD-10-CM

## 2020-07-17 DIAGNOSIS — C3491 Malignant neoplasm of unspecified part of right bronchus or lung: Secondary | ICD-10-CM

## 2020-07-17 DIAGNOSIS — D751 Secondary polycythemia: Secondary | ICD-10-CM

## 2020-07-17 DIAGNOSIS — E063 Autoimmune thyroiditis: Secondary | ICD-10-CM | POA: Diagnosis not present

## 2020-07-17 DIAGNOSIS — Z515 Encounter for palliative care: Secondary | ICD-10-CM

## 2020-07-17 LAB — COMPREHENSIVE METABOLIC PANEL
ALT: 22 U/L (ref 0–44)
AST: 24 U/L (ref 15–41)
Albumin: 3.4 g/dL — ABNORMAL LOW (ref 3.5–5.0)
Alkaline Phosphatase: 82 U/L (ref 38–126)
Anion gap: 9 (ref 5–15)
BUN: 10 mg/dL (ref 8–23)
CO2: 24 mmol/L (ref 22–32)
Calcium: 8.6 mg/dL — ABNORMAL LOW (ref 8.9–10.3)
Chloride: 104 mmol/L (ref 98–111)
Creatinine, Ser: 0.85 mg/dL (ref 0.61–1.24)
GFR, Estimated: >60 mL/min (ref >60)
Glucose, Bld: 163 mg/dL — ABNORMAL HIGH (ref 70–99)
Potassium: 3.9 mmol/L (ref 3.5–5.1)
Sodium: 137 mmol/L (ref 135–145)
Total Bilirubin: 0.5 mg/dL (ref 0.3–1.2)
Total Protein: 6.7 g/dL (ref 6.5–8.1)

## 2020-07-17 LAB — CBC WITH DIFFERENTIAL/PLATELET
Abs Immature Granulocytes: 0.5 10*3/uL — ABNORMAL HIGH (ref 0.00–0.07)
Basophils Absolute: 0.1 10*3/uL (ref 0.0–0.1)
Basophils Relative: 1 %
Eosinophils Absolute: 0.1 10*3/uL (ref 0.0–0.5)
Eosinophils Relative: 1 %
HCT: 42.4 % (ref 39.0–52.0)
Hemoglobin: 14 g/dL (ref 13.0–17.0)
Immature Granulocytes: 5 %
Lymphocytes Relative: 18 %
Lymphs Abs: 1.8 10*3/uL (ref 0.7–4.0)
MCH: 32.5 pg (ref 26.0–34.0)
MCHC: 33 g/dL (ref 30.0–36.0)
MCV: 98.4 fL (ref 80.0–100.0)
Monocytes Absolute: 0.7 10*3/uL (ref 0.1–1.0)
Monocytes Relative: 6 %
Neutro Abs: 7.1 10*3/uL (ref 1.7–7.7)
Neutrophils Relative %: 69 %
Platelets: 257 10*3/uL (ref 150–400)
RBC: 4.31 MIL/uL (ref 4.22–5.81)
RDW: 17.7 % — ABNORMAL HIGH (ref 11.5–15.5)
WBC: 10.3 10*3/uL (ref 4.0–10.5)
nRBC: 0 % (ref 0.0–0.2)

## 2020-07-17 MED ORDER — SODIUM CHLORIDE 0.9 % IV SOLN
Freq: Once | INTRAVENOUS | Status: AC
  Filled 2020-07-17: qty 250

## 2020-07-17 MED ORDER — HEPARIN SOD (PORK) LOCK FLUSH 100 UNIT/ML IV SOLN
500.0000 [IU] | Freq: Once | INTRAVENOUS | Status: AC
  Filled 2020-07-17: qty 5

## 2020-07-17 MED ORDER — SODIUM CHLORIDE 0.9 % IV SOLN
1200.0000 mg | Freq: Once | INTRAVENOUS | Status: AC
  Administered 2020-07-17: 1200 mg via INTRAVENOUS
  Filled 2020-07-17: qty 20

## 2020-07-17 MED ORDER — HEPARIN SOD (PORK) LOCK FLUSH 100 UNIT/ML IV SOLN
INTRAVENOUS | Status: AC
  Filled 2020-07-17: qty 5

## 2020-07-17 MED ORDER — SODIUM CHLORIDE 0.9% FLUSH
10.0000 mL | Freq: Once | INTRAVENOUS | Status: AC
  Administered 2020-07-17: 10 mL via INTRAVENOUS
  Filled 2020-07-17: qty 10

## 2020-07-17 MED ORDER — HEPARIN SOD (PORK) LOCK FLUSH 100 UNIT/ML IV SOLN
500.0000 [IU] | Freq: Once | INTRAVENOUS | Status: AC | PRN
  Administered 2020-07-17: 500 [IU]
  Filled 2020-07-17: qty 5

## 2020-07-17 MED ORDER — ZOLEDRONIC ACID 4 MG/100ML IV SOLN
4.0000 mg | Freq: Once | INTRAVENOUS | Status: AC
  Administered 2020-07-17: 4 mg via INTRAVENOUS
  Filled 2020-07-17: qty 100

## 2020-07-17 NOTE — Progress Notes (Signed)
[facility]  Telephone:([phone] [phone]   Name: David Johnson Date: 07/17/2020 MRN: [id number]  DOB: 23-Jul-1945  Patient Care Team: [place] Pink, MD as PCP - General (Family Medicine)    REASON FOR CONSULTATION: David Johnson is a 75 y.o. 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  PMH is also notable for COPD and OSA requiring CPAP.  Palliative care was consulted help address goals manage ongoing symptoms.  SOCIAL HISTORY:     reports that he has been smoking cigarettes and cigars. He has a 52.50 pack-year smoking history. He has never used smokeless tobacco. He reports previous alcohol use. He reports that he does not use drugs.  Patient is married and lives at home with his wife and daughter.  He has another daughter who lives nearby.  Patient works as a Medical laboratory scientific officer for [employer]:  None on file  CODE STATUS:   PAST MEDICAL HISTORY: Past Medical History:  Diagnosis Date  . Adenomatous polyps   . Asthma   . Barrett'David esophagus   . Colon polyp   . COPD (chronic obstructive pulmonary disease) ([place])    uses inhaler, "i may have COPD"  . Dyspnea   . Eye injury   . GERD (gastroesophageal reflux disease)   . History of gonorrhea   . History of pneumonia   . Lung cancer ([place])   . Obesity   . Pneumonia   . Reflux esophagitis   . Sleep apnea     PAST SURGICAL HISTORY:  Past Surgical History:  Procedure Laterality Date  . CATARACT EXTRACTION Left   . COLONOSCOPY    . COLONOSCOPY WITH PROPOFOL N/A 05/19/2015   Procedure: COLONOSCOPY WITH PROPOFOL;  Surgeon: [name], MD;  [facility] END[facility]COPY;  Service: Endoscopy;  Laterality: N/A;  . COLONOSCOPY WITH PROPOFOL N/A 09/11/2018   Procedure: COLONOSCOPY WITH PROPOFOL;  Surgeon: [name], MD;  Location: [facility]  ENDOSCOPY;  Service: Endoscopy;  Laterality: N/A;  . ESOPHAGOGASTRODUODENOSCOPY (EGD) WITH PROPOFOL N/A 05/19/2015   Procedure: ESOPHAGOGASTRODUODENOSCOPY (EGD) WITH PROPOFOL;  Surgeon: [name], MD;  Location: [facility] ENDOSCOPY;  Service: Endoscopy;  Laterality: N/A;  . ESOPHAGOGASTRODUODENOSCOPY (EGD) WITH PROPOFOL N/A 10/11/2016   Procedure: ESOPHAGOGASTRODUODENOSCOPY (EGD) WITH PROPOFOL;  Surgeon: [name], MD;  Location: [facility] ENDOSCOPY;  Service: Endoscopy;  Laterality: N/A;  . ESOPHAGOGASTRODUODENOSCOPY (EGD) WITH PROPOFOL N/A 01/01/2018   Procedure: ESOPHAGOGASTRODUODENOSCOPY (EGD) WITH PROPOFOL;  Surgeon: [name], MD;  Location[facility] ENDOSCOPY;  Service: Endoscopy;  Laterality: N/A;  . ESOPHAGOGASTRODUODENOSCOPY (EGD) WITH PROPOFOL N/A 09/11/2018   Procedure: ESOPHAGOGASTRODUODENOSCOPY (EGD) WITH PROPOFOL;  Surgeon: [name], MD;  Location: [facility] ENDOSCOPY;  Service: Endoscopy;  Laterality: N/A;  . EYE SURGERY    . IR IMAGING GUIDED PORT INSERTION  04/21/2020    HEMATOLOGY/ONCOLOGY HISTORY:  Oncology History  Small cell lung cancer, right ([place])  04/18/2020 Initial Diagnosis   Small cell lung cancer, right ([place])   04/18/2020 Cancer Staging   Staging form: Lung, AJCC 8th Edition - Clinical: Stage IVB (cT3, cN2, pM1c) - Signed by Sindy Guadeloupe, MD on 04/18/2020   04/24/2020 -  Chemotherapy    Patient is on Treatment Plan: LUNG SCLC CARBOPLATIN + ETOPOSIDE + ATEZOLIZUMAB INDUCTION Q21D / ATEZOLIZUMAB MAINTENANCE Q21D        ALLERGIES:  is allergic to cephalexin.  MEDICATIONS:  Current Outpatient  Medications  Medication Sig Dispense Refill  . albuterol (PROVENTIL HFA;VENTOLIN HFA) 108 (90 Base) MCG/ACT inhaler 2 puffs q.i.d. p.r.n. short of breath, wheezing, or cough    . levothyroxine (SYNTHROID) 75 MCG tablet Take 1 tablet (75 mcg total) by mouth daily before breakfast. 30 tablet 2  . lidocaine-prilocaine (EMLA) cream Apply to affected area once  30 g 3  . LORazepam (ATIVAN) 0.5 MG tablet Take 1 tablet (0.5 mg total) by mouth 3 (three) times daily as needed (Nausea or vomiting). (Patient not taking: No sig reported) 30 tablet 0  . Morphine Sulfate, Concentrate, 5 MG/0.25ML SOLN Take 5 mg by mouth every 4 (four) hours as needed. For dyspnea (Patient not taking: No sig reported) 30 mL 0  . ondansetron (ZOFRAN) 8 MG tablet Take 1 tablet (8 mg total) by mouth 2 (two) times daily as needed for refractory nausea / vomiting. Start on day 3 after carboplatin chemo. (Patient not taking: No sig reported) 30 tablet 1  . prochlorperazine (COMPAZINE) 10 MG tablet Take 1 tablet (10 mg total) by mouth every 6 (six) hours as needed (Nausea or vomiting). (Patient not taking: No sig reported) 30 tablet 1   No current facility-administered medications for this visit.   Facility-Administered Medications Ordered in Other Visits  Medication Dose Route Frequency Provider Last Rate Last Admin  . atezolizumab (TECENTRIQ) 1,200 mg in sodium chloride 0.9 % 250 mL chemo infusion  1,200 mg Intravenous Once Sindy Guadeloupe, MD 540 mL/hr at 07/17/20 1014 1,200 mg at 07/17/20 1014  . heparin lock flush 100 unit/mL  500 Units Intravenous Once Sindy Guadeloupe, MD      . heparin lock flush 100 unit/mL  500 Units Intracatheter Once PRN Sindy Guadeloupe, MD        VITAL SIGNS: There were no vitals taken for this visit. There were no vitals filed for this visit.  Estimated body mass index is 43.8 kg/m as calculated from the following:   Height as of 06/26/20: 6\' 1"  (1.854 m).   Weight as of an earlier encounter on 07/17/20: 332 lb (150.6 kg).  LABS: CBC:    Component Value Date/Time   WBC 10.3 07/17/2020 0829   HGB 14.0 07/17/2020 0829   HGB 17.3 05/13/2013 1335   HCT 42.4 07/17/2020 0829   HCT 51.8 05/13/2013 1335   PLT 257 07/17/2020 0829   PLT 170 05/13/2013 1335   MCV 98.4 07/17/2020 0829   MCV 93 05/13/2013 1335   NEUTROABS 7.1 07/17/2020 0829   NEUTROABS 7.0  (H) 05/13/2013 1335   LYMPHSABS 1.8 07/17/2020 0829   LYMPHSABS 3.2 05/13/2013 1335   MONOABS 0.7 07/17/2020 0829   MONOABS 1.0 05/13/2013 1335   EOSABS 0.1 07/17/2020 0829   EOSABS 0.2 05/13/2013 1335   BASOSABS 0.1 07/17/2020 0829   BASOSABS 0.1 05/13/2013 1335   Comprehensive Metabolic Panel:    Component Value Date/Time   NA 137 07/17/2020 0829   K 3.9 07/17/2020 0829   CL 104 07/17/2020 0829   CO2 24 07/17/2020 0829   BUN 10 07/17/2020 0829   CREATININE 0.85 07/17/2020 0829   GLUCOSE 163 (H) 07/17/2020 0829   CALCIUM 8.6 (L) 07/17/2020 0829   AST 24 07/17/2020 0829   ALT 22 07/17/2020 0829   ALKPHOS 82 07/17/2020 0829   BILITOT 0.5 07/17/2020 0829   PROT 6.7 07/17/2020 0829   ALBUMIN 3.4 (L) 07/17/2020 0829    RADIOGRAPHIC STUDIES: CT CHEST ABDOMEN PELVIS W CONTRAST  Result Date: 06/22/2020  CLINICAL DATA:  Restaging lung cancer. EXAM: CT CHEST, ABDOMEN, AND PELVIS WITH CONTRAST TECHNIQUE: Multidetector CT imaging of the chest, abdomen and pelvis was performed following the standard protocol during bolus administration of intravenous contrast. CONTRAST:  155mL OMNIPAQUE IOHEXOL 300 MG/ML  SOLN COMPARISON:  03/30/2020. FINDINGS: CT CHEST FINDINGS Cardiovascular: Heart size appears within normal limits. No pericardial effusion. Mild aortic atherosclerosis. Coronary artery atherosclerotic calcifications. Mediastinum/Nodes: Diffusely enlarged thyroid gland is identified. Similar appearance of 2.7 cm right lobe of thyroid gland nodule, image 7/2. In the setting of significant comorbidities or limited life expectancy, no follow-up recommended (ref: J Am Coll Radiol. 2015 Feb;12(2): 143-50). No new discrete nodule noted. The trachea appears patent and is midline. Normal appearance of the esophagus. No enlarged axillary or supraclavicular lymph nodes. Right paratracheal lymph node measures 0.9 cm, image 23/5. Previously 2.1 cm. Lower right paratracheal lymph node has a short axis of 1.5  cm, image 28/2. Previously 3.8 cm. Previous index right hilar node has resolved in the interval. Previous index right pre-vascular lymph node has also resolved in the interval. Lungs/Pleura: Decreased volume of right pleural effusion. The mass within the posterior right upper lobe measures 1.3 by 1.0 cm, image 25/2. Previously 3.9 x 2.4 cm. Index right middle lobe lung nodule measures 4 mm, image 101/4. Previously 1.2 cm. Musculoskeletal: There is increased sclerosis surrounding the FDG avid lytic lesion involving the sternal manubrium. This measures 2.6 cm, image 16/2. Previously 2.9 cm. CT ABDOMEN PELVIS FINDINGS Hepatobiliary: No suspicious liver abnormality. Gallstones are noted measuring up to 1.5 cm. No signs of gallbladder wall inflammation. No bile duct dilatation. Pancreas: Unremarkable. No pancreatic ductal dilatation or surrounding inflammatory changes. Spleen: Normal in size without focal abnormality. Adrenals/Urinary Tract: Normal appearance of the adrenal glands. Several too small to reliably characterize left kidney hypodensities noted. No hydronephrosis. No enhancing mass. Urinary bladder is unremarkable. Stomach/Bowel: Stomach appears normal. The appendix is visualized and is within normal limits. No abnormal bowel wall thickening, inflammation or distension. Vascular/Lymphatic: Aortic atherosclerosis. No aneurysm. No abdominopelvic adenopathy. Reproductive: Prostate is unremarkable. Other: No free fluid or fluid collections. Musculoskeletal: The index lytic lesion within the left iliac bone measures 1.4 x 1.0 cm, image 106/2. Previously 1.4 x 1.1 cm. No new or progressive bone lesions identified. IMPRESSION: 1. Interval response to therapy. Interval decrease in size of right upper lobe lung mass, as well as right hilar and mediastinal adenopathy. 2. Decrease in volume of right pleural effusion. 3. Decrease in size of lytic metastasis involving the sternal manubrium. Stable left iliac bone lesion.  4. Aortic atherosclerosis and coronary artery atherosclerotic calcifications. 5. Gallstones. Aortic Atherosclerosis (ICD10-I70.0). Electronically Signed   By: Kerby Moors M.D.   On: 06/22/2020 14:22    PERFORMANCE STATUS (ECOG) : 1 - Symptomatic but completely ambulatory  Review of Systems Unless otherwise noted, a complete review of systems is negative.  Physical Exam General: NAD Pulmonary: Unlabored Extremities: no edema, no joint deformities Skin: no rashes Neurological: Weakness but otherwise nonfocal  IMPRESSION: Follow-up visit.  Patient seen in infusion.  CT on 06/22/2020 revealed interval response to treatment with decreased size of lung mass and hilar mediastinal adenopathy.  Patient reports he is doing well.  He is tolerated chemo without any adverse effects.  He denies any symptomatic complaints at present.  Performance status is stable.  Appetite is good.  Weight is stable.  No issues with medications nor need for refills today.  PLAN: -Continue current scope of treatment -ACP/MOST form previously  reviewed -Follow-up MyChart visit 2-3 months   Patient expressed understanding and was in agreement with this plan. He also understands that He can call the clinic at any time with any questions, concerns, or complaints.     Time Total: 15 minutes  Visit consisted of counseling and education dealing with the complex and emotionally intense issues of symptom management and palliative care in the setting of serious and potentially life-threatening illness.Greater than 50%  of this time was spent counseling and coordinating care related to the above assessment and plan.  Signed by: Altha Harm, PhD, NP-C

## 2020-07-17 NOTE — Progress Notes (Signed)
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 Past medical history significant for COPD, GERD and obstructive sleep apnea. CT angio chest abdomen pelvis showed 6.4 x 3.7 cm right paratracheal adenopathy concerning for malignancy. 1.9 cm right hilar node. 12 mm anterior mediastinal lymph node. 2.8 cm right thyroid nodule. Several pleural-based masses concerning for metastatic disease. Large irregular opacity in the right upper lobe which may be malignancy versus postobstructive atelectasis. Lytic lesion noted in the manubrium consistent with metastatic disease.Moderate right-sided pleural effusion s/p thoracentesis.   Cytology showedPoorly differentiated carcinoma with neuroendocrine differentiation. Tumor cells were positive for pancytokeratin and CD56 but negative for TTF-1 p40 and synaptophysin. Chromogranin stain was also negative.  MRI brain was negative for metastatic disease  Interval history-patient reports baseline fatigue.  He does feel that his exertional shortness of breath is somewhat worse as compared to a month ago.  Denies any nausea or vomiting  ECOG PS- 1 Pain scale- 0   Review of systems- Review of Systems  Constitutional: Positive for malaise/fatigue. Negative for chills, fever and weight loss.  HENT: Negative for congestion, ear  discharge and nosebleeds.   Eyes: Negative for blurred vision.  Respiratory: Positive for shortness of breath. Negative for cough, hemoptysis, sputum production and wheezing.   Cardiovascular: Negative for chest pain, palpitations, orthopnea and claudication.  Gastrointestinal: Negative for abdominal pain, blood in stool, constipation, diarrhea, heartburn, melena, nausea and vomiting.  Genitourinary: Negative for dysuria, flank pain, frequency, hematuria and urgency.  Musculoskeletal: Negative for back pain, joint pain and myalgias.  Skin: Negative for rash.  Neurological: Negative for dizziness, tingling, focal weakness, seizures, weakness and headaches.  Endo/Heme/Allergies: Does not bruise/bleed easily.  Psychiatric/Behavioral: Negative for depression and suicidal ideas. The patient does not have insomnia.       Allergies  Allergen Reactions  . Cephalexin Rash     Past Medical History:  Diagnosis Date  . Adenomatous polyps   . Asthma   . Barrett's esophagus   . Colon polyp   . COPD (chronic obstructive pulmonary disease) ([place])    uses inhaler, "i may have COPD"  . Dyspnea   . Eye injury   . GERD (gastroesophageal reflux disease)   . History of gonorrhea   . History of pneumonia   . Lung cancer ([place])   . Obesity   . Pneumonia   . Reflux esophagitis   . Sleep apnea      Past Surgical History:  Procedure Laterality Date  . CATARACT EXTRACTION Left   . COLONOSCOPY    . COLONOSCOPY WITH PROPOFOL N/A 05/19/2015   Procedure: COLONOSCOPY WITH PROPOFOL;  Surgeon: [name], MD;  Location: [facility] ENDOSCOPY;  Service: Endoscopy;  Laterality: N/A;  . COLONOSCOPY WITH PROPOFOL N/A 09/11/2018   Procedure: COLONOSCOPY WITH PROPOFOL;  Surgeon: [name], MD;  Location: [facility] ENDOSCOPY;  Service: Endoscopy;  Laterality: N/A;  . ESOPHAGOGASTRODUODENOSCOPY (EGD) WITH PROPOFOL N/A  05/19/2015   Procedure: ESOPHAGOGASTRODUODENOSCOPY (EGD) WITH PROPOFOL;  Surgeon: [name], MD;  Location: [facility] ENDOSCOPY;  Service: Endoscopy;  Laterality: N/A;  . ESOPHAGOGASTRODUODENOSCOPY (EGD) WITH PROPOFOL N/A 10/11/2016   Procedure: ESOPHAGOGASTRODUODENOSCOPY (EGD) WITH PROPOFOL;  Surgeon: [name], MD;  Location: [facility] ENDOSCOPY;  Service: Endoscopy;  Laterality: N/A;  . ESOPHAGOGASTRODUODENOSCOPY (EGD) WITH PROPOFOL N/A 01/01/2018   Procedure: ESOPHAGOGASTRODUODENOSCOPY (EGD) WITH PROPOFOL;  Surgeon: [name], MD;  Location[facility] ENDOSCOPY;  Service: Endoscopy;  Laterality: N/A;  . ESOPHAGOGASTRODUODENOSCOPY (EGD) WITH PROPOFOL N/A 09/11/2018   Procedure: ESOPHAGOGASTRODUODENOSCOPY (EGD) WITH PROPOFOL;  Surgeon: [name], MD;  Location: [facility] ENDOSCOPY;  Service: Endoscopy;  Laterality: N/A;  . EYE SURGERY    . IR IMAGING GUIDED PORT INSERTION  04/21/2020    Social History   Socioeconomic History  . Marital status: Married    Spouse name: Not on file  . Number of children: Not on file  . Years of education: Not on file  . Highest education level: Not on file  Occupational History  . Not on file  Tobacco Use  . Smoking status: Current Every Day Smoker    Packs/day: 1.50    Years: 35.00    Pack years: 52.50    Types: Cigarettes, Cigars    Last attempt to quit: 03/04/2015    Years since quitting: 5.3  . Smokeless tobacco: Never Used  Vaping Use  . Vaping Use: Former  Substance and Sexual Activity  . Alcohol use: Not Currently    Comment: none or 3 weeks   . Drug use: No  . Sexual activity: Not on file  Other Topics Concern  . Not on file  Social History Narrative  . Not on file   Social Determinants of Health   Financial Resource Strain: Not on file  Food Insecurity: Not on file  Transportation Needs: Not on file  Physical Activity: Not on file  Stress: Not on file  Social Connections: Not on file  Intimate Partner Violence: Not on file    Family History  Problem Relation Age of Onset  . Emphysema Mother       Current Outpatient Medications:  .  albuterol (PROVENTIL HFA;VENTOLIN HFA) 108 (90 Base) MCG/ACT inhaler, 2 puffs q.i.d. p.r.n. short of breath, wheezing, or cough, Disp: , Rfl:  .  levothyroxine (SYNTHROID) 75 MCG tablet, Take 1 tablet (75 mcg total) by mouth daily before breakfast., Disp: 30 tablet, Rfl: 2 .  lidocaine-prilocaine (EMLA) cream, Apply to affected area once, Disp: 30 g, Rfl: 3 .  LORazepam (ATIVAN) 0.5 MG tablet, Take 1 tablet (0.5 mg total) by mouth 3 (three) times daily as needed (Nausea or vomiting). (Patient not taking: No sig reported), Disp: 30 tablet, Rfl: 0 .  Morphine Sulfate, Concentrate, 5 MG/0.25ML SOLN, Take 5 mg by mouth every 4 (four) hours as needed. For dyspnea (Patient not taking: No sig reported), Disp: 30 mL, Rfl: 0 .  ondansetron (ZOFRAN) 8 MG tablet, Take 1 tablet (8 mg total) by mouth 2 (two) times daily as needed for refractory nausea / vomiting. Start on day 3 after carboplatin chemo. (Patient not taking: No sig reported), Disp: 30 tablet, Rfl: 1 .  prochlorperazine (COMPAZINE) 10 MG tablet, Take 1 tablet (10 mg total) by mouth every 6 (six) hours as needed (Nausea or vomiting). (Patient not taking: No sig reported), Disp: 30 tablet, Rfl: 1 No current facility-administered medications for this visit.  Facility-Administered Medications Ordered in Other Visits:  .  heparin lock flush 100 unit/mL, 500 Units, Intravenous, Once, Sindy Guadeloupe, MD  Physical exam: There were no vitals filed for this visit. Physical Exam Constitutional:      General: He is not in acute distress.    Comments: He is sitting in a wheelchair and appears in no acute distress  Cardiovascular:     Rate and Rhythm: Normal rate and regular rhythm.     Heart sounds: Normal heart sounds.  Pulmonary:     Effort: Pulmonary effort is normal.     Breath sounds: Normal breath sounds.  Abdominal:     General: Bowel sounds are normal.     Palpations: Abdomen is soft.  Skin:     General: Skin is warm and dry.  Neurological:     Mental Status: He is alert and oriented to person, place, and time.      CMP Latest Ref Rng & Units 06/26/2020  Glucose 70 - 99 mg/dL 136(H)  BUN 8 - 23 mg/dL 8  Creatinine 0.61 - 1.24 mg/dL 0.66  Sodium 135 - 145 mmol/L 138  Potassium 3.5 - 5.1 mmol/L 3.7  Chloride 98 - 111 mmol/L 107  CO2 22 - 32 mmol/L 22  Calcium 8.9 - 10.3 mg/dL 8.3(L)  Total Protein 6.5 - 8.1 g/dL 6.5  Total Bilirubin 0.3 - 1.2 mg/dL 0.4  Alkaline Phos 38 - 126 U/L 78  AST 15 - 41 U/L 23  ALT 0 - 44 U/L 23   CBC Latest Ref Rng & Units 06/26/2020  WBC 4.0 - 10.5 K/uL 10.8(H)  Hemoglobin 13.0 - 17.0 g/dL 14.3  Hematocrit 39.0 - 52.0 % 42.7  Platelets 150 - 400 K/uL 274    No images are attached to the encounter.  CT CHEST ABDOMEN PELVIS W CONTRAST  Result Date: 06/22/2020 CLINICAL DATA:  Restaging lung cancer. EXAM: CT CHEST, ABDOMEN, AND PELVIS WITH CONTRAST TECHNIQUE: Multidetector CT imaging of the chest, abdomen and pelvis was performed following the standard protocol during bolus administration of intravenous contrast. CONTRAST:  153mL OMNIPAQUE IOHEXOL 300 MG/ML  SOLN COMPARISON:  03/30/2020. FINDINGS: CT CHEST FINDINGS Cardiovascular: Heart size appears within normal limits. No pericardial effusion. Mild aortic atherosclerosis. Coronary artery atherosclerotic calcifications. Mediastinum/Nodes: Diffusely enlarged thyroid gland is identified. Similar appearance of 2.7 cm right lobe of thyroid gland nodule, image 7/2. In the setting of significant comorbidities or limited life expectancy, no follow-up recommended (ref: J Am Coll Radiol. 2015 Feb;12(2): 143-50). No new discrete nodule noted. The trachea appears patent and is midline. Normal appearance of the esophagus. No enlarged axillary or supraclavicular lymph nodes. Right paratracheal lymph node measures 0.9 cm, image 23/5. Previously 2.1 cm. Lower right paratracheal lymph node has a short axis of 1.5 cm, image  28/2. Previously 3.8 cm. Previous index right hilar node has resolved in the interval. Previous index right pre-vascular lymph node has also resolved in the interval. Lungs/Pleura: Decreased volume of right pleural effusion. The mass within the posterior right upper lobe measures 1.3 by 1.0 cm, image 25/2. Previously 3.9 x 2.4 cm. Index right middle lobe lung nodule measures 4 mm, image 101/4. Previously 1.2 cm. Musculoskeletal: There is increased sclerosis surrounding the FDG avid lytic lesion involving the sternal manubrium. This measures 2.6 cm, image 16/2. Previously 2.9 cm. CT ABDOMEN PELVIS FINDINGS Hepatobiliary: No suspicious liver abnormality. Gallstones are noted measuring up to 1.5 cm. No signs of gallbladder wall inflammation. No bile duct dilatation. Pancreas: Unremarkable. No pancreatic ductal dilatation or surrounding inflammatory changes. Spleen:  Normal in size without focal abnormality. Adrenals/Urinary Tract: Normal appearance of the adrenal glands. Several too small to reliably characterize left kidney hypodensities noted. No hydronephrosis. No enhancing mass. Urinary bladder is unremarkable. Stomach/Bowel: Stomach appears normal. The appendix is visualized and is within normal limits. No abnormal bowel wall thickening, inflammation or distension. Vascular/Lymphatic: Aortic atherosclerosis. No aneurysm. No abdominopelvic adenopathy. Reproductive: Prostate is unremarkable. Other: No free fluid or fluid collections. Musculoskeletal: The index lytic lesion within the left iliac bone measures 1.4 x 1.0 cm, image 106/2. Previously 1.4 x 1.1 cm. No new or progressive bone lesions identified. IMPRESSION: 1. Interval response to therapy. Interval decrease in size of right upper lobe lung mass, as well as right hilar and mediastinal adenopathy. 2. Decrease in volume of right pleural effusion. 3. Decrease in size of lytic metastasis involving the sternal manubrium. Stable left iliac bone lesion. 4. Aortic  atherosclerosis and coronary artery atherosclerotic calcifications. 5. Gallstones. Aortic Atherosclerosis (ICD10-I70.0). Electronically Signed   By: Kerby Moors M.D.   On: 06/22/2020 14:22     Assessment and plan- Patient is a 75 y.o. 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   He is here for on treatment assessment prior to cycle 1 of maintenance Tecentriq chemotherapy  Counts okay to proceed with cycle 1 of maintenance Tecentriq chemotherapy today.  I will see him back in 3 weeks with CBC with differential CMP and TSH for cycle 2.  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  Repeat TSH in 3 weeks  Exertional shortness of breath: He has good bilateral air entry.  Likelihood of having a recurrent pleural effusion causing shortness of breath is low.  However in the patient complains of worsening shortness of breath over the next 3 weeks, we can get a chest x-ray and possible thoracentesis if need be    Visit Diagnosis 1. Encounter for antineoplastic immunotherapy   2. Small cell lung cancer, right ([place])   3. Autoimmune hypothyroidism      Dr. Randa Evens, MD, MPH [facility] [phone] 07/17/2020 8:42 AM

## 2020-07-17 NOTE — Patient Instructions (Signed)
[facility] [place] [facility]  Discharge Instructions: Thank you for choosing [facility] to provide your oncology and hematology care.  If you have a lab appointment with the Cancer Center, please go directly to the Cancer Center and check in at the registration area.  Wear comfortable clothing and clothing appropriate for easy access to any Portacath or PICC line.   We strive to give you quality time with your provider. You may need to reschedule your appointment if you arrive late (15 or more minutes).  Arriving late affects you and other patients whose appointments are after yours.  Also, if you miss three or more appointments without notifying the office, you may be dismissed from the clinic at the provider's discretion.      For prescription refill requests, have your pharmacy contact our office and allow 72 hours for refills to be completed.    Today you received the following chemotherapy and/or immunotherapy agents Tecentriq Atezolizumab injection What is this medicine? ATEZOLIZUMAB (a te zoe LIZ ue mab) is a monoclonal antibody. It is used to treat bladder cancer (urothelial cancer), liver cancer, lung cancer, and melanoma. This medicine may be used for other purposes; ask your health care provider or pharmacist if you have questions. COMMON BRAND NAME(S): Tecentriq What should I tell my health care provider before I take this medicine? They need to know if you have any of these conditions:  autoimmune diseases like Crohn's disease, ulcerative colitis, or lupus  have had or planning to have an allogeneic stem cell transplant (uses someone else's stem cells)  history of organ transplant  history of radiation to the chest  nervous system problems like myasthenia gravis or Guillain-Barre syndrome  an unusual or allergic reaction to atezolizumab, other medicines, foods, dyes, or preservatives  pregnant or trying to get  pregnant  breast-feeding How should I use this medicine? This medicine is for infusion into a vein. It is given by a health care professional in a hospital or clinic setting. A special MedGuide will be given to you before each treatment. Be sure to read this information carefully each time. Talk to your pediatrician regarding the use of this medicine in children. Special care may be needed. Overdosage: If you think you have taken too much of this medicine contact a poison control center or emergency room at once. NOTE: This medicine is only for you. Do not share this medicine with others. What if I miss a dose? It is important not to miss your dose. Call your doctor or health care professional if you are unable to keep an appointment. What may interact with this medicine? Interactions have not been studied. This list may not describe all possible interactions. Give your health care provider a list of all the medicines, herbs, non-prescription drugs, or dietary supplements you use. Also tell them if you smoke, drink alcohol, or use illegal drugs. Some items may interact with your medicine. What should I watch for while using this medicine? Your condition will be monitored carefully while you are receiving this medicine. You may need blood work done while you are taking this medicine. Do not become pregnant while taking this medicine or for at least 5 months after stopping it. Women should inform their doctor if they wish to become pregnant or think they might be pregnant. There is a potential for serious side effects to an unborn child. Talk to your health care professional or pharmacist for more information. Do not breast-feed an infant   while taking this medicine or for at least 5 months after the last dose. What side effects may I notice from receiving this medicine? Side effects that you should report to your doctor or health care professional as soon as possible:  allergic reactions like skin  rash, itching or hives, swelling of the face, lips, or tongue  black, tarry stools  bloody or watery diarrhea  breathing problems  changes in vision  chest pain or chest tightness  chills  facial flushing  fever  headache  signs and symptoms of high blood sugar such as dizziness; dry mouth; dry skin; fruity breath; nausea; stomach pain; increased hunger or thirst; increased urination  signs and symptoms of liver injury like dark yellow or brown urine; general ill feeling or flu-like symptoms; light-colored stools; loss of appetite; nausea; right upper belly pain; unusually weak or tired; yellowing of the eyes or skin  stomach pain  trouble passing urine or change in the amount of urine Side effects that usually do not require medical attention (report to your doctor or health care professional if they continue or are bothersome):  bone pain  cough  diarrhea  joint pain  muscle pain  muscle weakness  swelling of arms or legs  tiredness  weight loss This list may not describe all possible side effects. Call your doctor for medical advice about side effects. You may report side effects to FDA at 1-800-FDA-1088. Where should I keep my medicine? This drug is given in a hospital or clinic and will not be stored at home. NOTE: This sheet is a summary. It may not cover all possible information. If you have questions about this medicine, talk to your doctor, pharmacist, or health care provider.  2021 Elsevier/Gold Standard (2019-11-11 13:59:34)       To help prevent nausea and vomiting after your treatment, we encourage you to take your nausea medication as directed.  BELOW ARE SYMPTOMS THAT SHOULD BE REPORTED IMMEDIATELY: . *FEVER GREATER THAN 100.4 F (38 C) OR HIGHER . *CHILLS OR SWEATING . *NAUSEA AND VOMITING THAT IS NOT CONTROLLED WITH YOUR NAUSEA MEDICATION . *UNUSUAL SHORTNESS OF BREATH . *UNUSUAL BRUISING OR BLEEDING . *URINARY PROBLEMS (pain or burning  when urinating, or frequent urination) . *BOWEL PROBLEMS (unusual diarrhea, constipation, pain near the anus) . TENDERNESS IN MOUTH AND THROAT WITH OR WITHOUT PRESENCE OF ULCERS (sore throat, sores in mouth, or a toothache) . UNUSUAL RASH, SWELLING OR PAIN  . UNUSUAL VAGINAL DISCHARGE OR ITCHING   Items with * indicate a potential emergency and should be followed up as soon as possible or go to the Emergency Department if any problems should occur.  Please show the CHEMOTHERAPY ALERT CARD or IMMUNOTHERAPY ALERT CARD at check-in to the Emergency Department and triage nurse.  Should you have questions after your visit or need to cancel or reschedule your appointment, please contact [facility] [place] [facility]  [phone] and follow the prompts.  Office hours are 8:00 a.m. to 4:30 p.m. Monday - Friday. Please note that voicemails left after 4:00 p.m. may not be returned until the following business day.  We are closed weekends and major holidays. You have access to a nurse at all times for urgent questions. Please call the main number to the clinic [phone] and follow the prompts.  For any non-urgent questions, you may also contact your provider using MyChart. We now offer e-Visits for anyone 18 and older to request care online for non-urgent symptoms. For details visit   [email or web address].   Also download the MyChart app! Go to the app store, search "MyChart", open the app, select [place], and log in with your MyChart username and password.  Due to Covid, a mask is required upon entering the hospital/clinic. If you do not have a mask, one will be given to you upon arrival. For doctor visits, patients may have 1 support person aged 18 or older with them. For treatment visits, patients cannot have anyone with them due to current Covid guidelines and our immunocompromised population.  

## 2020-07-17 NOTE — Progress Notes (Signed)
Patient here for oncology follow-up appointment, expresses concerns of leg numbness and chronic SOB

## 2020-08-07 ENCOUNTER — Inpatient Hospital Stay (HOSPITAL_BASED_OUTPATIENT_CLINIC_OR_DEPARTMENT_OTHER): Payer: Managed Care, Other (non HMO) | Admitting: Oncology

## 2020-08-07 ENCOUNTER — Inpatient Hospital Stay: Payer: Managed Care, Other (non HMO) | Attending: Oncology

## 2020-08-07 ENCOUNTER — Other Ambulatory Visit: Payer: Self-pay | Admitting: *Deleted

## 2020-08-07 ENCOUNTER — Encounter: Payer: Self-pay | Admitting: Oncology

## 2020-08-07 ENCOUNTER — Other Ambulatory Visit: Payer: Self-pay

## 2020-08-07 ENCOUNTER — Inpatient Hospital Stay: Payer: Managed Care, Other (non HMO)

## 2020-08-07 ENCOUNTER — Telehealth: Payer: Self-pay | Admitting: *Deleted

## 2020-08-07 VITALS — BP 98/84 | HR 76 | Temp 97.3°F | Resp 17 | Ht 73.0 in | Wt 330.3 lb

## 2020-08-07 DIAGNOSIS — J449 Chronic obstructive pulmonary disease, unspecified: Secondary | ICD-10-CM | POA: Insufficient documentation

## 2020-08-07 DIAGNOSIS — G4733 Obstructive sleep apnea (adult) (pediatric): Secondary | ICD-10-CM | POA: Insufficient documentation

## 2020-08-07 DIAGNOSIS — C7951 Secondary malignant neoplasm of bone: Secondary | ICD-10-CM | POA: Diagnosis not present

## 2020-08-07 DIAGNOSIS — C782 Secondary malignant neoplasm of pleura: Secondary | ICD-10-CM | POA: Insufficient documentation

## 2020-08-07 DIAGNOSIS — Z5112 Encounter for antineoplastic immunotherapy: Secondary | ICD-10-CM | POA: Diagnosis not present

## 2020-08-07 DIAGNOSIS — C3491 Malignant neoplasm of unspecified part of right bronchus or lung: Secondary | ICD-10-CM

## 2020-08-07 DIAGNOSIS — Z79899 Other long term (current) drug therapy: Secondary | ICD-10-CM | POA: Diagnosis not present

## 2020-08-07 DIAGNOSIS — E063 Autoimmune thyroiditis: Secondary | ICD-10-CM | POA: Diagnosis not present

## 2020-08-07 DIAGNOSIS — C3411 Malignant neoplasm of upper lobe, right bronchus or lung: Secondary | ICD-10-CM | POA: Diagnosis present

## 2020-08-07 DIAGNOSIS — Z95828 Presence of other vascular implants and grafts: Secondary | ICD-10-CM

## 2020-08-07 LAB — CBC WITH DIFFERENTIAL/PLATELET
Abs Immature Granulocytes: 0.06 10*3/uL (ref 0.00–0.07)
Basophils Absolute: 0 10*3/uL (ref 0.0–0.1)
Basophils Relative: 0 %
Eosinophils Absolute: 0.3 10*3/uL (ref 0.0–0.5)
Eosinophils Relative: 3 %
HCT: 45.1 % (ref 39.0–52.0)
Hemoglobin: 14.9 g/dL (ref 13.0–17.0)
Immature Granulocytes: 1 %
Lymphocytes Relative: 18 %
Lymphs Abs: 1.8 10*3/uL (ref 0.7–4.0)
MCH: 32.7 pg (ref 26.0–34.0)
MCHC: 33 g/dL (ref 30.0–36.0)
MCV: 98.9 fL (ref 80.0–100.0)
Monocytes Absolute: 0.6 10*3/uL (ref 0.1–1.0)
Monocytes Relative: 6 %
Neutro Abs: 7.5 10*3/uL (ref 1.7–7.7)
Neutrophils Relative %: 72 %
Platelets: 205 10*3/uL (ref 150–400)
RBC: 4.56 MIL/uL (ref 4.22–5.81)
RDW: 15.5 % (ref 11.5–15.5)
WBC: 10.3 10*3/uL (ref 4.0–10.5)
nRBC: 0 % (ref 0.0–0.2)

## 2020-08-07 LAB — COMPREHENSIVE METABOLIC PANEL
ALT: 22 U/L (ref 0–44)
AST: 25 U/L (ref 15–41)
Albumin: 3.5 g/dL (ref 3.5–5.0)
Alkaline Phosphatase: 70 U/L (ref 38–126)
Anion gap: 9 (ref 5–15)
BUN: 10 mg/dL (ref 8–23)
CO2: 24 mmol/L (ref 22–32)
Calcium: 8.7 mg/dL — ABNORMAL LOW (ref 8.9–10.3)
Chloride: 102 mmol/L (ref 98–111)
Creatinine, Ser: 0.83 mg/dL (ref 0.61–1.24)
GFR, Estimated: >60 mL/min (ref >60)
Glucose, Bld: 211 mg/dL — ABNORMAL HIGH (ref 70–99)
Potassium: 3.7 mmol/L (ref 3.5–5.1)
Sodium: 135 mmol/L (ref 135–145)
Total Bilirubin: 0.4 mg/dL (ref 0.3–1.2)
Total Protein: 7.3 g/dL (ref 6.5–8.1)

## 2020-08-07 LAB — TSH: TSH: 45.234 u[IU]/mL — ABNORMAL HIGH (ref 0.350–4.500)

## 2020-08-07 MED ORDER — HEPARIN SOD (PORK) LOCK FLUSH 100 UNIT/ML IV SOLN
500.0000 [IU] | Freq: Once | INTRAVENOUS | Status: AC
  Administered 2020-08-07: 500 [IU] via INTRAVENOUS
  Filled 2020-08-07: qty 5

## 2020-08-07 MED ORDER — SODIUM CHLORIDE 0.9 % IV SOLN
Freq: Once | INTRAVENOUS | Status: AC
  Filled 2020-08-07: qty 250

## 2020-08-07 MED ORDER — HEPARIN SOD (PORK) LOCK FLUSH 100 UNIT/ML IV SOLN
500.0000 [IU] | Freq: Once | INTRAVENOUS | Status: DC | PRN
  Filled 2020-08-07: qty 5

## 2020-08-07 MED ORDER — LEVOTHYROXINE SODIUM 100 MCG PO TABS: 100.0000 ug | ORAL_TABLET | Freq: Every day | ORAL | 3 refills | Status: DC

## 2020-08-07 MED ORDER — SODIUM CHLORIDE 0.9% FLUSH
10.0000 mL | Freq: Once | INTRAVENOUS | Status: AC
  Administered 2020-08-07: 10 mL via INTRAVENOUS
  Filled 2020-08-07: qty 10

## 2020-08-07 MED ORDER — HEPARIN SOD (PORK) LOCK FLUSH 100 UNIT/ML IV SOLN
INTRAVENOUS | Status: AC
  Filled 2020-08-07: qty 5

## 2020-08-07 MED ORDER — SODIUM CHLORIDE 0.9 % IV SOLN
1200.0000 mg | Freq: Once | INTRAVENOUS | Status: AC
  Administered 2020-08-07: 1200 mg via INTRAVENOUS
  Filled 2020-08-07: qty 20

## 2020-08-07 MED ORDER — LIDOCAINE-PRILOCAINE 2.5-2.5 % EX CREA: TOPICAL_CREAM | CUTANEOUS | 3 refills | Status: DC

## 2020-08-07 NOTE — Telephone Encounter (Signed)
-----   Message from Sindy Guadeloupe, MD sent at 08/07/2020  9:51 AM EDT ----- Increase levothyroxine to 100 mcg

## 2020-08-07 NOTE — Patient Instructions (Signed)
[facility]  Discharge Instructions: Thank you for choosing [facility] to provide your oncology and hematology care.  If you have a lab appointment with the [facility], please go directly to the [facility] and check in at the registration area.  Wear comfortable clothing and clothing appropriate for easy access to any Portacath or PICC line.   We strive to give you quality time with your provider. You may need to reschedule your appointment if you arrive late (15 or more minutes).  Arriving late affects you and other patients whose appointments are after yours.  Also, if you miss three or more appointments without notifying the office, you may be dismissed from the clinic at the provider's discretion.      For prescription refill requests, have your pharmacy contact our office and allow 72 hours for refills to be completed.    Today you received the following chemotherapy and/or immunotherapy agents Tecentriq      To help prevent nausea and vomiting after your treatment, we encourage you to take your nausea medication as directed.  BELOW ARE SYMPTOMS THAT SHOULD BE REPORTED IMMEDIATELY: *FEVER GREATER THAN 100.4 F (38 C) OR HIGHER *CHILLS OR SWEATING *NAUSEA AND VOMITING THAT IS NOT CONTROLLED WITH YOUR NAUSEA MEDICATION *UNUSUAL SHORTNESS OF BREATH *UNUSUAL BRUISING OR BLEEDING *URINARY PROBLEMS (pain or burning when urinating, or frequent urination) *BOWEL PROBLEMS (unusual diarrhea, constipation, pain near the anus) TENDERNESS IN MOUTH AND THROAT WITH OR WITHOUT PRESENCE OF ULCERS (sore throat, sores in mouth, or a toothache) UNUSUAL RASH, SWELLING OR PAIN  UNUSUAL VAGINAL DISCHARGE OR ITCHING   Items with * indicate a potential emergency and should be followed up as soon as possible or go to the Emergency Department if any problems should occur.  Please show the CHEMOTHERAPY ALERT CARD or IMMUNOTHERAPY ALERT CARD at check-in  to the Emergency Department and triage nurse.  Should you have questions after your visit or need to cancel or reschedule your appointment, please contact [facility]  [phone] and follow the prompts.  Office hours are 8:00 a.m. to 4:30 p.m. Monday - Friday. Please note that voicemails left after 4:00 p.m. may not be returned until the following business day.  We are closed weekends and major holidays. You have access to a nurse at all times for urgent questions. Please call the main number to the clinic [phone] and follow the prompts.  For any non-urgent questions, you may also contact your provider using MyChart. We now offer e-Visits for anyone 35 and older to request care online for non-urgent symptoms. For details visit [email or web address].   Also download the MyChart app! Go to the app store, search "MyChart", open the app, select [facility], and log in with your MyChart username and password.  Due to Covid, a mask is required upon entering the hospital/clinic. If you do not have a mask, one will be given to you upon arrival. For doctor visits, patients may have 1 support person aged 61 or older with them. For treatment visits, patients cannot have anyone with them due to current Covid guidelines and our immunocompromised population.

## 2020-08-07 NOTE — Progress Notes (Signed)
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  Past medical history significant for COPD, GERD and obstructive sleep apnea.  CT angio chest abdomen pelvis showed 6.4 x 3.7 cm right paratracheal adenopathy concerning for malignancy.  1.9 cm right hilar node.  12 mm anterior mediastinal lymph node.  2.8 cm right thyroid nodule.  Several pleural-based masses concerning for metastatic disease.  Large irregular opacity in the right upper lobe which may be malignancy versus postobstructive atelectasis.  Lytic lesion noted in the manubrium consistent with metastatic disease.  Moderate right-sided pleural effusion s/p thoracentesis.    Cytology showedPoorly differentiated carcinoma with neuroendocrine differentiation.  Tumor cells were positive for pancytokeratin and CD56 but negative for TTF-1 p40 and synaptophysin.  Chromogranin stain was also negative.   MRI brain was negative for metastatic disease    Interval history-patient reports doing well overall.  He does have some baseline fatigue and exertional shortness of breath which is unchanged.  Appetite and weight have remained stable  ECOG PS- 1 Pain scale-0  Review of systems- Review of Systems  Constitutional:  Positive for malaise/fatigue. Negative for chills, fever and weight loss.  HENT:  Negative for congestion,  ear discharge and nosebleeds.   Eyes:  Negative for blurred vision.  Respiratory:  Positive for shortness of breath. Negative for cough, hemoptysis, sputum production and wheezing.   Cardiovascular:  Negative for chest pain, palpitations, orthopnea and claudication.  Gastrointestinal:  Negative for abdominal pain, blood in stool, constipation, diarrhea, heartburn, melena, nausea and vomiting.  Genitourinary:  Negative for dysuria, flank pain, frequency, hematuria and urgency.  Musculoskeletal:  Negative for back pain, joint pain and myalgias.  Skin:  Negative for rash.  Neurological:  Negative for dizziness, tingling, focal weakness, seizures, weakness and headaches.  Endo/Heme/Allergies:  Does not bruise/bleed easily.  Psychiatric/Behavioral:  Negative for depression and suicidal ideas. The patient does not have insomnia.     Allergies  Allergen Reactions   Cephalexin Rash     Past Medical History:  Diagnosis Date   Adenomatous polyps    Asthma    Barrett's esophagus    Colon polyp    COPD (chronic obstructive pulmonary disease) ([place])    uses inhaler, "i may have COPD"   Dyspnea    Eye injury    GERD (gastroesophageal reflux disease)    History of gonorrhea    History of pneumonia    Lung cancer ([place])    Obesity    Pneumonia    Reflux esophagitis    Sleep apnea      Past Surgical History:  Procedure Laterality Date   CATARACT EXTRACTION Left    COLONOSCOPY     COLONOSCOPY WITH PROPOFOL N/A 05/19/2015   Procedure: COLONOSCOPY WITH PROPOFOL;  Surgeon: [name], MD;  Location: [facility] ENDOSCOPY;  Service: Endoscopy;  Laterality: N/A;   COLONOSCOPY WITH PROPOFOL N/A 09/11/2018   Procedure: COLONOSCOPY WITH PROPOFOL;  Surgeon: [name], MD;  Location: [facility] ENDOSCOPY;  Service: Endoscopy;  Laterality: N/A;   ESOPHAGOGASTRODUODENOSCOPY (EGD) WITH PROPOFOL N/A 05/19/2015   Procedure: ESOPHAGOGASTRODUODENOSCOPY (EGD) WITH PROPOFOL;  Surgeon: [name], MD;   Location: [facility] ENDOSCOPY;  Service: Endoscopy;  Laterality: N/A;   ESOPHAGOGASTRODUODENOSCOPY (EGD) WITH PROPOFOL N/A 10/11/2016   Procedure: ESOPHAGOGASTRODUODENOSCOPY (EGD) WITH PROPOFOL;  Surgeon: [name], MD;  Location: [facility] ENDOSCOPY;  Service: Endoscopy;  Laterality: N/A;   ESOPHAGOGASTRODUODENOSCOPY (EGD) WITH PROPOFOL N/A 01/01/2018   Procedure: ESOPHAGOGASTRODUODENOSCOPY (EGD) WITH PROPOFOL;  Surgeon: [name], MD;  Location: [facility] ENDOSCOPY;  Service: Endoscopy;  Laterality: N/A;   ESOPHAGOGASTRODUODENOSCOPY (EGD) WITH PROPOFOL N/A 09/11/2018   Procedure: ESOPHAGOGASTRODUODENOSCOPY (EGD) WITH PROPOFOL;  Surgeon: [name], MD;  Location: [facility] ENDOSCOPY;  Service: Endoscopy;  Laterality: N/A;   EYE SURGERY     IR IMAGING GUIDED PORT INSERTION  04/21/2020    Social History   Socioeconomic History   Marital status: Married    Spouse name: Not on file   Number of children: Not on file   Years of education: Not on file   Highest education level: Not on file  Occupational History   Not on file  Tobacco Use   Smoking status: Every Day    Packs/day: 1.50    Years: 35.00    Pack years: 52.50    Types: Cigarettes, Cigars    Last attempt to quit: 03/04/2015    Years since quitting: 5.4   Smokeless tobacco: Never  Vaping Use   Vaping Use: Former  Substance and Sexual Activity   Alcohol use: Not Currently    Comment: none or 3 weeks    Drug use: No   Sexual activity: Not on file  Other Topics Concern   Not on file  Social History Narrative   Not on file   Social Determinants of Health   Financial Resource Strain: Not on file  Food Insecurity: Not on file  Transportation Needs: Not on file  Physical Activity: Not on file  Stress: Not on file  Social Connections: Not on file  Intimate Partner Violence: Not on file    Family History  Problem Relation Age of Onset   Emphysema Mother      Current Outpatient Medications:    albuterol  (PROVENTIL HFA;VENTOLIN HFA) 108 (90 Base) MCG/ACT inhaler, 2 puffs q.i.d. p.r.n. short of breath, wheezing, or cough, Disp: , Rfl:    levothyroxine (SYNTHROID) 75 MCG tablet, Take 1 tablet (75 mcg total) by mouth daily before breakfast., Disp: 30 tablet, Rfl: 2   LORazepam (ATIVAN) 0.5 MG tablet, Take 1 tablet (0.5 mg total) by mouth 3 (three) times daily as needed (Nausea or vomiting)., Disp: 30 tablet, Rfl: 0   Morphine Sulfate, Concentrate, 5 MG/0.25ML SOLN, Take 5 mg by mouth every 4 (four) hours as needed. For dyspnea, Disp: 30 mL, Rfl: 0   ondansetron (ZOFRAN) 8 MG tablet, Take 1 tablet (8 mg total) by mouth 2 (two) times daily as needed for refractory nausea / vomiting. Start on day 3 after carboplatin chemo., Disp: 30 tablet, Rfl: 1   prochlorperazine (COMPAZINE) 10 MG tablet, Take 1 tablet (10 mg total) by mouth every 6 (six) hours as needed (Nausea or vomiting)., Disp: 30 tablet, Rfl: 1   lidocaine-prilocaine (EMLA) cream, Apply to affected area once, Disp: 30 g, Rfl: 3 No current facility-administered medications for this visit.  Facility-Administered Medications Ordered in Other Visits:    heparin lock flush 100 unit/mL,  500 Units, Intracatheter, Once PRN, Sindy Guadeloupe, MD  Physical exam:  Vitals:   08/07/20 0851  BP: 98/84  Pulse: 76  Resp: 17  Temp: (!) 97.3 F (36.3 C)  SpO2: 97%  Weight: (!) 330 lb 4.8 oz (149.8 kg)  Height: 6\' 1"  (1.854 m)   Physical Exam Constitutional:      Comments: Sitting in a wheelchair.  Appears in no acute distress  Cardiovascular:     Rate and Rhythm: Normal rate and regular rhythm.     Heart sounds: Normal heart sounds.  Pulmonary:     Effort: Pulmonary effort is normal.     Breath sounds: Normal breath sounds.  Skin:    General: Skin is warm and dry.  Neurological:     Mental Status: He is alert and oriented to person, place, and time.     CMP Latest Ref Rng & Units 08/07/2020  Glucose 70 - 99 mg/dL 211(H)  BUN 8 - 23 mg/dL 10   Creatinine 0.61 - 1.24 mg/dL 0.83  Sodium 135 - 145 mmol/L 135  Potassium 3.5 - 5.1 mmol/L 3.7  Chloride 98 - 111 mmol/L 102  CO2 22 - 32 mmol/L 24  Calcium 8.9 - 10.3 mg/dL 8.7(L)  Total Protein 6.5 - 8.1 g/dL 7.3  Total Bilirubin 0.3 - 1.2 mg/dL 0.4  Alkaline Phos 38 - 126 U/L 70  AST 15 - 41 U/L 25  ALT 0 - 44 U/L 22   CBC Latest Ref Rng & Units 08/07/2020  WBC 4.0 - 10.5 K/uL 10.3  Hemoglobin 13.0 - 17.0 g/dL 14.9  Hematocrit 39.0 - 52.0 % 45.1  Platelets 150 - 400 K/uL 205      Assessment and plan- Patient is a 75 y.o. 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  He is here for on treatment assessment prior to cycle 2 of maintenance Tecentriq chemotherapy  Counts okay to proceed with cycle 2 of maintenance Tecentriq chemotherapy today.  I will see him back in 3 weeks for cycle 3.  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  He is currently on levothyroxine 75 mcg.  TSH today came back elevated at 45 pretty much unchanged as compared to a month ago.  We will increase his levothyroxine dose 200 mcg   Visit Diagnosis 1. Encounter for antineoplastic immunotherapy   2. Small cell lung cancer, right ([place])   3. Autoimmune hypothyroidism      Dr. Randa Evens, MD, MPH [facility] [phone] 08/07/2020 1:55 PM

## 2020-08-07 NOTE — Telephone Encounter (Signed)
Called pt and let him know that the TSH level went up so we need to increase thyroid med to 100 mcg daily. He is ok with that and I sent new rx to pharmacy

## 2020-08-10 ENCOUNTER — Encounter: Payer: Managed Care, Other (non HMO) | Admitting: Dermatology

## 2020-08-29 ENCOUNTER — Inpatient Hospital Stay (HOSPITAL_BASED_OUTPATIENT_CLINIC_OR_DEPARTMENT_OTHER): Payer: Managed Care, Other (non HMO) | Admitting: Oncology

## 2020-08-29 ENCOUNTER — Inpatient Hospital Stay: Payer: Managed Care, Other (non HMO)

## 2020-08-29 ENCOUNTER — Inpatient Hospital Stay: Payer: Managed Care, Other (non HMO) | Attending: Oncology

## 2020-08-29 VITALS — BP 134/66 | HR 72 | Temp 97.4°F | Resp 20 | Wt 329.5 lb

## 2020-08-29 DIAGNOSIS — G893 Neoplasm related pain (acute) (chronic): Secondary | ICD-10-CM | POA: Insufficient documentation

## 2020-08-29 DIAGNOSIS — E041 Nontoxic single thyroid nodule: Secondary | ICD-10-CM | POA: Diagnosis not present

## 2020-08-29 DIAGNOSIS — Z7983 Long term (current) use of bisphosphonates: Secondary | ICD-10-CM

## 2020-08-29 DIAGNOSIS — C7951 Secondary malignant neoplasm of bone: Secondary | ICD-10-CM | POA: Diagnosis present

## 2020-08-29 DIAGNOSIS — Z5112 Encounter for antineoplastic immunotherapy: Secondary | ICD-10-CM | POA: Insufficient documentation

## 2020-08-29 DIAGNOSIS — Z5181 Encounter for therapeutic drug level monitoring: Secondary | ICD-10-CM

## 2020-08-29 DIAGNOSIS — G4733 Obstructive sleep apnea (adult) (pediatric): Secondary | ICD-10-CM | POA: Insufficient documentation

## 2020-08-29 DIAGNOSIS — Z79899 Other long term (current) drug therapy: Secondary | ICD-10-CM | POA: Diagnosis not present

## 2020-08-29 DIAGNOSIS — J449 Chronic obstructive pulmonary disease, unspecified: Secondary | ICD-10-CM | POA: Diagnosis not present

## 2020-08-29 DIAGNOSIS — C3491 Malignant neoplasm of unspecified part of right bronchus or lung: Secondary | ICD-10-CM | POA: Diagnosis not present

## 2020-08-29 DIAGNOSIS — D751 Secondary polycythemia: Secondary | ICD-10-CM

## 2020-08-29 DIAGNOSIS — C3411 Malignant neoplasm of upper lobe, right bronchus or lung: Secondary | ICD-10-CM | POA: Diagnosis present

## 2020-08-29 LAB — CBC WITH DIFFERENTIAL/PLATELET
Abs Immature Granulocytes: 0.07 10*3/uL (ref 0.00–0.07)
Basophils Absolute: 0 10*3/uL (ref 0.0–0.1)
Basophils Relative: 1 %
Eosinophils Absolute: 0.1 10*3/uL (ref 0.0–0.5)
Eosinophils Relative: 2 %
HCT: 47.8 % (ref 39.0–52.0)
Hemoglobin: 15.6 g/dL (ref 13.0–17.0)
Immature Granulocytes: 1 %
Lymphocytes Relative: 19 %
Lymphs Abs: 1.6 10*3/uL (ref 0.7–4.0)
MCH: 31.8 pg (ref 26.0–34.0)
MCHC: 32.6 g/dL (ref 30.0–36.0)
MCV: 97.6 fL (ref 80.0–100.0)
Monocytes Absolute: 0.5 10*3/uL (ref 0.1–1.0)
Monocytes Relative: 6 %
Neutro Abs: 6 10*3/uL (ref 1.7–7.7)
Neutrophils Relative %: 71 %
Platelets: 195 10*3/uL (ref 150–400)
RBC: 4.9 MIL/uL (ref 4.22–5.81)
RDW: 13.9 % (ref 11.5–15.5)
WBC: 8.4 10*3/uL (ref 4.0–10.5)
nRBC: 0 % (ref 0.0–0.2)

## 2020-08-29 LAB — COMPREHENSIVE METABOLIC PANEL
ALT: 24 U/L (ref 0–44)
AST: 29 U/L (ref 15–41)
Albumin: 3.5 g/dL (ref 3.5–5.0)
Alkaline Phosphatase: 62 U/L (ref 38–126)
Anion gap: 8 (ref 5–15)
BUN: 14 mg/dL (ref 8–23)
CO2: 25 mmol/L (ref 22–32)
Calcium: 9.1 mg/dL (ref 8.9–10.3)
Chloride: 100 mmol/L (ref 98–111)
Creatinine, Ser: 0.81 mg/dL (ref 0.61–1.24)
GFR, Estimated: >60 mL/min (ref >60)
Glucose, Bld: 169 mg/dL — ABNORMAL HIGH (ref 70–99)
Potassium: 3.8 mmol/L (ref 3.5–5.1)
Sodium: 133 mmol/L — ABNORMAL LOW (ref 135–145)
Total Bilirubin: 0.6 mg/dL (ref 0.3–1.2)
Total Protein: 7.5 g/dL (ref 6.5–8.1)

## 2020-08-29 MED ORDER — SODIUM CHLORIDE 0.9% FLUSH
10.0000 mL | INTRAVENOUS | Status: DC | PRN
  Administered 2020-08-29: 10 mL via INTRAVENOUS
  Filled 2020-08-29: qty 10

## 2020-08-29 MED ORDER — SODIUM CHLORIDE 0.9 % IV SOLN
Freq: Once | INTRAVENOUS | Status: AC
Start: 2020-08-29 — End: 2020-08-29
  Filled 2020-08-29: qty 250

## 2020-08-29 MED ORDER — ZOLEDRONIC ACID 4 MG/100ML IV SOLN
4.0000 mg | Freq: Once | INTRAVENOUS | Status: AC
  Administered 2020-08-29: 4 mg via INTRAVENOUS
  Filled 2020-08-29: qty 100

## 2020-08-29 MED ORDER — SODIUM CHLORIDE 0.9 % IV SOLN
1200.0000 mg | Freq: Once | INTRAVENOUS | Status: AC
  Administered 2020-08-29: 1200 mg via INTRAVENOUS
  Filled 2020-08-29: qty 20

## 2020-08-29 MED ORDER — HEPARIN SOD (PORK) LOCK FLUSH 100 UNIT/ML IV SOLN
INTRAVENOUS | Status: AC
  Filled 2020-08-29: qty 5

## 2020-08-29 MED ORDER — HEPARIN SOD (PORK) LOCK FLUSH 100 UNIT/ML IV SOLN
500.0000 [IU] | Freq: Once | INTRAVENOUS | Status: AC
  Administered 2020-08-29: 500 [IU] via INTRAVENOUS
  Filled 2020-08-29: qty 5

## 2020-08-29 NOTE — Progress Notes (Signed)
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  Past medical history significant for COPD, GERD and obstructive sleep apnea.  CT angio chest abdomen pelvis showed 6.4 x 3.7 cm right paratracheal adenopathy concerning for malignancy.  1.9 cm right hilar node.  12 mm anterior mediastinal lymph node.  2.8 cm right thyroid nodule.  Several pleural-based masses concerning for metastatic disease.  Large irregular opacity in the right upper lobe which may be malignancy versus postobstructive atelectasis.  Lytic lesion noted in the manubrium consistent with metastatic disease.  Moderate right-sided pleural effusion s/p thoracentesis.    Cytology showedPoorly differentiated carcinoma with neuroendocrine differentiation.  Tumor cells were positive for pancytokeratin and CD56 but negative for TTF-1 p40 and synaptophysin.  Chromogranin stain was also negative.   MRI brain was negative for metastatic disease  Interval history-patient is doing well on chemotherapy so far.  He has baseline fatigue and exertional shortness of breath which has remained stable.  He reports having intense muscle aches in his bilateral legs as well as groin which comes on randomly and lasts for a day.  He has tried Tylenol and meloxicam for this pain but has not helped him so far.  ECOG PS- 1 Pain scale-  0   Review of systems- Review of Systems  Constitutional:  Positive for malaise/fatigue. Negative for chills, fever and weight loss.  HENT:  Negative for congestion, ear discharge and nosebleeds.   Eyes:  Negative for blurred vision.  Respiratory:  Positive for shortness of breath. Negative for cough, hemoptysis, sputum production and wheezing.   Cardiovascular:  Negative for chest pain, palpitations, orthopnea and claudication.  Gastrointestinal:  Negative for abdominal pain, blood in stool, constipation, diarrhea, heartburn, melena, nausea and vomiting.  Genitourinary:  Negative for dysuria, flank pain, frequency, hematuria and urgency.  Musculoskeletal:  Negative for back pain, joint pain and myalgias.  Skin:  Negative for rash.  Neurological:  Negative for dizziness, tingling, focal weakness, seizures, weakness and headaches.  Endo/Heme/Allergies:  Does not bruise/bleed easily.  Psychiatric/Behavioral:  Negative for depression and suicidal ideas. The patient does not have insomnia.       Allergies  Allergen Reactions   Cephalexin Rash     Past Medical History:  Diagnosis Date   Adenomatous polyps    Asthma    Barrett's esophagus    Colon polyp    COPD (chronic obstructive pulmonary disease) ([place])    uses inhaler, "i may have COPD"   Dyspnea    Eye injury    GERD (gastroesophageal reflux disease)    History of gonorrhea    History of pneumonia    Lung cancer ([place])    Obesity    Pneumonia    Reflux esophagitis    Sleep apnea      Past Surgical History:  Procedure Laterality Date   CATARACT EXTRACTION Left    COLONOSCOPY     COLONOSCOPY WITH  PROPOFOL N/A 05/19/2015   Procedure: COLONOSCOPY WITH PROPOFOL;  Surgeon: [name], MD;  Location: [facility] ENDOSCOPY;  Service: Endoscopy;  Laterality: N/A;   COLONOSCOPY WITH PROPOFOL N/A 09/11/2018   Procedure: COLONOSCOPY WITH PROPOFOL;  Surgeon: [name], MD;  Location: [facility] ENDOSCOPY;  Service: Endoscopy;   Laterality: N/A;   ESOPHAGOGASTRODUODENOSCOPY (EGD) WITH PROPOFOL N/A 05/19/2015   Procedure: ESOPHAGOGASTRODUODENOSCOPY (EGD) WITH PROPOFOL;  Surgeon: [name], MD;  Location[facility] ENDOSCOPY;  Service: Endoscopy;  Laterality: N/A;   ESOPHAGOGASTRODUODENOSCOPY (EGD) WITH PROPOFOL N/A 10/11/2016   Procedure: ESOPHAGOGASTRODUODENOSCOPY (EGD) WITH PROPOFOL;  Surgeon: [name], MD;  Location: [facility] ENDOSCOPY;  Service: Endoscopy;  Laterality: N/A;   ESOPHAGOGASTRODUODENOSCOPY (EGD) WITH PROPOFOL N/A 01/01/2018   Procedure: ESOPHAGOGASTRODUODENOSCOPY (EGD) WITH PROPOFOL;  Surgeon: [name], MD;  Location: [facility] ENDOSCOPY;  Service: Endoscopy;  Laterality: N/A;   ESOPHAGOGASTRODUODENOSCOPY (EGD) WITH PROPOFOL N/A 09/11/2018   Procedure: ESOPHAGOGASTRODUODENOSCOPY (EGD) WITH PROPOFOL;  Surgeon: [name], MD;  Location: [facility] ENDOSCOPY;  Service: Endoscopy;  Laterality: N/A;   EYE SURGERY     IR IMAGING GUIDED PORT INSERTION  04/21/2020    Social History   Socioeconomic History   Marital status: Married    Spouse name: Not on file   Number of children: Not on file   Years of education: Not on file   Highest education level: Not on file  Occupational History   Not on file  Tobacco Use   Smoking status: Every Day    Packs/day: 1.50    Years: 35.00    Pack years: 52.50    Types: Cigarettes, Cigars    Last attempt to quit: 03/04/2015    Years since quitting: 5.4   Smokeless tobacco: Never  Vaping Use   Vaping Use: Former  Substance and Sexual Activity   Alcohol use: Not Currently    Comment: none or 3 weeks    Drug use: No   Sexual activity: Not on file  Other Topics Concern   Not on file  Social History Narrative   Not on file   Social Determinants of Health   Financial Resource Strain: Not on file  Food Insecurity: Not on file  Transportation Needs: Not on file  Physical Activity: Not on file  Stress: Not on file  Social Connections: Not on file   Intimate Partner Violence: Not on file    Family History  Problem Relation Age of Onset   Emphysema Mother      Current Outpatient Medications:    albuterol (PROVENTIL HFA;VENTOLIN HFA) 108 (90 Base) MCG/ACT inhaler, 2 puffs q.i.d. p.r.n. short of breath, wheezing, or cough, Disp: , Rfl:    levothyroxine (SYNTHROID) 100 MCG tablet, Take 1 tablet (100 mcg total) by mouth daily before breakfast., Disp: 30 tablet, Rfl: 3   lidocaine-prilocaine (EMLA) cream, Apply to affected area once, Disp: 30 g, Rfl: 3   LORazepam (ATIVAN) 0.5 MG tablet, Take 1 tablet (0.5 mg total) by mouth 3 (three) times daily as needed (Nausea or vomiting)., Disp: 30 tablet, Rfl: 0   Morphine Sulfate, Concentrate, 5 MG/0.25ML SOLN, Take 5 mg by mouth every 4 (four) hours as needed. For dyspnea, Disp: 30 mL, Rfl: 0   ondansetron (ZOFRAN) 8 MG tablet, Take 1 tablet (8 mg total) by mouth 2 (two) times daily as needed for refractory nausea / vomiting. Start on day 3 after carboplatin chemo., Disp: 30 tablet, Rfl: 1   prochlorperazine (COMPAZINE) 10 MG tablet, Take 1 tablet (10 mg  total) by mouth every 6 (six) hours as needed (Nausea or vomiting)., Disp: 30 tablet, Rfl: 1 No current facility-administered medications for this visit.  Facility-Administered Medications Ordered in Other Visits:    heparin lock flush 100 unit/mL, 500 Units, Intravenous, Once, Sindy Guadeloupe, MD   sodium chloride flush (NS) 0.9 % injection 10 mL, 10 mL, Intravenous, PRN, Sindy Guadeloupe, MD, 10 mL at 08/29/20 0914  Physical exam:  Vitals:   08/29/20 0954  BP: 134/66  Pulse: 72  Resp: 20  Temp: (!) 97.4 F (36.3 C)  TempSrc: Tympanic  SpO2: 95%  Weight: (!) 329 lb 8 oz (149.5 kg)   Physical Exam Constitutional:      Comments: Sitting in a wheelchair.  Appears in no acute distress.  Ambulates with a cane  Cardiovascular:     Rate and Rhythm: Normal rate and regular rhythm.     Heart sounds: Normal heart sounds.  Pulmonary:      Effort: Pulmonary effort is normal.     Breath sounds: Normal breath sounds.  Abdominal:     General: Bowel sounds are normal.     Palpations: Abdomen is soft.  Skin:    General: Skin is warm and dry.  Neurological:     Mental Status: He is alert and oriented to person, place, and time.     CMP Latest Ref Rng & Units 08/29/2020  Glucose 70 - 99 mg/dL 169(H)  BUN 8 - 23 mg/dL 14  Creatinine 0.61 - 1.24 mg/dL 0.81  Sodium 135 - 145 mmol/L 133(L)  Potassium 3.5 - 5.1 mmol/L 3.8  Chloride 98 - 111 mmol/L 100  CO2 22 - 32 mmol/L 25  Calcium 8.9 - 10.3 mg/dL 9.1  Total Protein 6.5 - 8.1 g/dL 7.5  Total Bilirubin 0.3 - 1.2 mg/dL 0.6  Alkaline Phos 38 - 126 U/L 62  AST 15 - 41 U/L 29  ALT 0 - 44 U/L 24   CBC Latest Ref Rng & Units 08/29/2020  WBC 4.0 - 10.5 K/uL 8.4  Hemoglobin 13.0 - 17.0 g/dL 15.6  Hematocrit 39.0 - 52.0 % 47.8  Platelets 150 - 400 K/uL 195     Assessment and plan- Patient is a 75 y.o. male with extensive stage small cell lung cancer s/p 4 cycles of carbo etoposide Tecentriq chemotherapy here for on treatment assessment prior to cycle 3 of maintenance Tecentriq  Counts okay to proceed with cycle 3 of maintenance Tecentriq today.  I will see him back in 3 weeks for cycle 4.  Plan to repeat CT chest abdomen pelvis with contrast and bone scan after 4 cycles  Myalgias/body aches after Tecentriq: I have asked him to take as needed morphine which we had prescribed to him previously and see if it helps his pain  Patient will receive Zometa today for his bone mets   Visit Diagnosis 1. Encounter for antineoplastic immunotherapy   2. Small cell lung cancer, right ([place])   3. Encounter for monitoring zoledronic acid therapy      Dr. Randa Evens, MD, MPH [facility] [phone] 08/29/2020 1:07 PM

## 2020-08-29 NOTE — Patient Instructions (Signed)
[facility] ONCOLOGY   Discharge Instructions: Thank you for choosing [facility] to provide your oncology and hematology care.  If you have a lab appointment with the [facility], please go directly to the [facility] and check in at the registration area.  Wear comfortable clothing and clothing appropriate for easy access to any Portacath or PICC line.   We strive to give you quality time with your provider. You may need to reschedule your appointment if you arrive late (15 or more minutes).  Arriving late affects you and other patients whose appointments are after yours.  Also, if you miss three or more appointments without notifying the office, you may be dismissed from the clinic at the provider's discretion.      For prescription refill requests, have your pharmacy contact our office and allow 72 hours for refills to be completed.    Today you received the following chemotherapy and/or immunotherapy agents: Tecentriq. Today you also received the following: Zometa.      To help prevent nausea and vomiting after your treatment, we encourage you to take your nausea medication as directed.  BELOW ARE SYMPTOMS THAT SHOULD BE REPORTED IMMEDIATELY: *FEVER GREATER THAN 100.4 F (38 C) OR HIGHER *CHILLS OR SWEATING *NAUSEA AND VOMITING THAT IS NOT CONTROLLED WITH YOUR NAUSEA MEDICATION *UNUSUAL SHORTNESS OF BREATH *UNUSUAL BRUISING OR BLEEDING *URINARY PROBLEMS (pain or burning when urinating, or frequent urination) *BOWEL PROBLEMS (unusual diarrhea, constipation, pain near the anus) TENDERNESS IN MOUTH AND THROAT WITH OR WITHOUT PRESENCE OF ULCERS (sore throat, sores in mouth, or a toothache) UNUSUAL RASH, SWELLING OR PAIN  UNUSUAL VAGINAL DISCHARGE OR ITCHING   Items with * indicate a potential emergency and should be followed up as soon as possible or go to the Emergency Department if any problems should occur.  Please show the CHEMOTHERAPY  ALERT CARD or IMMUNOTHERAPY ALERT CARD at check-in to the Emergency Department and triage nurse.  Should you have questions after your visit or need to cancel or reschedule your appointment, please contact [facility]  [phone] and follow the prompts.  Office hours are 8:00 a.m. to 4:30 p.m. Monday - Friday. Please note that voicemails left after 4:00 p.m. may not be returned until the following business day.  We are closed weekends and major holidays. You have access to a nurse at all times for urgent questions. Please call the main number to the clinic [phone] and follow the prompts.  For any non-urgent questions, you may also contact your provider using MyChart. We now offer e-Visits for anyone 74 and older to request care online for non-urgent symptoms. For details visit [email or web address].   Also download the MyChart app! Go to the app store, search "MyChart", open the app, select [facility], and log in with your MyChart username and password.  Due to Covid, a mask is required upon entering the hospital/clinic. If you do not have a mask, one will be given to you upon arrival. For doctor visits, patients may have 1 support person aged 74 or older with them. For treatment visits, patients cannot have anyone with them due to current Covid guidelines and our immunocompromised population.

## 2020-09-13 ENCOUNTER — Telehealth: Payer: Managed Care, Other (non HMO) | Admitting: Hospice and Palliative Medicine

## 2020-09-14 ENCOUNTER — Encounter: Payer: Self-pay | Admitting: Oncology

## 2020-09-14 ENCOUNTER — Telehealth: Payer: Self-pay | Admitting: *Deleted

## 2020-09-14 NOTE — Telephone Encounter (Signed)
Pt send my chart message and I wanted to speak to him to get more info. The patient has pain. It starts at lower back then can radiate to lower extremities or center of chest. Pain level usually between 3-5 and if he adjusts him self in position he can get it to pain 1-3 but then continuously ever 15 min change position again. He also says that his fatigue is getting worse. He states that he made a video of several movies back to back that he loves to watch he would use it to put him to sleep and now he is so uncomfortable because of pain it does not work and sometimes 1 hour of sleep at time he may get 2-3 times like that of sleep.  He also has night sweats. He has tried delta 8 for pain control and it takes 1-2 hours to kick in but also has side effects: cognitive abilities , loss of focus, being "high".He tried [place] marijuana and it reduced pain to allow a few  cat naps. He would prefer a  pain med that does not have side effects. I asked specifically about chest pain and he said after talking to ER staff his pain was not chest pain. I offered him appt for tom with [facility] and he will wait to see rao on monday

## 2020-09-18 ENCOUNTER — Inpatient Hospital Stay: Payer: Managed Care, Other (non HMO)

## 2020-09-18 ENCOUNTER — Inpatient Hospital Stay: Payer: Managed Care, Other (non HMO) | Admitting: Hospice and Palliative Medicine

## 2020-09-18 ENCOUNTER — Other Ambulatory Visit: Payer: Self-pay | Admitting: *Deleted

## 2020-09-18 ENCOUNTER — Inpatient Hospital Stay (HOSPITAL_BASED_OUTPATIENT_CLINIC_OR_DEPARTMENT_OTHER): Payer: Managed Care, Other (non HMO) | Admitting: Oncology

## 2020-09-18 ENCOUNTER — Encounter: Payer: Self-pay | Admitting: Oncology

## 2020-09-18 ENCOUNTER — Other Ambulatory Visit: Payer: Self-pay

## 2020-09-18 ENCOUNTER — Telehealth: Payer: Managed Care, Other (non HMO) | Admitting: Hospice and Palliative Medicine

## 2020-09-18 VITALS — BP 107/63 | HR 79 | Temp 97.7°F | Resp 18 | Wt 323.9 lb

## 2020-09-18 DIAGNOSIS — R0789 Other chest pain: Secondary | ICD-10-CM | POA: Diagnosis not present

## 2020-09-18 DIAGNOSIS — C3491 Malignant neoplasm of unspecified part of right bronchus or lung: Secondary | ICD-10-CM

## 2020-09-18 DIAGNOSIS — R531 Weakness: Secondary | ICD-10-CM

## 2020-09-18 DIAGNOSIS — G893 Neoplasm related pain (acute) (chronic): Secondary | ICD-10-CM

## 2020-09-18 DIAGNOSIS — Z5112 Encounter for antineoplastic immunotherapy: Secondary | ICD-10-CM | POA: Diagnosis not present

## 2020-09-18 DIAGNOSIS — E063 Autoimmune thyroiditis: Secondary | ICD-10-CM | POA: Diagnosis not present

## 2020-09-18 LAB — COMPREHENSIVE METABOLIC PANEL
ALT: 26 U/L (ref 0–44)
AST: 30 U/L (ref 15–41)
Albumin: 3.1 g/dL — ABNORMAL LOW (ref 3.5–5.0)
Alkaline Phosphatase: 73 U/L (ref 38–126)
Anion gap: 8 (ref 5–15)
BUN: 14 mg/dL (ref 8–23)
CO2: 25 mmol/L (ref 22–32)
Calcium: 9 mg/dL (ref 8.9–10.3)
Chloride: 103 mmol/L (ref 98–111)
Creatinine, Ser: 0.75 mg/dL (ref 0.61–1.24)
GFR, Estimated: >60 mL/min (ref >60)
Glucose, Bld: 109 mg/dL — ABNORMAL HIGH (ref 70–99)
Potassium: 3.6 mmol/L (ref 3.5–5.1)
Sodium: 136 mmol/L (ref 135–145)
Total Bilirubin: 0.5 mg/dL (ref 0.3–1.2)
Total Protein: 7.1 g/dL (ref 6.5–8.1)

## 2020-09-18 LAB — CBC WITH DIFFERENTIAL/PLATELET
Abs Immature Granulocytes: 0.23 10*3/uL — ABNORMAL HIGH (ref 0.00–0.07)
Basophils Absolute: 0.1 10*3/uL (ref 0.0–0.1)
Basophils Relative: 1 %
Eosinophils Absolute: 0.1 10*3/uL (ref 0.0–0.5)
Eosinophils Relative: 1 %
HCT: 47.4 % (ref 39.0–52.0)
Hemoglobin: 15.7 g/dL (ref 13.0–17.0)
Immature Granulocytes: 2 %
Lymphocytes Relative: 19 %
Lymphs Abs: 1.9 10*3/uL (ref 0.7–4.0)
MCH: 31.9 pg (ref 26.0–34.0)
MCHC: 33.1 g/dL (ref 30.0–36.0)
MCV: 96.3 fL (ref 80.0–100.0)
Monocytes Absolute: 0.6 10*3/uL (ref 0.1–1.0)
Monocytes Relative: 6 %
Neutro Abs: 7.1 10*3/uL (ref 1.7–7.7)
Neutrophils Relative %: 71 %
Platelets: 223 10*3/uL (ref 150–400)
RBC: 4.92 MIL/uL (ref 4.22–5.81)
RDW: 13.7 % (ref 11.5–15.5)
WBC: 10 10*3/uL (ref 4.0–10.5)
nRBC: 0 % (ref 0.0–0.2)

## 2020-09-18 LAB — TSH: TSH: 46.001 u[IU]/mL — ABNORMAL HIGH (ref 0.350–4.500)

## 2020-09-18 MED ORDER — MORPHINE SULFATE (CONCENTRATE) 5 MG/0.25ML PO SOLN: 5.0000 mg | ORAL | 0 refills | Status: DC | PRN

## 2020-09-18 MED ORDER — SODIUM CHLORIDE 0.9 % IV SOLN
Freq: Once | INTRAVENOUS | Status: AC
  Filled 2020-09-18: qty 250

## 2020-09-18 MED ORDER — LEVOTHYROXINE SODIUM 150 MCG PO TABS: 150.0000 ug | ORAL_TABLET | Freq: Every day | ORAL | 2 refills | Status: AC

## 2020-09-18 MED ORDER — HEPARIN SOD (PORK) LOCK FLUSH 100 UNIT/ML IV SOLN
INTRAVENOUS | Status: AC
  Filled 2020-09-18: qty 5

## 2020-09-18 MED ORDER — FENTANYL 12 MCG/HR TD PT72: 1.0000 | MEDICATED_PATCH | TRANSDERMAL | 0 refills | Status: DC

## 2020-09-18 MED ORDER — SODIUM CHLORIDE 0.9 % IV SOLN
1200.0000 mg | Freq: Once | INTRAVENOUS | Status: AC
  Administered 2020-09-18: 1200 mg via INTRAVENOUS
  Filled 2020-09-18: qty 20

## 2020-09-18 MED ORDER — HEPARIN SOD (PORK) LOCK FLUSH 100 UNIT/ML IV SOLN
500.0000 [IU] | Freq: Once | INTRAVENOUS | Status: AC | PRN
  Administered 2020-09-18: 500 [IU]
  Filled 2020-09-18: qty 5

## 2020-09-18 MED ORDER — LEVOTHYROXINE SODIUM 150 MCG PO TABS: 150.0000 ug | ORAL_TABLET | Freq: Every day | ORAL | 2 refills | Status: DC

## 2020-09-18 NOTE — Progress Notes (Signed)
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  Past medical history significant for COPD, GERD and obstructive sleep apnea.  CT angio chest abdomen pelvis showed 6.4 x 3.7 cm right paratracheal adenopathy concerning for malignancy.  1.9 cm right hilar node.  12 mm anterior mediastinal lymph node.  2.8 cm right thyroid nodule.  Several pleural-based masses concerning for metastatic disease.  Large irregular opacity in the right upper lobe which may be malignancy versus postobstructive atelectasis.  Lytic lesion noted in the manubrium consistent with metastatic disease.  Moderate right-sided pleural effusion s/p thoracentesis.    Cytology showedPoorly differentiated carcinoma with neuroendocrine differentiation.  Tumor cells were positive for pancytokeratin and CD56 but negative for TTF-1 p40 and synaptophysin.  Chromogranin stain was also negative.   MRI brain was negative for metastatic disease  Patient received 4 cycles of carboplatin etoposide and Tecentriq until May 2022 and following that he has been on maintenance Tecentriq    Interval history-patient reports having low back pain which radiates towards his sternum.  He reports pain throughout his body in general but more towards his bilateral shoulders.  He reports ongoing fatigue he has been using morphine Tylenol  and Aleve to help with his pain but states that pain still lasts for about 4 to 5 hours before going down a little bit.  He had presented with similar complaints back in February 2022 when he had presented to the [facility] then he had troponin and EKG done which was otherwise unremarkable  ECOG PS- 2 Pain scale- 4   Review of systems- Review of Systems  Constitutional:  Positive for malaise/fatigue. Negative for chills, fever and weight loss.  HENT:  Negative for congestion, ear discharge and nosebleeds.   Eyes:  Negative for blurred vision.  Respiratory:  Negative for cough, hemoptysis, sputum production, shortness of breath and wheezing.   Cardiovascular:  Positive for chest pain. Negative for palpitations, orthopnea and claudication.  Gastrointestinal:  Negative for abdominal pain, blood in stool, constipation, diarrhea, heartburn, melena, nausea and vomiting.  Genitourinary:  Negative for dysuria, flank pain, frequency, hematuria and urgency.  Musculoskeletal:  Positive for back pain. Negative for joint pain and myalgias.       Generalized body aches  Skin:  Negative for rash.  Neurological:  Negative for dizziness, tingling, focal weakness, seizures, weakness and headaches.  Endo/Heme/Allergies:  Does not bruise/bleed easily.  Psychiatric/Behavioral:  Negative for depression and suicidal ideas. The patient does not have insomnia.       Allergies  Allergen Reactions   Cephalexin Rash     Past Medical History:  Diagnosis Date   Adenomatous polyps    Asthma    Barrett's esophagus    Colon polyp    COPD (chronic obstructive pulmonary disease) ([place])    uses inhaler, "i may have COPD"   Dyspnea    Eye injury    GERD (gastroesophageal reflux disease)  History of gonorrhea    History of pneumonia    Lung cancer ([place])    Obesity    Pneumonia    Reflux esophagitis    Sleep apnea      Past Surgical History:  Procedure Laterality Date   CATARACT EXTRACTION Left     COLONOSCOPY     COLONOSCOPY WITH PROPOFOL N/A 05/19/2015   Procedure: COLONOSCOPY WITH PROPOFOL;  Surgeon: [name], MD;  Location: [facility] ENDOSCOPY;  Service: Endoscopy;  Laterality: N/A;   COLONOSCOPY WITH PROPOFOL N/A 09/11/2018   Procedure: COLONOSCOPY WITH PROPOFOL;  Surgeon: [name], MD;  Location: [facility] ENDOSCOPY;  Service: Endoscopy;  Laterality: N/A;   ESOPHAGOGASTRODUODENOSCOPY (EGD) WITH PROPOFOL N/A 05/19/2015   Procedure: ESOPHAGOGASTRODUODENOSCOPY (EGD) WITH PROPOFOL;  Surgeon: [name], MD;  Location: [facility] ENDOSCOPY;  Service: Endoscopy;  Laterality: N/A;   ESOPHAGOGASTRODUODENOSCOPY (EGD) WITH PROPOFOL N/A 10/11/2016   Procedure: ESOPHAGOGASTRODUODENOSCOPY (EGD) WITH PROPOFOL;  Surgeon: [name], MD;  Location: [facility] ENDOSCOPY;  Service: Endoscopy;  Laterality: N/A;   ESOPHAGOGASTRODUODENOSCOPY (EGD) WITH PROPOFOL N/A 01/01/2018   Procedure: ESOPHAGOGASTRODUODENOSCOPY (EGD) WITH PROPOFOL;  Surgeon: [name], MD;  Location: [facility] ENDOSCOPY;  Service: Endoscopy;  Laterality: N/A;   ESOPHAGOGASTRODUODENOSCOPY (EGD) WITH PROPOFOL N/A 09/11/2018   Procedure: ESOPHAGOGASTRODUODENOSCOPY (EGD) WITH PROPOFOL;  Surgeon: [name], MD;  Location: [facility] ENDOSCOPY;  Service: Endoscopy;  Laterality: N/A;   EYE SURGERY     IR IMAGING GUIDED PORT INSERTION  04/21/2020    Social History   Socioeconomic History   Marital status: Married    Spouse name: Not on file   Number of children: Not on file   Years of education: Not on file   Highest education level: Not on file  Occupational History   Not on file  Tobacco Use   Smoking status: Every Day    Packs/day: 1.50    Years: 35.00    Pack years: 52.50    Types: Cigarettes, Cigars    Last attempt to quit: 03/04/2015    Years since quitting: 5.5   Smokeless tobacco: Never  Vaping Use   Vaping Use: Former  Substance and Sexual Activity   Alcohol use: Not Currently    Comment: none or 3 weeks     Drug use: No   Sexual activity: Not on file  Other Topics Concern   Not on file  Social History Narrative   Not on file   Social Determinants of Health   Financial Resource Strain: Not on file  Food Insecurity: Not on file  Transportation Needs: Not on file  Physical Activity: Not on file  Stress: Not on file  Social Connections: Not on file  Intimate Partner Violence: Not on file    Family History  Problem Relation Age of Onset   Emphysema Mother      Current Outpatient Medications:    levothyroxine (SYNTHROID) 150 MCG tablet, Take 1 tablet (150 mcg total) by mouth daily before breakfast., Disp: 30 tablet, Rfl: 2   lidocaine-prilocaine (EMLA) cream, Apply to affected area once, Disp: 30 g, Rfl: 3   LORazepam (ATIVAN) 0.5 MG tablet, Take 1 tablet (0.5 mg total) by mouth 3 (three) times daily as needed (Nausea or vomiting)., Disp: 30 tablet, Rfl: 0   ondansetron (ZOFRAN) 8 MG tablet, Take 1 tablet (8 mg total) by mouth 2 (two) times daily as needed for refractory nausea / vomiting. Start on day 3 after carboplatin chemo., Disp: 30 tablet, Rfl: 1   albuterol (  PROVENTIL HFA;VENTOLIN HFA) 108 (90 Base) MCG/ACT inhaler, 2 puffs q.i.d. p.r.n. short of breath, wheezing, or cough (Patient not taking: No sig reported), Disp: , Rfl:    fentaNYL (DURAGESIC) 12 MCG/HR, Place 1 patch onto the skin every 3 (three) days., Disp: 5 patch, Rfl: 0   Morphine Sulfate, Concentrate, 5 MG/0.25ML SOLN, Take 5 mg by mouth every 4 (four) hours as needed. For dyspnea, Disp: 30 mL, Rfl: 0   prochlorperazine (COMPAZINE) 10 MG tablet, Take 1 tablet (10 mg total) by mouth every 6 (six) hours as needed (Nausea or vomiting). (Patient not taking: Reported on 09/18/2020), Disp: 30 tablet, Rfl: 1 No current facility-administered medications for this visit.  Facility-Administered Medications Ordered in Other Visits:    atezolizumab (TECENTRIQ) 1,200 mg in sodium chloride 0.9 % 250 mL chemo infusion, 1,200 mg,  Intravenous, Once, Sindy Guadeloupe, MD, Last Rate: 540 mL/hr at 09/18/20 1115, 1,200 mg at 09/18/20 1115   heparin lock flush 100 unit/mL, 500 Units, Intracatheter, Once PRN, Sindy Guadeloupe, MD  Physical exam:  Vitals:   09/18/20 0925  BP: 107/63  Pulse: 79  Resp: 18  Temp: 97.7 F (36.5 C)  SpO2: 97%  Weight: (!) 323 lb 14.4 oz (146.9 kg)   Physical Exam Constitutional:      General: He is not in acute distress.    Comments: Appears fatigued  Cardiovascular:     Rate and Rhythm: Normal rate and regular rhythm.     Heart sounds: Normal heart sounds.  Pulmonary:     Effort: Pulmonary effort is normal.     Breath sounds: Normal breath sounds.  Abdominal:     General: Bowel sounds are normal.     Palpations: Abdomen is soft.  Skin:    General: Skin is warm and dry.  Neurological:     Mental Status: He is alert and oriented to person, place, and time.     CMP Latest Ref Rng & Units 09/18/2020  Glucose 70 - 99 mg/dL 109(H)  BUN 8 - 23 mg/dL 14  Creatinine 0.61 - 1.24 mg/dL 0.75  Sodium 135 - 145 mmol/L 136  Potassium 3.5 - 5.1 mmol/L 3.6  Chloride 98 - 111 mmol/L 103  CO2 22 - 32 mmol/L 25  Calcium 8.9 - 10.3 mg/dL 9.0  Total Protein 6.5 - 8.1 g/dL 7.1  Total Bilirubin 0.3 - 1.2 mg/dL 0.5  Alkaline Phos 38 - 126 U/L 73  AST 15 - 41 U/L 30  ALT 0 - 44 U/L 26   CBC Latest Ref Rng & Units 09/18/2020  WBC 4.0 - 10.5 K/uL 10.0  Hemoglobin 13.0 - 17.0 g/dL 15.7  Hematocrit 39.0 - 52.0 % 47.4  Platelets 150 - 400 K/uL 223    Assessment and plan- Patient is a 75 y.o. male with extensive stage small cell lung cancer s/p 4 cycles of carbo etoposide Tecentriq chemotherapy here for on treatment assessment prior to cycle 4 of maintenance Tecentriq  Counts okay to proceed with cycle 4 of maintenance Tecentriq today and I will see him back in 3 weeks for cycle 5.  He will need CT chest abdomen and pelvis with contrast and a bone scan prior.  Patient reports low back pain which  sometimes radiates to his sternum and bilateral shoulder pain.  Symptoms are somewhat atypical for cardiac etiology.  He has had similar complaints in February when he presented to the ER as well and his cardiac work-up was negative.  I will proceed with  an echocardiogram at this time.  I will treat his pain as a neoplasm related pain since he does have sternal mets as well as diffuse bone metastases.  He is currently on morphine as needed which I will renew and start him on a fentanyl patch 12 mcg.  I will have him follow-up with NP Altha Harm in 10 days time to further titrate his pain regimen if need be.  He will receive Zometa with next cycle  Autoimmune hypothyroidism: TSH remains elevated at 46. I will increase his levothyroxine to 150 mcg daily.     Visit Diagnosis 1. Small cell lung cancer, right ([place])   2. Atypical chest pain   3. Autoimmune hypothyroidism   4. Encounter for antineoplastic immunotherapy   5. Neoplasm related pain      Dr. Randa Evens, MD, MPH [facility] at [facility] [phone] 09/18/2020 11:44 AM

## 2020-09-18 NOTE — Patient Instructions (Addendum)
[facility] ONCOLOGY  Discharge Instructions: Thank you for choosing [facility] to provide your oncology and hematology care.  If you have a lab appointment with the [facility], please go directly to the [facility] and check in at the registration area.  Wear comfortable clothing and clothing appropriate for easy access to any Portacath or PICC line.   We strive to give you quality time with your provider. You may need to reschedule your appointment if you arrive late (15 or more minutes).  Arriving late affects you and other patients whose appointments are after yours.  Also, if you miss three or more appointments without notifying the office, you may be dismissed from the clinic at the provider's discretion.      For prescription refill requests, have your pharmacy contact our office and allow 72 hours for refills to be completed.    Today you received the following chemotherapy and/or immunotherapy agents: Tecentriq    To help prevent nausea and vomiting after your treatment, we encourage you to take your nausea medication as directed.  BELOW ARE SYMPTOMS THAT SHOULD BE REPORTED IMMEDIATELY: *FEVER GREATER THAN 100.4 F (38 C) OR HIGHER *CHILLS OR SWEATING *NAUSEA AND VOMITING THAT IS NOT CONTROLLED WITH YOUR NAUSEA MEDICATION *UNUSUAL SHORTNESS OF BREATH *UNUSUAL BRUISING OR BLEEDING *URINARY PROBLEMS (pain or burning when urinating, or frequent urination) *BOWEL PROBLEMS (unusual diarrhea, constipation, pain near the anus) TENDERNESS IN MOUTH AND THROAT WITH OR WITHOUT PRESENCE OF ULCERS (sore throat, sores in mouth, or a toothache) UNUSUAL RASH, SWELLING OR PAIN  UNUSUAL VAGINAL DISCHARGE OR ITCHING   Items with * indicate a potential emergency and should be followed up as soon as possible or go to the Emergency Department if any problems should occur.  Please show the CHEMOTHERAPY ALERT CARD or IMMUNOTHERAPY ALERT CARD at check-in to  the Emergency Department and triage nurse.  Should you have questions after your visit or need to cancel or reschedule your appointment, please contact [facility]  [phone] and follow the prompts.  Office hours are 8:00 a.m. to 4:30 p.m. Monday - Friday. Please note that voicemails left after 4:00 p.m. may not be returned until the following business day.  We are closed weekends and major holidays. You have access to a nurse at all times for urgent questions. Please call the main number to the clinic [phone] and follow the prompts.  For any non-urgent questions, you may also contact your provider using MyChart. We now offer e-Visits for anyone 61 and older to request care online for non-urgent symptoms. For details visit [email or web address].   Also download the MyChart app! Go to the app store, search "MyChart", open the app, select [facility], and log in with your MyChart username and password.  Due to Covid, a mask is required upon entering the hospital/clinic. If you do not have a mask, one will be given to you upon arrival. For doctor visits, patients may have 1 support person aged 47 or older with them. For treatment visits, patients cannot have anyone with them due to current Covid guidelines and our immunocompromised population.

## 2020-09-18 NOTE — Progress Notes (Signed)
Virtual Visit via Video Note  I connected with David Johnson on 09/18/20 at  1:00 PM EDT by a video enabled telemedicine application and verified that I am speaking with the correct person using two identifiers.  Location: Patient: Home Provider: Clinic   I discussed the limitations of evaluation and management by telemedicine and the availability of in person appointments. The patient expressed understanding and agreed to proceed.  History of Present Illness: David Johnson is a 75 y.o. 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  PMH is also notable for COPD and OSA requiring CPAP.  Palliative care was consulted help address goals manage ongoing symptoms.   Observations/Objective: Patient saw Dr. Janese Banks this morning.  He was started on transdermal fentanyl for pain.  Patient had several questions regarding the fentanyl, which we discussed extensively.  He is also taking morphine as needed but has not found it to be especially helpful.  Patient also uses THC on occasion but does not like the side effect of frequent dj vu.  Patient describes chronic weakness that has worsened recently.  He still functionally independent but having more difficulty ambulating at times.  He recently required use of two canes.  We will refer him to [facility] for rehab screening.  Assessment and Plan: Neoplasm related pain -agree with transdermal fentanyl.  Continue morphine as needed for breakthrough pain.  Continue daily bowel regimen to prevent opioid-induced constipation.  Weakness -referral to [facility]  Follow Up Instructions: Follow-up 2 weeks   I discussed the assessment and treatment plan with the patient. The patient was provided an opportunity to ask questions and all were answered. The patient agreed with the plan and demonstrated an understanding of the instructions.   The patient was advised to  call back or seek an in-person evaluation if the symptoms worsen or if the condition fails to improve as anticipated.  I provided 15 minutes of non-face-to-face time during this encounter.   Irean Hong, NP

## 2020-09-18 NOTE — Progress Notes (Signed)
concerned about running out of morphine not helping much, but takes the pain away. Also dealing with night sweats for the past two weeks. Feeling somewhat depressed, states "he feels like his quality of life took a step back due to all the pain he is experiencing."

## 2020-09-25 ENCOUNTER — Encounter: Payer: Self-pay | Admitting: Oncology

## 2020-09-25 ENCOUNTER — Inpatient Hospital Stay: Payer: Managed Care, Other (non HMO) | Attending: Oncology

## 2020-09-25 ENCOUNTER — Inpatient Hospital Stay: Payer: Managed Care, Other (non HMO) | Admitting: Nurse Practitioner

## 2020-09-25 ENCOUNTER — Inpatient Hospital Stay (HOSPITAL_BASED_OUTPATIENT_CLINIC_OR_DEPARTMENT_OTHER): Payer: Managed Care, Other (non HMO) | Admitting: Oncology

## 2020-09-25 VITALS — BP 129/68 | HR 56 | Temp 97.6°F | Resp 18 | Wt 326.4 lb

## 2020-09-25 DIAGNOSIS — R6889 Other general symptoms and signs: Secondary | ICD-10-CM | POA: Diagnosis not present

## 2020-09-25 DIAGNOSIS — E039 Hypothyroidism, unspecified: Secondary | ICD-10-CM | POA: Insufficient documentation

## 2020-09-25 DIAGNOSIS — C7951 Secondary malignant neoplasm of bone: Secondary | ICD-10-CM | POA: Diagnosis present

## 2020-09-25 DIAGNOSIS — G893 Neoplasm related pain (acute) (chronic): Secondary | ICD-10-CM | POA: Diagnosis not present

## 2020-09-25 DIAGNOSIS — C3491 Malignant neoplasm of unspecified part of right bronchus or lung: Secondary | ICD-10-CM

## 2020-09-25 DIAGNOSIS — C3411 Malignant neoplasm of upper lobe, right bronchus or lung: Secondary | ICD-10-CM | POA: Diagnosis present

## 2020-09-25 DIAGNOSIS — F17210 Nicotine dependence, cigarettes, uncomplicated: Secondary | ICD-10-CM | POA: Insufficient documentation

## 2020-09-25 DIAGNOSIS — Z5112 Encounter for antineoplastic immunotherapy: Secondary | ICD-10-CM | POA: Diagnosis present

## 2020-09-25 DIAGNOSIS — K5903 Drug induced constipation: Secondary | ICD-10-CM | POA: Insufficient documentation

## 2020-09-25 DIAGNOSIS — Z79899 Other long term (current) drug therapy: Secondary | ICD-10-CM | POA: Insufficient documentation

## 2020-09-25 DIAGNOSIS — Z79891 Long term (current) use of opiate analgesic: Secondary | ICD-10-CM | POA: Diagnosis not present

## 2020-09-25 DIAGNOSIS — R932 Abnormal findings on diagnostic imaging of liver and biliary tract: Secondary | ICD-10-CM | POA: Diagnosis not present

## 2020-09-25 LAB — CBC WITH DIFFERENTIAL/PLATELET
Abs Immature Granulocytes: 0.46 10*3/uL — ABNORMAL HIGH (ref 0.00–0.07)
Basophils Absolute: 0.1 10*3/uL (ref 0.0–0.1)
Basophils Relative: 1 %
Eosinophils Absolute: 0.1 10*3/uL (ref 0.0–0.5)
Eosinophils Relative: 1 %
HCT: 46 % (ref 39.0–52.0)
Hemoglobin: 15.2 g/dL (ref 13.0–17.0)
Immature Granulocytes: 5 %
Lymphocytes Relative: 19 %
Lymphs Abs: 1.7 10*3/uL (ref 0.7–4.0)
MCH: 31.8 pg (ref 26.0–34.0)
MCHC: 33 g/dL (ref 30.0–36.0)
MCV: 96.2 fL (ref 80.0–100.0)
Monocytes Absolute: 0.6 10*3/uL (ref 0.1–1.0)
Monocytes Relative: 7 %
Neutro Abs: 5.9 10*3/uL (ref 1.7–7.7)
Neutrophils Relative %: 67 %
Platelets: 202 10*3/uL (ref 150–400)
RBC: 4.78 MIL/uL (ref 4.22–5.81)
RDW: 13.9 % (ref 11.5–15.5)
WBC: 8.8 10*3/uL (ref 4.0–10.5)
nRBC: 0 % (ref 0.0–0.2)

## 2020-09-25 LAB — COMPREHENSIVE METABOLIC PANEL
ALT: 26 U/L (ref 0–44)
AST: 29 U/L (ref 15–41)
Albumin: 3.1 g/dL — ABNORMAL LOW (ref 3.5–5.0)
Alkaline Phosphatase: 93 U/L (ref 38–126)
Anion gap: 12 (ref 5–15)
BUN: 16 mg/dL (ref 8–23)
CO2: 23 mmol/L (ref 22–32)
Calcium: 9.3 mg/dL (ref 8.9–10.3)
Chloride: 103 mmol/L (ref 98–111)
Creatinine, Ser: 0.7 mg/dL (ref 0.61–1.24)
GFR, Estimated: >60 mL/min (ref >60)
Glucose, Bld: 135 mg/dL — ABNORMAL HIGH (ref 70–99)
Potassium: 3.9 mmol/L (ref 3.5–5.1)
Sodium: 138 mmol/L (ref 135–145)
Total Bilirubin: 0.4 mg/dL (ref 0.3–1.2)
Total Protein: 6.9 g/dL (ref 6.5–8.1)

## 2020-09-25 LAB — CORTISOL: Cortisol, Plasma: 15.5 ug/dL

## 2020-09-25 NOTE — Progress Notes (Signed)
Patient reports having occasional sweat episodes.  He did place his first Fentanyl patch yesterday and feels that his pain has improved.

## 2020-09-25 NOTE — Progress Notes (Signed)
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  Past medical history significant for COPD, GERD and obstructive sleep apnea.  CT angio chest abdomen pelvis showed 6.4 x 3.7 cm right paratracheal adenopathy concerning for malignancy.  1.9 cm right hilar node.  12 mm anterior mediastinal lymph node.  2.8 cm right thyroid nodule.  Several pleural-based masses concerning for metastatic disease.  Large irregular opacity in the right upper lobe which may be malignancy versus postobstructive atelectasis.  Lytic lesion noted in the manubrium consistent with metastatic disease.  Moderate right-sided pleural effusion s/p thoracentesis.    Cytology showedPoorly differentiated carcinoma with neuroendocrine differentiation.  Tumor cells were positive for pancytokeratin and CD56 but negative for TTF-1 p40 and synaptophysin.  Chromogranin stain was also negative.   MRI brain was negative for metastatic disease  Patient received 4 cycles of carboplatin etoposide and Tecentriq until May 2022 and following that he has been on maintenance Tecentriq   Interval history-David Johnson presents today for follow-up.  He last received maintenance Tecentriq 1 week ago on 09/18/2020.  He complained of persistent low back pain that radiated towards his sternum and bilateral shoulders.  He was started on a 12 mcg fentanyl patch which he placed yesterday around 1  PM.  States today, he feels much better and his pain has had significant relief since starting the fentanyl patch. Reports new pain of left elbow but feels it is related to how he slept last night.  Has occasional right-sided muscle spasms.  He is able to lift his right lower extremity higher than he has and months.  He is walking easier and feels more confident going up and down his stairs. Reports light sensitivity to his right eye that has worsened over the past 2 weeks.  Has history of left retinal surgery and has concerns about this progressively worsening.  He continues to work full-time and if he has to decrease his hours he may lose his health insurance.  ECOG PS- 2   Review of systems- Review of Systems  Constitutional:  Positive for malaise/fatigue. Negative for chills, fever and weight loss.  HENT:  Negative for congestion, ear pain and tinnitus.        Light sensitivity  Eyes: Negative.  Negative for blurred vision and double vision.  Respiratory: Negative.  Negative for cough, sputum production and shortness of breath.   Cardiovascular: Negative.  Negative for chest pain, palpitations and leg swelling.  Gastrointestinal: Negative.  Negative for abdominal pain, constipation, diarrhea, nausea and vomiting.  Genitourinary:  Negative for dysuria, frequency and urgency.  Musculoskeletal:  Positive for back pain (improved) and joint pain (left elbow). Negative for falls.  Skin: Negative.  Negative for rash.  Neurological: Negative.  Negative for weakness and headaches.  Endo/Heme/Allergies: Negative.  Does not bruise/bleed easily.  Psychiatric/Behavioral: Negative.  Negative for depression. The patient is not nervous/anxious and does not have insomnia.       Allergies  Allergen Reactions   Cephalexin Rash     Past Medical History:  Diagnosis Date   Adenomatous polyps    Asthma    Barrett's esophagus    Colon polyp    COPD (chronic obstructive pulmonary disease) ([place])    uses  inhaler, "i may have COPD"   Dyspnea    Eye injury    GERD (gastroesophageal reflux disease)    History of gonorrhea    History of pneumonia    Lung cancer ([place])    Obesity    Pneumonia    Reflux esophagitis    Sleep apnea      Past Surgical History:  Procedure Laterality Date   CATARACT EXTRACTION Left    COLONOSCOPY     COLONOSCOPY WITH PROPOFOL N/A 05/19/2015   Procedure: COLONOSCOPY WITH PROPOFOL;  Surgeon: [name], MD;  Location: [facility] ENDOSCOPY;  Service: Endoscopy;  Laterality: N/A;   COLONOSCOPY WITH PROPOFOL N/A 09/11/2018   Procedure: COLONOSCOPY WITH PROPOFOL;  Surgeon: [name], MD;  Location: [facility] ENDOSCOPY;  Service: Endoscopy;  Laterality: N/A;   ESOPHAGOGASTRODUODENOSCOPY (EGD) WITH PROPOFOL N/A 05/19/2015   Procedure: ESOPHAGOGASTRODUODENOSCOPY (EGD) WITH PROPOFOL;  Surgeon: [name], MD;  Location: [facility] ENDOSCOPY;  Service: Endoscopy;  Laterality: N/A;   ESOPHAGOGASTRODUODENOSCOPY (EGD) WITH PROPOFOL N/A 10/11/2016   Procedure: ESOPHAGOGASTRODUODENOSCOPY (EGD) WITH PROPOFOL;  Surgeon: [name], MD;  Location: [facility] ENDOSCOPY;  Service: Endoscopy;  Laterality: N/A;   ESOPHAGOGASTRODUODENOSCOPY (EGD) WITH PROPOFOL N/A 01/01/2018   Procedure: ESOPHAGOGASTRODUODENOSCOPY (EGD) WITH PROPOFOL;  Surgeon: [name], MD;  Location: [facility] ENDOSCOPY;  Service: Endoscopy;  Laterality: N/A;   ESOPHAGOGASTRODUODENOSCOPY (EGD) WITH PROPOFOL N/A 09/11/2018   Procedure: ESOPHAGOGASTRODUODENOSCOPY (EGD) WITH PROPOFOL;  Surgeon: [name], MD;  Location: [facility] ENDOSCOPY;  Service: Endoscopy;  Laterality: N/A;   EYE SURGERY     IR IMAGING GUIDED PORT INSERTION  04/21/2020    Social History   Socioeconomic History   Marital status: Married    Spouse name: Not on file   Number of children: Not on file   Years of education: Not on file   Highest education level: Not on file  Occupational History   Not on file  Tobacco Use   Smoking  status: Every Day    Packs/day: 1.50    Years: 35.00    Pack years: 52.50    Types: Cigarettes, Cigars    Last attempt to quit: 03/04/2015    Years since quitting: 5.5   Smokeless tobacco: Never  Vaping Use   Vaping Use: Former  Substance and Sexual Activity   Alcohol use: Not Currently    Comment: none or 3 weeks    Drug use: No   Sexual activity: Not on file  Other Topics Concern   Not on file  Social History Narrative   Not on file   Social Determinants of Health   Financial Resource Strain: Not on file  Food Insecurity: Not on file  Transportation Needs: Not on file  Physical Activity: Not on file  Stress: Not on file  Social Connections: Not on file  Intimate Partner Violence: Not on file    Family History  Problem Relation Age of Onset   Emphysema Mother      Current Outpatient Medications:    fentaNYL (DURAGESIC) 12 MCG/HR, Place 1 patch onto the skin every 3 (three) days., Disp: 5 patch, Rfl: 0   levothyroxine (SYNTHROID) 150 MCG tablet, Take 1 tablet (150 mcg total) by mouth daily before  breakfast., Disp: 30 tablet, Rfl: 2   lidocaine-prilocaine (EMLA) cream, Apply to affected area once, Disp: 30 g, Rfl: 3   LORazepam (ATIVAN) 0.5 MG tablet, Take 1 tablet (0.5 mg total) by mouth 3 (three) times daily as needed (Nausea or vomiting)., Disp: 30 tablet, Rfl: 0   Morphine Sulfate, Concentrate, 5 MG/0.25ML SOLN, Take 5 mg by mouth every 4 (four) hours as needed. For dyspnea, Disp: 30 mL, Rfl: 0   ondansetron (ZOFRAN) 8 MG tablet, Take 1 tablet (8 mg total) by mouth 2 (two) times daily as needed for refractory nausea / vomiting. Start on day 3 after carboplatin chemo., Disp: 30 tablet, Rfl: 1   albuterol (PROVENTIL HFA;VENTOLIN HFA) 108 (90 Base) MCG/ACT inhaler, 2 puffs q.i.d. p.r.n. short of breath, wheezing, or cough (Patient not taking: No sig reported), Disp: , Rfl:    levothyroxine (SYNTHROID) 150 MCG tablet, Take 1 tablet (150 mcg total) by mouth daily before  breakfast. (Patient not taking: Reported on 09/25/2020), Disp: 30 tablet, Rfl: 2   prochlorperazine (COMPAZINE) 10 MG tablet, Take 1 tablet (10 mg total) by mouth every 6 (six) hours as needed (Nausea or vomiting). (Patient not taking: No sig reported), Disp: 30 tablet, Rfl: 1  Physical exam:  Vitals:   09/25/20 0937  BP: 129/68  Pulse: (!) 56  Resp: 18  Temp: 97.6 F (36.4 C)  SpO2: 96%  Weight: (!) 326 lb 6.4 oz (148.1 kg)   Physical Exam Constitutional:      Appearance: Normal appearance. He is obese.  HENT:     Head: Normocephalic and atraumatic.  Eyes:     Pupils: Pupils are equal, round, and reactive to light.  Cardiovascular:     Rate and Rhythm: Normal rate and regular rhythm.     Heart sounds: Normal heart sounds. No murmur heard. Pulmonary:     Effort: Pulmonary effort is normal.     Breath sounds: Normal breath sounds. No wheezing.  Abdominal:     General: Bowel sounds are normal. There is no distension.     Palpations: Abdomen is soft.     Tenderness: There is no abdominal tenderness.  Musculoskeletal:        General: Normal range of motion.     Cervical back: Normal range of motion.  Skin:    General: Skin is warm and dry.     Findings: No rash.  Neurological:     Mental Status: He is alert and oriented to person, place, and time.  Psychiatric:        Judgment: Judgment normal.     CMP Latest Ref Rng & Units 09/25/2020  Glucose 70 - 99 mg/dL 135(H)  BUN 8 - 23 mg/dL 16  Creatinine 0.61 - 1.24 mg/dL 0.70  Sodium 135 - 145 mmol/L 138  Potassium 3.5 - 5.1 mmol/L 3.9  Chloride 98 - 111 mmol/L 103  CO2 22 - 32 mmol/L 23  Calcium 8.9 - 10.3 mg/dL 9.3  Total Protein 6.5 - 8.1 g/dL 6.9  Total Bilirubin 0.3 - 1.2 mg/dL 0.4  Alkaline Phos 38 - 126 U/L 93  AST 15 - 41 U/L 29  ALT 0 - 44 U/L 26   CBC Latest Ref Rng & Units 09/25/2020  WBC 4.0 - 10.5 K/uL 8.8  Hemoglobin 13.0 - 17.0 g/dL 15.2  Hematocrit 39.0 - 52.0 % 46.0  Platelets 150 - 400 K/uL 202     Assessment and plan- Patient is a 75 y.o. male with extensive stage small cell  lung cancer s/p 4 cycles of carbo etoposide and Tecentriq who is currently on maintenance Tecentriq (09/18/20) who presents for pain follow-up. He is scheduled for imaging to assess treatment response on 10/02/2020 followed by an echo on 10/04/2020 and continuation of treatment on 10/09/2020.  Was started on 12 mcg fentanyl patch last week but actually applied it yesterday at 1 PM.  Has had tremendous relief of his pain since application.  He is not using his morphine.  Has been using Tylenol and Aleve sparingly.  Has acute left elbow pain likely secondary to how he slept last night.  We will continue to monitor this.  Right eye sensitivity-patient has questions regarding SE from Tecentriq.  Appears to be worsening over the past 2 weeks.  If it is persistent, would recommend follow-up with ophthalmologist given history of left retina surgeries in the past.  Per up-to-date, less than 1% of patients can experience uveitis or iritis.  He denies any pain just light sensitivity while looking at the computer screen.  David Johnson will return to clinic as scheduled.  No additional appointments needed for today.  I spent 20 minutes dedicated to the care of this patient (face-to-face and non-face-to-face) on the date of the encounter to include what is described in the assessment and plan.  Visit Diagnosis 1. Small cell lung cancer, right ([place])   2. Neoplasm related pain   3. Light sensitivity     Faythe Casa, NP 09/25/2020 12:06 PM

## 2020-09-26 LAB — ACTH: C206 ACTH: 9.6 pg/mL (ref 7.2–63.3)

## 2020-10-02 ENCOUNTER — Ambulatory Visit
Admission: RE | Admit: 2020-10-02 | Discharge: 2020-10-02 | Disposition: A | Discharge status: Home / Self Care | Payer: Managed Care, Other (non HMO) | Source: Ambulatory Visit | Attending: Oncology | Admitting: Oncology

## 2020-10-02 ENCOUNTER — Other Ambulatory Visit: Payer: Self-pay

## 2020-10-02 DIAGNOSIS — C3491 Malignant neoplasm of unspecified part of right bronchus or lung: Secondary | ICD-10-CM | POA: Insufficient documentation

## 2020-10-02 DIAGNOSIS — Z5112 Encounter for antineoplastic immunotherapy: Secondary | ICD-10-CM | POA: Insufficient documentation

## 2020-10-02 MED ORDER — TECHNETIUM TC 99M MEDRONATE IV KIT
20.0000 | PACK | Freq: Once | INTRAVENOUS | Status: AC | PRN
  Administered 2020-10-02: 20.53 via INTRAVENOUS

## 2020-10-02 MED ORDER — IOHEXOL 350 MG/ML SOLN
100.0000 mL | Freq: Once | INTRAVENOUS | Status: AC | PRN
  Administered 2020-10-02: 100 mL via INTRAVENOUS

## 2020-10-03 ENCOUNTER — Other Ambulatory Visit: Payer: Self-pay

## 2020-10-03 ENCOUNTER — Telehealth: Payer: Self-pay | Admitting: *Deleted

## 2020-10-03 NOTE — Telephone Encounter (Signed)
Radiology called to report results of recent CT of Abd/pelvis and chest. Results are in the chart.

## 2020-10-04 ENCOUNTER — Other Ambulatory Visit: Payer: Self-pay

## 2020-10-04 ENCOUNTER — Ambulatory Visit
Admission: RE | Admit: 2020-10-04 | Discharge: 2020-10-04 | Disposition: A | Discharge status: Home / Self Care | Payer: Managed Care, Other (non HMO) | Source: Ambulatory Visit | Attending: Oncology | Admitting: Oncology

## 2020-10-04 ENCOUNTER — Encounter: Payer: Self-pay | Admitting: Oncology

## 2020-10-04 ENCOUNTER — Encounter: Payer: Self-pay | Admitting: Nurse Practitioner

## 2020-10-04 DIAGNOSIS — J45909 Unspecified asthma, uncomplicated: Secondary | ICD-10-CM | POA: Diagnosis not present

## 2020-10-04 DIAGNOSIS — R0789 Other chest pain: Secondary | ICD-10-CM | POA: Insufficient documentation

## 2020-10-04 DIAGNOSIS — G4730 Sleep apnea, unspecified: Secondary | ICD-10-CM | POA: Insufficient documentation

## 2020-10-04 DIAGNOSIS — J449 Chronic obstructive pulmonary disease, unspecified: Secondary | ICD-10-CM | POA: Insufficient documentation

## 2020-10-04 LAB — ECHOCARDIOGRAM COMPLETE
Area-P 1/2: 2.52 cm2
S' Lateral: 4 cm

## 2020-10-04 MED ORDER — FENTANYL 25 MCG/HR TD PT72: 1.0000 | MEDICATED_PATCH | TRANSDERMAL | 0 refills | Status: DC

## 2020-10-04 MED ORDER — PERFLUTREN LIPID MICROSPHERE
1.0000 mL | INTRAVENOUS | Status: AC | PRN
  Administered 2020-10-04: 4 mL via INTRAVENOUS
  Filled 2020-10-04: qty 10

## 2020-10-04 NOTE — Progress Notes (Signed)
*  PRELIMINARY RESULTS* Echocardiogram 2D Echocardiogram has been performed.  David Johnson 10/04/2020, 11:40 AM

## 2020-10-05 ENCOUNTER — Encounter: Payer: Self-pay | Admitting: Nurse Practitioner

## 2020-10-05 ENCOUNTER — Encounter: Payer: Self-pay | Admitting: Oncology

## 2020-10-09 ENCOUNTER — Inpatient Hospital Stay (HOSPITAL_BASED_OUTPATIENT_CLINIC_OR_DEPARTMENT_OTHER): Payer: Managed Care, Other (non HMO) | Admitting: Oncology

## 2020-10-09 ENCOUNTER — Other Ambulatory Visit: Payer: Self-pay | Admitting: *Deleted

## 2020-10-09 ENCOUNTER — Inpatient Hospital Stay: Payer: Managed Care, Other (non HMO) | Admitting: Hospice and Palliative Medicine

## 2020-10-09 ENCOUNTER — Encounter: Payer: Self-pay | Admitting: *Deleted

## 2020-10-09 ENCOUNTER — Encounter: Payer: Self-pay | Admitting: Oncology

## 2020-10-09 ENCOUNTER — Inpatient Hospital Stay: Payer: Managed Care, Other (non HMO)

## 2020-10-09 VITALS — BP 130/73 | HR 91 | Temp 97.0°F | Resp 20 | Wt 322.5 lb

## 2020-10-09 DIAGNOSIS — E063 Autoimmune thyroiditis: Secondary | ICD-10-CM

## 2020-10-09 DIAGNOSIS — C3491 Malignant neoplasm of unspecified part of right bronchus or lung: Secondary | ICD-10-CM | POA: Diagnosis not present

## 2020-10-09 DIAGNOSIS — G893 Neoplasm related pain (acute) (chronic): Secondary | ICD-10-CM | POA: Diagnosis not present

## 2020-10-09 DIAGNOSIS — D751 Secondary polycythemia: Secondary | ICD-10-CM

## 2020-10-09 DIAGNOSIS — Z5112 Encounter for antineoplastic immunotherapy: Secondary | ICD-10-CM | POA: Diagnosis not present

## 2020-10-09 LAB — CBC WITH DIFFERENTIAL/PLATELET
Abs Immature Granulocytes: 0.76 10*3/uL — ABNORMAL HIGH (ref 0.00–0.07)
Basophils Absolute: 0 10*3/uL (ref 0.0–0.1)
Basophils Relative: 0 %
Eosinophils Absolute: 0 10*3/uL (ref 0.0–0.5)
Eosinophils Relative: 0 %
HCT: 45 % (ref 39.0–52.0)
Hemoglobin: 14.7 g/dL (ref 13.0–17.0)
Immature Granulocytes: 8 %
Lymphocytes Relative: 18 %
Lymphs Abs: 1.6 10*3/uL (ref 0.7–4.0)
MCH: 31 pg (ref 26.0–34.0)
MCHC: 32.7 g/dL (ref 30.0–36.0)
MCV: 94.9 fL (ref 80.0–100.0)
Monocytes Absolute: 0.5 10*3/uL (ref 0.1–1.0)
Monocytes Relative: 6 %
Neutro Abs: 6.1 10*3/uL (ref 1.7–7.7)
Neutrophils Relative %: 68 %
Platelets: 160 10*3/uL (ref 150–400)
RBC: 4.74 MIL/uL (ref 4.22–5.81)
RDW: 14.1 % (ref 11.5–15.5)
Smear Review: NORMAL
WBC: 9.1 10*3/uL (ref 4.0–10.5)
nRBC: 0.4 % — ABNORMAL HIGH (ref 0.0–0.2)

## 2020-10-09 LAB — COMPREHENSIVE METABOLIC PANEL
ALT: 30 U/L (ref 0–44)
AST: 51 U/L — ABNORMAL HIGH (ref 15–41)
Albumin: 2.9 g/dL — ABNORMAL LOW (ref 3.5–5.0)
Alkaline Phosphatase: 118 U/L (ref 38–126)
Anion gap: 11 (ref 5–15)
BUN: 16 mg/dL (ref 8–23)
CO2: 25 mmol/L (ref 22–32)
Calcium: 9.7 mg/dL (ref 8.9–10.3)
Chloride: 100 mmol/L (ref 98–111)
Creatinine, Ser: 0.68 mg/dL (ref 0.61–1.24)
GFR, Estimated: >60 mL/min (ref >60)
Glucose, Bld: 207 mg/dL — ABNORMAL HIGH (ref 70–99)
Potassium: 4 mmol/L (ref 3.5–5.1)
Sodium: 136 mmol/L (ref 135–145)
Total Bilirubin: 0.8 mg/dL (ref 0.3–1.2)
Total Protein: 6.9 g/dL (ref 6.5–8.1)

## 2020-10-09 MED ORDER — SODIUM CHLORIDE 0.9 % IV SOLN
1200.0000 mg | Freq: Once | INTRAVENOUS | Status: AC
  Administered 2020-10-09: 1200 mg via INTRAVENOUS
  Filled 2020-10-09: qty 20

## 2020-10-09 MED ORDER — SODIUM CHLORIDE 0.9% FLUSH
10.0000 mL | INTRAVENOUS | Status: DC | PRN
  Administered 2020-10-09: 10 mL via INTRAVENOUS
  Filled 2020-10-09: qty 10

## 2020-10-09 MED ORDER — SODIUM CHLORIDE 0.9 % IV SOLN
Freq: Once | INTRAVENOUS | Status: AC
Start: 2020-10-09 — End: 2020-10-09
  Filled 2020-10-09: qty 250

## 2020-10-09 MED ORDER — HEPARIN SOD (PORK) LOCK FLUSH 100 UNIT/ML IV SOLN
500.0000 [IU] | Freq: Once | INTRAVENOUS | Status: AC
  Filled 2020-10-09: qty 5

## 2020-10-09 MED ORDER — OXYCODONE HCL 5 MG PO TABS: 5.0000 mg | ORAL_TABLET | ORAL | 0 refills | Status: DC | PRN

## 2020-10-09 MED ORDER — HEPARIN SOD (PORK) LOCK FLUSH 100 UNIT/ML IV SOLN
INTRAVENOUS | Status: AC
  Administered 2020-10-09: 500 [IU] via INTRAVENOUS
  Filled 2020-10-09: qty 5

## 2020-10-09 MED ORDER — ZOLEDRONIC ACID 4 MG/100ML IV SOLN
4.0000 mg | INTRAVENOUS | Status: DC
  Administered 2020-10-09: 4 mg via INTRAVENOUS
  Filled 2020-10-09: qty 100

## 2020-10-09 MED ORDER — HEPARIN SOD (PORK) LOCK FLUSH 100 UNIT/ML IV SOLN
500.0000 [IU] | Freq: Once | INTRAVENOUS | Status: DC | PRN
  Filled 2020-10-09: qty 5

## 2020-10-09 NOTE — Patient Instructions (Signed)
[facility]  Discharge Instructions: Thank you for choosing [facility] to provide your oncology and hematology care.  If you have a lab appointment with the [facility], please go directly to the [facility] and check in at the registration area.  Wear comfortable clothing and clothing appropriate for easy access to any Portacath or PICC line.   We strive to give you quality time with your provider. You may need to reschedule your appointment if you arrive late (15 or more minutes).  Arriving late affects you and other patients whose appointments are after yours.  Also, if you miss three or more appointments without notifying the office, you may be dismissed from the clinic at the provider's discretion.      For prescription refill requests, have your pharmacy contact our office and allow 72 hours for refills to be completed.    Today you received the following chemotherapy and/or immunotherapy agents Tecentriq      To help prevent nausea and vomiting after your treatment, we encourage you to take your nausea medication as directed.  BELOW ARE SYMPTOMS THAT SHOULD BE REPORTED IMMEDIATELY: *FEVER GREATER THAN 100.4 F (38 C) OR HIGHER *CHILLS OR SWEATING *NAUSEA AND VOMITING THAT IS NOT CONTROLLED WITH YOUR NAUSEA MEDICATION *UNUSUAL SHORTNESS OF BREATH *UNUSUAL BRUISING OR BLEEDING *URINARY PROBLEMS (pain or burning when urinating, or frequent urination) *BOWEL PROBLEMS (unusual diarrhea, constipation, pain near the anus) TENDERNESS IN MOUTH AND THROAT WITH OR WITHOUT PRESENCE OF ULCERS (sore throat, sores in mouth, or a toothache) UNUSUAL RASH, SWELLING OR PAIN  UNUSUAL VAGINAL DISCHARGE OR ITCHING   Items with * indicate a potential emergency and should be followed up as soon as possible or go to the Emergency Department if any problems should occur.  Please show the CHEMOTHERAPY ALERT CARD or IMMUNOTHERAPY ALERT CARD at check-in  to the Emergency Department and triage nurse.  Should you have questions after your visit or need to cancel or reschedule your appointment, please contact [facility]  [phone] and follow the prompts.  Office hours are 8:00 a.m. to 4:30 p.m. Monday - Friday. Please note that voicemails left after 4:00 p.m. may not be returned until the following business day.  We are closed weekends and major holidays. You have access to a nurse at all times for urgent questions. Please call the main number to the clinic [phone] and follow the prompts.  For any non-urgent questions, you may also contact your provider using MyChart. We now offer e-Visits for anyone 62 and older to request care online for non-urgent symptoms. For details visit [email or web address].   Also download the MyChart app! Go to the app store, search "MyChart", open the app, select [facility], and log in with your MyChart username and password.  Due to Covid, a mask is required upon entering the hospital/clinic. If you do not have a mask, one will be given to you upon arrival. For doctor visits, patients may have 1 support person aged 87 or older with them. For treatment visits, patients cannot have anyone with them due to current Covid guidelines and our immunocompromised population.    Zoledronic Acid Injection (Hypercalcemia, Oncology) What is this medication? ZOLEDRONIC ACID (ZOE le dron ik AS id) slows calcium loss from bones. It high calcium levels in the blood from some kinds of cancer. It may be used in otherpeople at risk for bone loss. This medicine may be used for other purposes; ask  your health care provider orpharmacist if you have questions. COMMON BRAND NAME(S): Zometa What should I tell my care team before I take this medication? They need to know if you have any of these conditions: cancer dehydration dental disease kidney disease liver disease low levels of calcium in the  blood lung or breathing disease (asthma) receiving steroids like dexamethasone or prednisone an unusual or allergic reaction to zoledronic acid, other medicines, foods, dyes, or preservatives pregnant or trying to get pregnant breast-feeding How should I use this medication? This drug is injected into a vein. It is given by a health care provider in [place] or clinic setting. Talk to your health care provider about the use of this drug in children.Special care may be needed. Overdosage: If you think you have taken too much of this medicine contact apoison control center or emergency room at once. NOTE: This medicine is only for you. Do not share this medicine with others. What if I miss a dose? Keep appointments for follow-up doses. It is important not to miss your dose.Call your health care provider if you are unable to keep an appointment. What may interact with this medication? certain antibiotics given by injection NSAIDs, medicines for pain and inflammation, like ibuprofen or naproxen some diuretics like bumetanide, furosemide teriparatide thalidomide This list may not describe all possible interactions. Give your health care provider a list of all the medicines, herbs, non-prescription drugs, or dietary supplements you use. Also tell them if you smoke, drink alcohol, or use illegaldrugs. Some items may interact with your medicine. What should I watch for while using this medication? Visit your health care provider for regular checks on your progress. It may besome time before you see the benefit from this drug. Some people who take this drug have severe bone, joint, or muscle pain. This drug may also increase your risk for jaw problems or a broken thigh bone. Tell your health care provider right away if you have severe pain in your jaw, bones, joints, or muscles. Tell you health care provider if you have any painthat does not go away or that gets worse. Tell your dentist and dental  surgeon that you are taking this drug. You should not have major dental surgery while on this drug. See your dentist to have a dental exam and fix any dental problems before starting this drug. Take good care of your teeth while on this drug. Make sure you see your dentist forregular follow-up appointments. You should make sure you get enough calcium and vitamin D while you are taking this drug. Discuss the foods you eat and the vitamins you take with your healthcare provider. Check with your health care provider if you have severe diarrhea, nausea, and vomiting, or if you sweat a lot. The loss of too much body fluid may make itdangerous for you to take this drug. You may need blood work done while you are taking this drug. Do not become pregnant while taking this drug. Women should inform their health care provider if they wish to become pregnant or think they might be pregnant. There is potential for serious harm to an unborn child. Talk to your healthcare provider for more information. What side effects may I notice from receiving this medication? Side effects that you should report to your doctor or health care provider assoon as possible: allergic reactions (skin rash, itching or hives; swelling of the face, lips, or tongue) bone pain infection (fever, chills, cough, sore throat, pain or trouble passing  urine) jaw pain, especially after dental work joint pain kidney injury (trouble passing urine or change in the amount of urine) low blood pressure (dizziness; feeling faint or lightheaded, falls; unusually weak or tired) low calcium levels (fast heartbeat; muscle cramps or pain; pain, tingling, or numbness in the hands or feet; seizures) low magnesium levels (fast, irregular heartbeat; muscle cramp or pain; muscle weakness; tremors; seizures) low red blood cell counts (trouble breathing; feeling faint; lightheaded, falls; unusually weak or tired) muscle pain redness, blistering, peeling, or  loosening of the skin, including inside the mouth severe diarrhea swelling of the ankles, feet, hands trouble breathing Side effects that usually do not require medical attention (report to yourdoctor or health care provider if they continue or are bothersome): anxious constipation coughing depressed mood eye irritation, itching, or pain fever general ill feeling or flu-like symptoms nausea pain, redness, or irritation at site where injected trouble sleeping This list may not describe all possible side effects. Call your doctor for medical advice about side effects. You may report side effects to FDA at1-800-FDA-1088. Where should I keep my medication? This drug is given in a hospital or clinic. It will not be stored at home. NOTE: This sheet is a summary. It may not cover all possible information. If you have questions about this medicine, talk to your doctor, pharmacist, orhealth care provider.  2022 Elsevier/Gold Standard (2018-11-26 09:13:00)

## 2020-10-09 NOTE — Progress Notes (Signed)
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  Past medical history significant for COPD, GERD and obstructive sleep apnea.  CT angio chest abdomen pelvis showed 6.4 x 3.7 cm right paratracheal adenopathy concerning for malignancy.  1.9 cm right hilar node.  12 mm anterior mediastinal lymph node.  2.8 cm right thyroid nodule.  Several pleural-based masses concerning for metastatic disease.  Large irregular opacity in the right upper lobe which may be malignancy versus postobstructive atelectasis.  Lytic lesion noted in the manubrium consistent with metastatic disease.  Moderate right-sided pleural effusion s/p thoracentesis.    Cytology showedPoorly differentiated carcinoma with neuroendocrine differentiation.  Tumor cells were positive for pancytokeratin and CD56 but negative for TTF-1 p40 and synaptophysin.  Chromogranin stain was also negative.   MRI brain was negative for metastatic disease   Patient received 4 cycles of carboplatin etoposide and Tecentriq until May 2022 and following that he has been on maintenance Tecentriq   Interval history-patient still reports ongoing fatigue.  Diffuse myalgias and body aches.  Some particular points especially over his left shoulder hurt.  He has also been feeling constipated and has not had a bowel movement for over a week.  He has been  using over-the-counter bowel medications without any significant benefit.  ECOG PS- 1 Pain scale- 4 Opioid associated constipation- yes  Review of systems- Review of Systems  Constitutional:  Positive for malaise/fatigue. Negative for chills, fever and weight loss.  HENT:  Negative for congestion, ear discharge and nosebleeds.   Eyes:  Negative for blurred vision.  Respiratory:  Negative for cough, hemoptysis, sputum production, shortness of breath and wheezing.   Cardiovascular:  Negative for chest pain, palpitations, orthopnea and claudication.  Gastrointestinal:  Positive for constipation. Negative for abdominal pain, blood in stool, diarrhea, heartburn, melena, nausea and vomiting.  Genitourinary:  Negative for dysuria, flank pain, frequency, hematuria and urgency.  Musculoskeletal:  Positive for back pain and myalgias. Negative for joint pain.  Skin:  Negative for rash.  Neurological:  Negative for dizziness, tingling, focal weakness, seizures, weakness and headaches.  Endo/Heme/Allergies:  Does not bruise/bleed easily.  Psychiatric/Behavioral:  Negative for depression and suicidal ideas. The patient does not have insomnia.    Allergies  Allergen Reactions   Cephalexin Rash     Past Medical History:  Diagnosis Date   Adenomatous polyps    Asthma    Barrett's esophagus    Colon polyp    COPD (chronic obstructive pulmonary disease) ([place])    uses inhaler, "i may have COPD"   Dyspnea    Eye injury    GERD (gastroesophageal reflux disease)    History of gonorrhea    History of pneumonia    Lung cancer ([place])    Obesity    Pneumonia    Reflux esophagitis    Sleep apnea      Past Surgical History:  Procedure Laterality Date  CATARACT EXTRACTION Left    COLONOSCOPY     COLONOSCOPY WITH PROPOFOL N/A 05/19/2015   Procedure: COLONOSCOPY WITH PROPOFOL;  Surgeon: [name], MD;  Location: [facility] ENDOSCOPY;  Service: Endoscopy;  Laterality: N/A;   COLONOSCOPY WITH  PROPOFOL N/A 09/11/2018   Procedure: COLONOSCOPY WITH PROPOFOL;  Surgeon: [name], MD;  Location: [facility] ENDOSCOPY;  Service: Endoscopy;  Laterality: N/A;   ESOPHAGOGASTRODUODENOSCOPY (EGD) WITH PROPOFOL N/A 05/19/2015   Procedure: ESOPHAGOGASTRODUODENOSCOPY (EGD) WITH PROPOFOL;  Surgeon: [name], MD;  Location: [facility] ENDOSCOPY;  Service: Endoscopy;  Laterality: N/A;   ESOPHAGOGASTRODUODENOSCOPY (EGD) WITH PROPOFOL N/A 10/11/2016   Procedure: ESOPHAGOGASTRODUODENOSCOPY (EGD) WITH PROPOFOL;  Surgeon: [name], MD;  Location: [facility] ENDOSCOPY;  Service: Endoscopy;  Laterality: N/A;   ESOPHAGOGASTRODUODENOSCOPY (EGD) WITH PROPOFOL N/A 01/01/2018   Procedure: ESOPHAGOGASTRODUODENOSCOPY (EGD) WITH PROPOFOL;  Surgeon: [name], MD;  Location: [facility] ENDOSCOPY;  Service: Endoscopy;  Laterality: N/A;   ESOPHAGOGASTRODUODENOSCOPY (EGD) WITH PROPOFOL N/A 09/11/2018   Procedure: ESOPHAGOGASTRODUODENOSCOPY (EGD) WITH PROPOFOL;  Surgeon: [name], MD;  Location: [facility] ENDOSCOPY;  Service: Endoscopy;  Laterality: N/A;   EYE SURGERY     IR IMAGING GUIDED PORT INSERTION  04/21/2020    Social History   Socioeconomic History   Marital status: Married    Spouse name: Not on file   Number of children: Not on file   Years of education: Not on file   Highest education level: Not on file  Occupational History   Not on file  Tobacco Use   Smoking status: Every Day    Packs/day: 1.50    Years: 35.00    Pack years: 52.50    Types: Cigarettes, Cigars    Last attempt to quit: 03/04/2015    Years since quitting: 5.6   Smokeless tobacco: Never  Vaping Use   Vaping Use: Former  Substance and Sexual Activity   Alcohol use: Not Currently    Comment: none or 3 weeks    Drug use: No   Sexual activity: Not on file  Other Topics Concern   Not on file  Social History Narrative   Not on file   Social Determinants of Health   Financial Resource Strain: Not on file  Food  Insecurity: Not on file  Transportation Needs: Not on file  Physical Activity: Not on file  Stress: Not on file  Social Connections: Not on file  Intimate Partner Violence: Not on file    Family History  Problem Relation Age of Onset   Emphysema Mother      Current Outpatient Medications:    albuterol (PROVENTIL HFA;VENTOLIN HFA) 108 (90 Base) MCG/ACT inhaler, 2 puffs q.i.d. p.r.n. short of breath, wheezing, or cough, Disp: , Rfl:    fentaNYL (DURAGESIC) 25 MCG/HR, Place 1 patch onto the skin every 3 (three) days., Disp: 5 patch, Rfl: 0   levothyroxine (SYNTHROID) 150 MCG tablet, Take 1 tablet (150 mcg total) by mouth daily before breakfast., Disp: 30 tablet, Rfl: 2   lidocaine-prilocaine (EMLA) cream, Apply to affected area once, Disp: 30 g, Rfl: 3   LORazepam (ATIVAN) 0.5 MG tablet, Take 1 tablet (0.5 mg total) by mouth 3 (three) times daily as needed (Nausea or vomiting)., Disp: 30 tablet, Rfl: 0   Morphine Sulfate, Concentrate, 5 MG/0.25ML SOLN, Take 5 mg by mouth every 4 (four) hours as needed. For dyspnea, Disp: 30 mL, Rfl: 0   ondansetron (ZOFRAN) 8 MG tablet, Take 1 tablet (8 mg total) by mouth 2 (  two) times daily as needed for refractory nausea / vomiting. Start on day 3 after carboplatin chemo., Disp: 30 tablet, Rfl: 1   prochlorperazine (COMPAZINE) 10 MG tablet, Take 1 tablet (10 mg total) by mouth every 6 (six) hours as needed (Nausea or vomiting)., Disp: 30 tablet, Rfl: 1   oxyCODONE (OXY IR/ROXICODONE) 5 MG immediate release tablet, Take 1 tablet (5 mg total) by mouth every 4 (four) hours as needed for severe pain., Disp: 180 tablet, Rfl: 0 No current facility-administered medications for this visit.  Facility-Administered Medications Ordered in Other Visits:    heparin lock flush 100 unit/mL, 500 Units, Intracatheter, Once PRN, Sindy Guadeloupe, MD   sodium chloride flush (NS) 0.9 % injection 10 mL, 10 mL, Intravenous, PRN, Sindy Guadeloupe, MD, 10 mL at 10/09/20 0811    Zoledronic Acid (ZOMETA) IVPB 4 mg, 4 mg, Intravenous, Q28 days, Sindy Guadeloupe, MD, Stopped at 10/09/20 0949  Physical exam:  Vitals:   10/09/20 0838  BP: 130/73  Pulse: 91  Resp: 20  Temp: (!) 97 F (36.1 C)  TempSrc: Tympanic  SpO2: 96%  Weight: (!) 322 lb 8 oz (146.3 kg)   Physical Exam Constitutional:      General: He is not in acute distress.    Comments: Sitting in a wheelchair.  Appears fatigued  Cardiovascular:     Rate and Rhythm: Normal rate and regular rhythm.     Heart sounds: Normal heart sounds.  Pulmonary:     Effort: Pulmonary effort is normal.     Breath sounds: Normal breath sounds.  Abdominal:     General: Bowel sounds are normal.     Palpations: Abdomen is soft.  Skin:    General: Skin is warm and dry.  Neurological:     Mental Status: He is alert and oriented to person, place, and time.     CMP Latest Ref Rng & Units 10/09/2020  Glucose 70 - 99 mg/dL 207(H)  BUN 8 - 23 mg/dL 16  Creatinine 0.61 - 1.24 mg/dL 0.68  Sodium 135 - 145 mmol/L 136  Potassium 3.5 - 5.1 mmol/L 4.0  Chloride 98 - 111 mmol/L 100  CO2 22 - 32 mmol/L 25  Calcium 8.9 - 10.3 mg/dL 9.7  Total Protein 6.5 - 8.1 g/dL 6.9  Total Bilirubin 0.3 - 1.2 mg/dL 0.8  Alkaline Phos 38 - 126 U/L 118  AST 15 - 41 U/L 51(H)  ALT 0 - 44 U/L 30   CBC Latest Ref Rng & Units 10/09/2020  WBC 4.0 - 10.5 K/uL 9.1  Hemoglobin 13.0 - 17.0 g/dL 14.7  Hematocrit 39.0 - 52.0 % 45.0  Platelets 150 - 400 K/uL 160    No images are attached to the encounter.  NM Bone Scan Whole Body  Result Date: 10/03/2020 CLINICAL DATA:  Small cell lung cancer, low back pain radiating to sternum and down both arms for 6 months worsened over time EXAM: NUCLEAR MEDICINE WHOLE BODY BONE SCAN TECHNIQUE: Whole body anterior and posterior images were obtained approximately 3 hours after intravenous injection of radiopharmaceutical. RADIOPHARMACEUTICALS:  20.53 mCi Technetium-58m MDP IV COMPARISON:  None Correlation: CT  chest abdomen pelvis 10/02/2020, PET-CT 04/11/2020 FINDINGS: Abnormal tracer uptake within manubrium corresponding to destructive bone lesion on CT consistent with metastasis. Uptake at shoulders, sternoclavicular joints, typically degenerative. Questionable foci of abnormal tracer uptake at posterior LEFT eighth rib and posterolateral LEFT ninth rib concerning for metastases. Subtle foci of increased tracer uptake at the distal femoral metaphysis  bilaterally and at LEFT iliac bone laterally concerning for metastases. Expected urinary tract and soft tissue distribution of tracer. IMPRESSION: Metastatic focus involving manubrium. Concern for subtle metastases at distal femoral metaphyses bilaterally and LEFT iliac bone. Questionable sites of osseous metastatic disease involving RIGHT eighth and ninth ribs. Electronically Signed   By: Lavonia Dana M.D.   On: 10/03/2020 08:39   CT CHEST ABDOMEN PELVIS W CONTRAST  Result Date: 10/02/2020 CLINICAL DATA:  Metastatic small cell lung cancer. Chemotherapy ongoing. EXAM: CT CHEST, ABDOMEN, AND PELVIS WITH CONTRAST TECHNIQUE: Multidetector CT imaging of the chest, abdomen and pelvis was performed following the standard protocol during bolus administration of intravenous contrast. CONTRAST:  151mL OMNIPAQUE IOHEXOL 350 MG/ML SOLN COMPARISON:  CT 06/22/2020 FINDINGS: CT CHEST FINDINGS Cardiovascular: No significant vascular findings. Normal heart size. No pericardial effusion. Port in the anterior chest wall with tip in distal SVC. Mediastinum/Nodes: RIGHT lower paratracheal ill-defined nodal mass measures 15 mm short axis (image 27/series 2) unchanged from 15 mm. No new mediastinal adenopathy. High RIGHT paratracheal node measures 5 mm decreased from 6 mm. Lungs/Pleura: Posterior nodule in the RIGHT upper lobe measures 16 mm (image 64/4) compared to 18 mm. No new pulmonary nodularity. Moderate size RIGHT pleural effusion is slightly decreased in volume. Musculoskeletal:  Aggressive lesion in the manubrium with destruction of the posterior cortex measures 23 mm compared with 25 mm for no interval change. CT ABDOMEN AND PELVIS FINDINGS Hepatobiliary: Subtle hypodense lesion in the inferior RIGHT hepatic lobe measures approximately 8 mm (image 85/series 2). Similar adjacent poorly defined hypodense lesion on image 86 in the RIGHT hepatic lobe. 12 mm superior RIGHT hepatic lobe lesion on image 61/2. These subtle lesions are not identified on comparison CT. Gallstones again noted. Pancreas: Pancreas is normal. No ductal dilatation. No pancreatic inflammation. Spleen: Normal spleen Adrenals/urinary tract: Adrenal glands and kidneys are normal. The ureters and bladder normal. Stomach/Bowel: Stomach, small bowel, appendix, and cecum are normal. The colon and rectosigmoid colon are normal. Vascular/Lymphatic: Abdominal aorta is normal caliber. There is no retroperitoneal or periportal lymphadenopathy. No pelvic lymphadenopathy. Reproductive: Unremarkable Other: No peritoneal disease Musculoskeletal: Subtle sclerotic lesion in the LEFT iliac bone is unchanged (image 109/series 2. Round sclerotic lesion in the T11 vertebral body (image 58/2) more conspicuous than comparison exam. IMPRESSION: Chest Impression: 1. Stable treated nodule in RIGHT upper lobe. 2. Stable small mediastinal lymph nodes. 3. Persistent RIGHT pleural effusion. 4. No evidence of disease progression in the thorax. Abdomen / Pelvis Impression: 1. Several new subtle hypodense lesions in the RIGHT hepatic lobe are a concerning finding and warrant further investigation with contrast hepatic MRI. 2. Round sclerotic lesion in the T11 vertebral body is more conspicuous which could represent a positive treatment response. Stable lytic lesion in the manubrium. These results will be called to the ordering clinician or representative by the Radiologist Assistant, and communication documented in the PACS or [employer].  Electronically Signed   By: Suzy Bouchard M.D.   On: 10/02/2020 16:49   ECHOCARDIOGRAM COMPLETE  Result Date: 10/04/2020    ECHOCARDIOGRAM REPORT   Patient Name:   CHANDRA FEGER Date of Exam: 10/04/2020 Medical Rec #:  [id number]      Height:       73.0 in Accession #:    [id number]     Weight:       326.4 lb Date of Birth:  09/09/1945      BSA:          2.650  m Patient Age:    75 years       BP:           129/68 mmHg Patient Gender: M              HR:           76 bpm. Exam Location:  [facility] Procedure: 2D Echo, Color Doppler, Cardiac Doppler and Intracardiac            Opacification Agent Indications:     R07.9 Chest Pain  History:         Patient has no prior history of Echocardiogram examinations.                  COPD; Risk Factors:Sleep Apnea. Asthma.  Sonographer:     Charmayne Sheer RDCS (AE) Referring Phys:  [id number] Weston Anna Adebayo Ensminger Diagnosing Phys: Kate Sable MD  Sonographer Comments: Technically difficult study due to poor echo windows. Image acquisition challenging due to patient body habitus and Image acquisition challenging due to COPD. IMPRESSIONS  1. Left ventricular ejection fraction, by estimation, is 55 to 60%. The left ventricle has normal function. The left ventricle has no regional wall motion abnormalities. Left ventricular diastolic parameters were normal.  2. Right ventricular systolic function is normal. The right ventricular size is normal.  3. The mitral valve is normal in structure. No evidence of mitral valve regurgitation.  4. The aortic valve is normal in structure. Aortic valve regurgitation is not visualized. FINDINGS  Left Ventricle: Left ventricular ejection fraction, by estimation, is 55 to 60%. The left ventricle has normal function. The left ventricle has no regional wall motion abnormalities. Definity contrast agent was given IV to delineate the left ventricular  endocardial borders. The left ventricular internal cavity size was normal in size. There is no left ventricular  hypertrophy. Left ventricular diastolic parameters were normal. Right Ventricle: The right ventricular size is normal. No increase in right ventricular wall thickness. Right ventricular systolic function is normal. Left Atrium: Left atrial size was normal in size. Right Atrium: Right atrial size was normal in size. Pericardium: There is no evidence of pericardial effusion. Mitral Valve: The mitral valve is normal in structure. No evidence of mitral valve regurgitation. MV peak gradient, 2.0 mmHg. The mean mitral valve gradient is 1.0 mmHg. Tricuspid Valve: The tricuspid valve is not well visualized. Tricuspid valve regurgitation is not demonstrated. Aortic Valve: The aortic valve is normal in structure. Aortic valve regurgitation is not visualized. Pulmonic Valve: The pulmonic valve was normal in structure. Pulmonic valve regurgitation is not visualized. Aorta: The aortic root is normal in size and structure. Venous: The inferior vena cava was not well visualized. IAS/Shunts: No atrial level shunt detected by color flow Doppler.  LEFT VENTRICLE PLAX 2D LVIDd:         5.60 cm  Diastology LVIDs:         4.00 cm  LV e' medial:    7.18 cm/s LV PW:         1.20 cm  LV E/e' medial:  8.5 LV IVS:        1.10 cm  LV e' lateral:   7.94 cm/s LVOT diam:     2.20 cm  LV E/e' lateral: 7.7 LVOT Area:     3.80 cm  RIGHT VENTRICLE RV Basal diam:  2.80 cm LEFT ATRIUM           Index LA diam:      3.40 cm 1.28 cm/m  LA Vol (A4C): 87.1 ml 32.87 ml/m                        PULMONIC VALVE AORTA                 PV Vmax:       0.78 m/s Ao Root diam: 3.30 cm PV Vmean:      48.100 cm/s                       PV VTI:        0.119 m                       PV Peak grad:  2.4 mmHg                       PV Mean grad:  1.0 mmHg  MITRAL VALVE MV Area (PHT): 2.52 cm    SHUNTS MV Peak grad:  2.0 mmHg    Systemic Diam: 2.20 cm MV Mean grad:  1.0 mmHg MV Vmax:       0.72 m/s MV Vmean:      46.1 cm/s MV Decel Time: 301 msec MV E velocity: 60.80 cm/s MV  A velocity: 66.80 cm/s MV E/A ratio:  0.91 Kate Sable MD Electronically signed by Kate Sable MD Signature Date/Time: 10/04/2020/7:11:46 PM    Final      Assessment and plan- Patient is a 75 y.o. male  with extensive stage small cell lung cancer s/p 4 cycles of carbo etoposide Tecentriq chemotherapy.  He is here for on treatment assessment prior to cycle 5 of maintenance Tecentriq  I have reviewed CT chest abdomen pelvis images independently and discussed findings with the patient.  Overall bone disease appears stable.  Improvement in mediastinal adenopathy as well asPrimary right upper lobe lung mass.  Multiple small hypodensities seen in the liver and I will obtain MRI abdomen with and without contrast to characterize these further.  My plan at this time is to continue Tecentriq until progression or toxicity.  Bone metastases: We will receive Zometa today  Body aches/myalgias: This could be very well secondary to uncontrolled hypothyroidism.  Levothyroxine dosage was increased to 150 mcg 3 weeks ago.  Repeat TSH again in 3 weeks  Neoplasm related pain: It is unclear of the pain that the patient is experiencing is due to malignancy versus uncontrolled hypothyroidism which can cause some myalgias and body aches as well.  We have increased his fentanyl patch 25 mcg and I will start him on oxycodone 5 mg every 4 hours as needed  Opioid-induced constipation: He will start on MiraLAX twice a day and senna 2 tablets at night.  I will also have NP Altha Harm from palliative care follow-up with him later this week and titrate his bowel medications if need be  I will see him back in 3 weeks for next cycle of Tecentriq   Visit Diagnosis 1. Small cell lung cancer, right ([place])   2. Encounter for antineoplastic immunotherapy   3. Neoplasm related pain      Dr. Randa Evens, MD, MPH [facility] [phone] 10/09/2020 12:40 PM

## 2020-10-12 ENCOUNTER — Other Ambulatory Visit: Payer: Self-pay

## 2020-10-12 ENCOUNTER — Inpatient Hospital Stay (HOSPITAL_BASED_OUTPATIENT_CLINIC_OR_DEPARTMENT_OTHER): Payer: Managed Care, Other (non HMO) | Admitting: Hospice and Palliative Medicine

## 2020-10-12 DIAGNOSIS — K5903 Drug induced constipation: Secondary | ICD-10-CM

## 2020-10-12 DIAGNOSIS — G893 Neoplasm related pain (acute) (chronic): Secondary | ICD-10-CM

## 2020-10-12 DIAGNOSIS — C3491 Malignant neoplasm of unspecified part of right bronchus or lung: Secondary | ICD-10-CM

## 2020-10-12 DIAGNOSIS — Z515 Encounter for palliative care: Secondary | ICD-10-CM

## 2020-10-12 MED ORDER — SENNOSIDES-DOCUSATE SODIUM 8.6-50 MG PO TABS: 1.0000 | ORAL_TABLET | Freq: Two times a day (BID) | ORAL | 3 refills | Status: AC | PRN

## 2020-10-12 MED ORDER — LACTULOSE 10 GM/15ML PO SOLN: 10.0000 g | Freq: Three times a day (TID) | ORAL | 0 refills | Status: AC | PRN

## 2020-10-12 MED ORDER — POLYETHYLENE GLYCOL 3350 17 G PO PACK: 17.0000 g | PACK | Freq: Every day | ORAL | 2 refills | Status: AC

## 2020-10-12 NOTE — Progress Notes (Signed)
Virtual Visit via Video Note  I connected with [name] on 10/12/20 at 10:30 AM EDT by a video enabled telemedicine application and verified that I am speaking with the correct person using two identifiers.  Location: Patient: Home Provider: Clinic   I discussed the limitations of evaluation and management by telemedicine and the availability of in person appointments. The patient expressed understanding and agreed to proceed.  History of Present Illness: David Johnson is a 75 y.o. 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  PMH is also notable for COPD and OSA requiring CPAP.  Palliative care was consulted help address goals manage ongoing symptoms.   Observations/Objective: Patient saw Dr. Janese Banks on 10/09/2020 at which time he was complaining of severe myalgias.  His transdermal fentanyl was increased to 25 mcg every 72 hours and patient was started on oxycodone 5 mg every 4 hours as needed.  Patient was also reportedly constipated and was started on a daily bowel regimen.  I spoke with patient today to follow-up on previous in clinic visit.  Patient reports that his current pain regimen is effective.  He averages pain as 3-5 out of 10.  Pain is currently 1 out of 10 as long as he takes oxycodone every 4 hours around-the-clock.  Patient continues to endorse constipation.  He says that his last bowel movement was on 8/7.  He is averaging a bowel movement about once a week.  He has started senna S, which he is taking twice daily and MiraLAX.  He is passing flatus and denies nausea or vomiting or abdominal pain/distention.  He feels like he has the urge to defecate.  Discussed bowel regimen extensively and suggested liberalizing senna and MiraLAX.  He may also try Dulcolax suppository and enemas.  We will also proceed with prescribing lactulose to have on hand if needed.  Assessment  and Plan: Neoplasm related pain -continue transdermal fentanyl/oxycodone  Constipation -likely secondary to opioids.  Continue senna/MiraLAX daily.  Add lactulose as needed.  Target is a bowel movement every day or every other day.   Follow Up Instructions: Follow-up MyChart visit tomorrow   I discussed the assessment and treatment plan with the patient. The patient was provided an opportunity to ask questions and all were answered. The patient agreed with the plan and demonstrated an understanding of the instructions.   The patient was advised to call back or seek an in-person evaluation if the symptoms worsen or if the condition fails to improve as anticipated.  I provided 30 minutes of non-face-to-face time during this encounter.   Irean Hong, NP

## 2020-10-13 ENCOUNTER — Inpatient Hospital Stay (HOSPITAL_BASED_OUTPATIENT_CLINIC_OR_DEPARTMENT_OTHER): Payer: Managed Care, Other (non HMO) | Admitting: Hospice and Palliative Medicine

## 2020-10-13 DIAGNOSIS — C3491 Malignant neoplasm of unspecified part of right bronchus or lung: Secondary | ICD-10-CM | POA: Diagnosis not present

## 2020-10-13 NOTE — Progress Notes (Signed)
Virtual Visit via Video Note  I connected with David Johnson on 10/13/20 at 11:30 AM EDT by a video enabled telemedicine application and verified that I am speaking with the correct person using two identifiers.  Location: Patient: Home Provider: Clinic   I discussed the limitations of evaluation and management by telemedicine and the availability of in person appointments. The patient expressed understanding and agreed to proceed.  History of Present Illness: David Johnson is a 75 y.o. 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  PMH is also notable for COPD and OSA requiring CPAP.  Palliative care was consulted help address goals manage ongoing symptoms.   Observations/Objective: I spoke with patient by phone.  He continues to endorse constipation without successful bowel movement today.  He is still passing flatus and denies nausea or vomiting.  No abdominal distention.  He has continued taking the MiraLAX and senna but has not yet picked up the lactulose from the pharmacy.  I encouraged him to try the lactulose.  We did discuss triggers for utilizing ER including worsening symptoms or development of nausea, vomiting, abdominal distention, or pain.  Assessment and Plan: Neoplasm related pain -continue transdermal fentanyl/oxycodone  Constipation -likely secondary to opioids.  Continue senna/MiraLAX daily.  Patient to try lactulose as needed.  Target is a bowel movement every day or every other day. Could try Movantik if needed.    Follow Up Instructions: Follow-up MyChart visit next week   I discussed the assessment and treatment plan with the patient. The patient was provided an opportunity to ask questions and all were answered. The patient agreed with the plan and demonstrated an understanding of the instructions.   The patient was advised to call back or seek an in-person  evaluation if the symptoms worsen or if the condition fails to improve as anticipated.  I provided 15 minutes of non-face-to-face time during this encounter.   Irean Hong, NP

## 2020-10-18 ENCOUNTER — Encounter: Payer: Self-pay | Admitting: Hospice and Palliative Medicine

## 2020-10-18 MED ORDER — OXYCODONE HCL 5 MG PO TABS: 5.0000 mg | ORAL_TABLET | ORAL | 0 refills | Status: DC | PRN

## 2020-10-18 MED ORDER — FENTANYL 25 MCG/HR TD PT72: 1.0000 | MEDICATED_PATCH | TRANSDERMAL | 0 refills | Status: DC

## 2020-10-18 NOTE — Telephone Encounter (Signed)
I called and spoke with patient by phone.  We will refill his fentanyl.  Okay to increase oxycodone 5 to 10 mg every 4 hours as needed for breakthrough pain.  We discussed ongoing constipation management with senna/MiraLAX and/or as needed lactulose.  Patient will monitor shortness of breath.  Discussed option for chest x-ray versus thoracentesis if needed.

## 2020-10-19 ENCOUNTER — Emergency Department
Admission: EM | Admit: 2020-10-19 | Discharge: 2020-10-19 | Disposition: A | Discharge status: Home / Self Care | Payer: Managed Care, Other (non HMO) | Attending: Emergency Medicine | Admitting: Emergency Medicine

## 2020-10-19 ENCOUNTER — Ambulatory Visit
Admission: RE | Admit: 2020-10-19 | Discharge: 2020-10-19 | Disposition: A | Discharge status: Home / Self Care | Payer: Managed Care, Other (non HMO) | Source: Ambulatory Visit | Attending: Oncology | Admitting: Oncology

## 2020-10-19 ENCOUNTER — Emergency Department: Payer: Managed Care, Other (non HMO)

## 2020-10-19 ENCOUNTER — Other Ambulatory Visit: Payer: Self-pay

## 2020-10-19 DIAGNOSIS — R531 Weakness: Secondary | ICD-10-CM | POA: Diagnosis present

## 2020-10-19 DIAGNOSIS — F17210 Nicotine dependence, cigarettes, uncomplicated: Secondary | ICD-10-CM | POA: Diagnosis not present

## 2020-10-19 DIAGNOSIS — C3491 Malignant neoplasm of unspecified part of right bronchus or lung: Secondary | ICD-10-CM | POA: Insufficient documentation

## 2020-10-19 DIAGNOSIS — W19XXXA Unspecified fall, initial encounter: Secondary | ICD-10-CM | POA: Insufficient documentation

## 2020-10-19 DIAGNOSIS — Z85118 Personal history of other malignant neoplasm of bronchus and lung: Secondary | ICD-10-CM | POA: Diagnosis not present

## 2020-10-19 DIAGNOSIS — Z7951 Long term (current) use of inhaled steroids: Secondary | ICD-10-CM | POA: Insufficient documentation

## 2020-10-19 DIAGNOSIS — J449 Chronic obstructive pulmonary disease, unspecified: Secondary | ICD-10-CM | POA: Insufficient documentation

## 2020-10-19 DIAGNOSIS — T887XXA Unspecified adverse effect of drug or medicament, initial encounter: Secondary | ICD-10-CM | POA: Insufficient documentation

## 2020-10-19 DIAGNOSIS — T424X5A Adverse effect of benzodiazepines, initial encounter: Secondary | ICD-10-CM | POA: Insufficient documentation

## 2020-10-19 DIAGNOSIS — J45909 Unspecified asthma, uncomplicated: Secondary | ICD-10-CM | POA: Diagnosis not present

## 2020-10-19 LAB — BASIC METABOLIC PANEL
Anion gap: 11 (ref 5–15)
BUN: 23 mg/dL (ref 8–23)
CO2: 22 mmol/L (ref 22–32)
Calcium: 8.8 mg/dL — ABNORMAL LOW (ref 8.9–10.3)
Chloride: 102 mmol/L (ref 98–111)
Creatinine, Ser: 0.86 mg/dL (ref 0.61–1.24)
GFR, Estimated: >60 mL/min (ref >60)
Glucose, Bld: 98 mg/dL (ref 70–99)
Potassium: 5.6 mmol/L — ABNORMAL HIGH (ref 3.5–5.1)
Sodium: 135 mmol/L (ref 135–145)

## 2020-10-19 LAB — CBC
HCT: 40.9 % (ref 39.0–52.0)
Hemoglobin: 13.8 g/dL (ref 13.0–17.0)
MCH: 30.6 pg (ref 26.0–34.0)
MCHC: 33.7 g/dL (ref 30.0–36.0)
MCV: 90.7 fL (ref 80.0–100.0)
Platelets: 84 10*3/uL — ABNORMAL LOW (ref 150–400)
RBC: 4.51 MIL/uL (ref 4.22–5.81)
RDW: 14.6 % (ref 11.5–15.5)
WBC: 9.4 10*3/uL (ref 4.0–10.5)
nRBC: 2.2 % — ABNORMAL HIGH (ref 0.0–0.2)

## 2020-10-19 MED ORDER — SODIUM CHLORIDE 0.9 % IV SOLN
1000.0000 mL | Freq: Once | INTRAVENOUS | Status: AC
  Administered 2020-10-19: 1000 mL via INTRAVENOUS

## 2020-10-19 MED ORDER — OXYCODONE HCL 5 MG PO TABS
5.0000 mg | ORAL_TABLET | Freq: Once | ORAL | Status: AC
  Administered 2020-10-19: 5 mg via ORAL
  Filled 2020-10-19: qty 1

## 2020-10-19 NOTE — ED Notes (Signed)
Provided sprite and water per pt request.

## 2020-10-19 NOTE — ED Notes (Signed)
Here via EMS from home. Hx of Stage 4 lung CA with mets, falls x3 since 6am, pt feels weak, denies LOC with falls, c/o constipation for a few weeks, Ativan, oxy, and fentanyl on board this am. NSR occasional PVC's, 94%, 126/60. alert and oriented.

## 2020-10-19 NOTE — ED Notes (Signed)
Patient given urinal, advised of need for urine sample, states he does not need to void at this time.

## 2020-10-19 NOTE — ED Notes (Signed)
Pt to ED c/o 3 falls this morning. Pt was trying to get into front door. Pt states his legs have been getting weaker. Denies head trauma, LOC. Had taken double dose of oxycodone last night at 11pm and this morning at 0500 (10mg  instead of 5mg ) approved by HCP and had also taken ativan this morning. Pt has lung cancer with mets to bone. Dx 2/22. Implanted port last accessed 2wk ago.

## 2020-10-19 NOTE — ED Triage Notes (Signed)
Pt to ER via [facility] after having three falls today. Denies new pain, denies LOC or hitting head.   Reports his knees buckling then losing his balance and falling from standing. Reports the final time EMS were called to help him get up.   Pt reports starting in July he has been having muscle/ bone/ nerve pain all over. Pt with stage 4 small cell lung cancer. Currently receiving immunotherapy. Reports multiple pain medication changes including an increased dose of oxycodone that he started last night.

## 2020-10-20 ENCOUNTER — Inpatient Hospital Stay (HOSPITAL_BASED_OUTPATIENT_CLINIC_OR_DEPARTMENT_OTHER): Payer: Managed Care, Other (non HMO) | Admitting: Hospice and Palliative Medicine

## 2020-10-20 DIAGNOSIS — R531 Weakness: Secondary | ICD-10-CM

## 2020-10-20 DIAGNOSIS — C3491 Malignant neoplasm of unspecified part of right bronchus or lung: Secondary | ICD-10-CM | POA: Diagnosis not present

## 2020-10-20 NOTE — ED Provider Notes (Signed)
[facility] Emergency Department Provider Note   ____________________________________________    I have reviewed the triage vital signs and the nursing notes.   HISTORY  Chief Complaint Fall     HPI David Johnson is a 75 y.o. male who presents for a fall.  Patient reports that he felt like his knees gave way.  He believes this may be because he has a fentanyl patch on, took twice as much oxycodone as usual and then took a benzodiazepine in preparation for an MRI this morning.  After taking the benzo his knees felt weak when he was walking on the stairs and he needed help.  He is feeling better now.  Denies headache.  No nausea vomiting.  No fevers chills .  Occasional cough   Past Medical History:  Diagnosis Date   Adenomatous polyps    Asthma    Barrett's esophagus    Colon polyp    COPD (chronic obstructive pulmonary disease) ([place])    uses inhaler, "i may have COPD"   Dyspnea    Eye injury    GERD (gastroesophageal reflux disease)    History of gonorrhea    History of pneumonia    Lung cancer ([place])    Obesity    Pneumonia    Reflux esophagitis    Sleep apnea     Patient Active Problem List   Diagnosis Date Noted   Small cell lung cancer, right ([place]) 04/18/2020   Goals of care, counseling/discussion 04/08/2020   Eye injury 04/07/2020   History of gonorrhea 04/07/2020   History of pneumonia 04/07/2020   Barrett's esophagus 04/07/2020   Adenomatous polyps 04/07/2020   Acute exacerbation of chronic obstructive pulmonary disease (COPD) ([place]) 03/30/2020   Atypical chest pain 03/30/2020   Metastatic disease ([place]) 03/30/2020   Pleural effusion, right 03/30/2020   SOB (shortness of breath) 03/30/2020   PAD (peripheral artery disease) ([place]) 11/24/2017   GERD (gastroesophageal reflux disease) 11/24/2017   Polycythemia 06/26/2016   Obesity 06/03/2016   OSA (obstructive sleep apnea) 06/03/2016    Past Surgical History:  Procedure  Laterality Date   CATARACT EXTRACTION Left    COLONOSCOPY     COLONOSCOPY WITH PROPOFOL N/A 05/19/2015   Procedure: COLONOSCOPY WITH PROPOFOL;  Surgeon: [name], MD;  Location: [facility] ENDOSCOPY;  Service: Endoscopy;  Laterality: N/A;   COLONOSCOPY WITH PROPOFOL N/A 09/11/2018   Procedure: COLONOSCOPY WITH PROPOFOL;  Surgeon: [name], MD;  Location: [facility] ENDOSCOPY;  Service: Endoscopy;  Laterality: N/A;   ESOPHAGOGASTRODUODENOSCOPY (EGD) WITH PROPOFOL N/A 05/19/2015   Procedure: ESOPHAGOGASTRODUODENOSCOPY (EGD) WITH PROPOFOL;  Surgeon: [name], MD;  Location: [facility] ENDOSCOPY;  Service: Endoscopy;  Laterality: N/A;   ESOPHAGOGASTRODUODENOSCOPY (EGD) WITH PROPOFOL N/A 10/11/2016   Procedure: ESOPHAGOGASTRODUODENOSCOPY (EGD) WITH PROPOFOL;  Surgeon: [name], MD;  Location: [facility] ENDOSCOPY;  Service: Endoscopy;  Laterality: N/A;   ESOPHAGOGASTRODUODENOSCOPY (EGD) WITH PROPOFOL N/A 01/01/2018   Procedure: ESOPHAGOGASTRODUODENOSCOPY (EGD) WITH PROPOFOL;  Surgeon: [name], MD;  Location: [facility] ENDOSCOPY;  Service: Endoscopy;  Laterality: N/A;   ESOPHAGOGASTRODUODENOSCOPY (EGD) WITH PROPOFOL N/A 09/11/2018   Procedure: ESOPHAGOGASTRODUODENOSCOPY (EGD) WITH PROPOFOL;  Surgeon: [name], MD;  Location: [facility] ENDOSCOPY;  Service: Endoscopy;  Laterality: N/A;   EYE SURGERY     IR IMAGING GUIDED PORT INSERTION  04/21/2020    Prior to Admission medications   Medication Sig Start Date End Date Taking? Authorizing Provider  albuterol (PROVENTIL HFA;VENTOLIN HFA) 108 (90 Base) MCG/ACT  inhaler 2 puffs q.i.d. p.r.n. short of breath, wheezing, or cough 07/21/16   [provider]  fentaNYL (DURAGESIC) 25 MCG/HR Place 1 patch onto the skin every 3 (three) days. 10/18/20   Borders, Kirt Boys, NP  lactulose (CHRONULAC) 10 GM/15ML solution Take 15-30 mLs (10-20 g total) by mouth 3 (three) times daily as needed (constipation). 10/12/20   Borders, Kirt Boys, NP   levothyroxine (SYNTHROID) 150 MCG tablet Take 1 tablet (150 mcg total) by mouth daily before breakfast. 09/18/20 12/17/20  Sindy Guadeloupe, MD  lidocaine-prilocaine (EMLA) cream Apply to affected area once 08/07/20   Sindy Guadeloupe, MD  LORazepam (ATIVAN) 0.5 MG tablet Take 1 tablet (0.5 mg total) by mouth 3 (three) times daily as needed (Nausea or vomiting). 04/19/20   Sindy Guadeloupe, MD  Morphine Sulfate, Concentrate, 5 MG/0.25ML SOLN Take 5 mg by mouth every 4 (four) hours as needed. For dyspnea 09/18/20   Sindy Guadeloupe, MD  ondansetron (ZOFRAN) 8 MG tablet Take 1 tablet (8 mg total) by mouth 2 (two) times daily as needed for refractory nausea / vomiting. Start on day 3 after carboplatin chemo. 04/18/20   Sindy Guadeloupe, MD  oxyCODONE (OXY IR/ROXICODONE) 5 MG immediate release tablet Take 1-2 tablets (5-10 mg total) by mouth every 4 (four) hours as needed for severe pain. 10/18/20   Borders, Kirt Boys, NP  polyethylene glycol (MIRALAX) 17 g packet Take 17 g by mouth daily. 10/12/20   Borders, Kirt Boys, NP  prochlorperazine (COMPAZINE) 10 MG tablet Take 1 tablet (10 mg total) by mouth every 6 (six) hours as needed (Nausea or vomiting). 04/18/20   Sindy Guadeloupe, MD  senna-docusate (SENNA S) 8.6-50 MG tablet Take 1 tablet by mouth 2 (two) times daily as needed for mild constipation (constipation). 10/12/20   Borders, Kirt Boys, NP     Allergies Cephalexin  Family History  Problem Relation Age of Onset   Emphysema Mother     Social History Social History   Tobacco Use   Smoking status: Every Day    Packs/day: 1.50    Years: 35.00    Pack years: 52.50    Types: Cigarettes, Cigars    Last attempt to quit: 03/04/2015    Years since quitting: 5.6   Smokeless tobacco: Never  Vaping Use   Vaping Use: Former  Substance Use Topics   Alcohol use: Not Currently    Comment: none or 3 weeks    Drug use: No    Review of Systems  Constitutional: No fever/chills Eyes: No visual changes.  ENT: No  sore throat. Cardiovascular: Denies chest pain. Respiratory: Denies shortness of breath.  Occasional cough Gastrointestinal: No abdominal pain.  No nausea, no vomiting.   Genitourinary: Negative for dysuria. Musculoskeletal: Negative for back pain. Skin: Negative for rash. Neurological: No weakness, no headache, no numbness   ____________________________________________   PHYSICAL EXAM:  VITAL SIGNS: ED Triage Vitals  Enc Vitals Group     BP 10/19/20 1204 131/70     Pulse Rate 10/19/20 1204 84     Resp 10/19/20 1204 18     Temp 10/19/20 1204 98.4 F (36.9 C)     Temp Source 10/19/20 1204 Oral     SpO2 10/19/20 1204 93 %     Weight 10/19/20 1205 (!) 146.3 kg (322 lb 8 oz)     Height 10/19/20 1205 1.854 m (6\' 1" )     Head Circumference --      Peak Flow --  Pain Score 10/19/20 1205 0     Pain Loc --      Pain Edu? --      Excl. in [place]? --     Constitutional: Alert and oriented. No acute distress. Pleasant and interactive  Nose: No congestion/rhinnorhea. Mouth/Throat: Mucous membranes are moist.   Neck:  Painless ROM Cardiovascular: Normal rate, regular rhythm. Grossly normal heart sounds.  Good peripheral circulation. Respiratory: Normal respiratory effort.  No retractions. Lungs CTAB. Gastrointestinal: Soft and nontender. No distention.  No CVA tenderness. Genitourinary: deferred Musculoskeletal: No lower extremity tenderness nor edema.  Warm and well perfused Neurologic:  Normal speech and language. No gross focal neurologic deficits are appreciated.  Skin:  Skin is warm, dry and intact. No rash noted. Psychiatric: Mood and affect are normal. Speech and behavior are normal.  ____________________________________________   LABS (all labs ordered are listed, but only abnormal results are displayed)  Labs Reviewed  BASIC METABOLIC PANEL - Abnormal; Notable for the following components:      Result Value   Potassium 5.6 (*)    Calcium 8.8 (*)    All other  components within normal limits  CBC - Abnormal; Notable for the following components:   Platelets 84 (*)    nRBC 2.2 (*)    All other components within normal limits   ____________________________________________  EKG  ED ECG REPORT I, Lavonia Drafts, the attending physician, personally viewed and interpreted this ECG.  Date: 10/20/2020  Rhythm: normal sinus rhythm QRS Axis: normal Intervals: normal ST/T Wave abnormalities: normal Narrative Interpretation: no evidence of acute ischemia  ____________________________________________  RADIOLOGY Chest x-ray viewed by me, no acute abnormality ____________________________________________   PROCEDURES  Procedure(s) performed: No  Procedures   Critical Care performed: No ____________________________________________   INITIAL IMPRESSION / ASSESSMENT AND PLAN / ED COURSE  Pertinent labs & imaging results that were available during my care of the patient were reviewed by me and considered in my medical decision making (see chart for details).   Patient presents with weakness as detailed above.  I do suspect medication was the cause of his weakness.  Lab work today is overall reassuring, BUN/creatinine normal, mild elevation of potassium suspect related to hemolysis, will give IV fluids  Patient feel better after IV fluids, ambulating well.  Appropriate for discharge, encouraged him to use a walker and to decrease opioid use and to not mix with benzos    ____________________________________________   FINAL CLINICAL IMPRESSION(S) / ED DIAGNOSES  Final diagnoses:  Fall, initial encounter  Medication side effects        Note:  This document was prepared using Dragon voice recognition software and may include unintentional dictation errors.    Lavonia Drafts, MD 10/20/20 [phone]

## 2020-10-20 NOTE — Progress Notes (Signed)
Virtual Visit via Video Note  I connected with David Johnson on 10/20/20 at  1:00 PM EDT by a video enabled telemedicine application and verified that I am speaking with the correct person using two identifiers.  Location: Patient: Home Provider: Clinic   I discussed the limitations of evaluation and management by telemedicine and the availability of in person appointments. The patient expressed understanding and agreed to proceed.  History of Present Illness: David Johnson is a 75 y.o. 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  PMH is also notable for COPD and OSA requiring CPAP.  Palliative care was consulted help address goals manage ongoing symptoms.   Observations/Objective: I spoke with patient by phone.  Patient was seen in the ER yesterday for falls after his MRI.  Today, patient reports feeling better.  He says that he increased the dose of the oxycodone yesterday to 2 tablets (10 mg) as well as started taking lorazepam 2 tablets (1 mg) when he normally does not require lorazepam.  Patient attributes weakness and falls to medication dosing.  He says that he has stopped taking the lorazepam.  We discussed decreasing the dose of oxycodone back to 5 mg or he can take 1.5 tablets (7.5mg ) every 4 hours as needed.  Discussed constipation management.  Patient reports improved constipation.  No other symptoms or changes reported.  Assessment and Plan: Neoplasm related pain -continue transdermal fentanyl/oxycodone  Constipation -improved on daily bowel regimen  Weakness -referral for rehab screening   Follow Up Instructions: Follow-up MyChart visit 2-3 weeks   I discussed the assessment and treatment plan with the patient. The patient was provided an opportunity to ask questions and all were answered. The patient agreed with the plan and demonstrated an understanding of the  instructions.   The patient was advised to call back or seek an in-person evaluation if the symptoms worsen or if the condition fails to improve as anticipated.  I provided 10 minutes of non-face-to-face time during this encounter.   Irean Hong, NP

## 2020-10-24 ENCOUNTER — Other Ambulatory Visit: Payer: Self-pay | Admitting: *Deleted

## 2020-10-24 DIAGNOSIS — K769 Liver disease, unspecified: Secondary | ICD-10-CM

## 2020-10-24 DIAGNOSIS — C3490 Malignant neoplasm of unspecified part of unspecified bronchus or lung: Secondary | ICD-10-CM

## 2020-10-27 ENCOUNTER — Other Ambulatory Visit: Payer: Self-pay | Admitting: Oncology

## 2020-10-31 ENCOUNTER — Other Ambulatory Visit: Payer: Self-pay | Admitting: Radiology

## 2020-10-31 ENCOUNTER — Other Ambulatory Visit: Payer: Self-pay | Admitting: Oncology

## 2020-10-31 ENCOUNTER — Inpatient Hospital Stay: Payer: Managed Care, Other (non HMO) | Attending: Oncology

## 2020-10-31 ENCOUNTER — Inpatient Hospital Stay (HOSPITAL_BASED_OUTPATIENT_CLINIC_OR_DEPARTMENT_OTHER): Payer: Managed Care, Other (non HMO) | Admitting: Oncology

## 2020-10-31 ENCOUNTER — Other Ambulatory Visit: Payer: Self-pay

## 2020-10-31 ENCOUNTER — Other Ambulatory Visit: Payer: Self-pay | Admitting: *Deleted

## 2020-10-31 ENCOUNTER — Inpatient Hospital Stay: Payer: Managed Care, Other (non HMO)

## 2020-10-31 VITALS — Ht 73.0 in | Wt 306.9 lb

## 2020-10-31 DIAGNOSIS — C7951 Secondary malignant neoplasm of bone: Secondary | ICD-10-CM | POA: Insufficient documentation

## 2020-10-31 DIAGNOSIS — C787 Secondary malignant neoplasm of liver and intrahepatic bile duct: Secondary | ICD-10-CM | POA: Diagnosis not present

## 2020-10-31 DIAGNOSIS — Z7189 Other specified counseling: Secondary | ICD-10-CM | POA: Diagnosis not present

## 2020-10-31 DIAGNOSIS — E063 Autoimmune thyroiditis: Secondary | ICD-10-CM | POA: Insufficient documentation

## 2020-10-31 DIAGNOSIS — R945 Abnormal results of liver function studies: Secondary | ICD-10-CM

## 2020-10-31 DIAGNOSIS — K769 Liver disease, unspecified: Secondary | ICD-10-CM

## 2020-10-31 DIAGNOSIS — Z87891 Personal history of nicotine dependence: Secondary | ICD-10-CM | POA: Diagnosis not present

## 2020-10-31 DIAGNOSIS — R7989 Other specified abnormal findings of blood chemistry: Secondary | ICD-10-CM

## 2020-10-31 DIAGNOSIS — Z95828 Presence of other vascular implants and grafts: Secondary | ICD-10-CM

## 2020-10-31 DIAGNOSIS — C3411 Malignant neoplasm of upper lobe, right bronchus or lung: Secondary | ICD-10-CM | POA: Insufficient documentation

## 2020-10-31 DIAGNOSIS — C3490 Malignant neoplasm of unspecified part of unspecified bronchus or lung: Secondary | ICD-10-CM | POA: Diagnosis not present

## 2020-10-31 DIAGNOSIS — C3491 Malignant neoplasm of unspecified part of right bronchus or lung: Secondary | ICD-10-CM

## 2020-10-31 DIAGNOSIS — D696 Thrombocytopenia, unspecified: Secondary | ICD-10-CM

## 2020-10-31 LAB — CBC WITH DIFFERENTIAL/PLATELET
Abs Immature Granulocytes: 1.18 10*3/uL — ABNORMAL HIGH (ref 0.00–0.07)
Basophils Absolute: 0 10*3/uL (ref 0.0–0.1)
Basophils Relative: 0 %
Eosinophils Absolute: 0 10*3/uL (ref 0.0–0.5)
Eosinophils Relative: 0 %
HCT: 39.4 % (ref 39.0–52.0)
Hemoglobin: 12.6 g/dL — ABNORMAL LOW (ref 13.0–17.0)
Immature Granulocytes: 15 %
Lymphocytes Relative: 26 %
Lymphs Abs: 2.1 10*3/uL (ref 0.7–4.0)
MCH: 29.8 pg (ref 26.0–34.0)
MCHC: 32 g/dL (ref 30.0–36.0)
MCV: 93.1 fL (ref 80.0–100.0)
Monocytes Absolute: 0.5 10*3/uL (ref 0.1–1.0)
Monocytes Relative: 7 %
Neutro Abs: 4.2 10*3/uL (ref 1.7–7.7)
Neutrophils Relative %: 52 %
Platelets: 27 10*3/uL — CL (ref 150–400)
RBC: 4.23 MIL/uL (ref 4.22–5.81)
RDW: 15.3 % (ref 11.5–15.5)
Smear Review: NORMAL
WBC: 8.1 10*3/uL (ref 4.0–10.5)
nRBC: 6.7 % — ABNORMAL HIGH (ref 0.0–0.2)

## 2020-10-31 LAB — PROTIME-INR
INR: 1.1 (ref 0.8–1.2)
Prothrombin Time: 14.6 seconds (ref 11.4–15.2)

## 2020-10-31 LAB — IMMATURE PLATELET FRACTION: Immature Platelet Fraction: 14 % — ABNORMAL HIGH (ref 1.2–8.6)

## 2020-10-31 LAB — LACTATE DEHYDROGENASE: LDH: 1408 U/L — ABNORMAL HIGH (ref 98–192)

## 2020-10-31 LAB — COMPREHENSIVE METABOLIC PANEL
ALT: 125 U/L — ABNORMAL HIGH (ref 0–44)
AST: 177 U/L — ABNORMAL HIGH (ref 15–41)
Albumin: 2.6 g/dL — ABNORMAL LOW (ref 3.5–5.0)
Alkaline Phosphatase: 383 U/L — ABNORMAL HIGH (ref 38–126)
Anion gap: 12 (ref 5–15)
BUN: 21 mg/dL (ref 8–23)
CO2: 25 mmol/L (ref 22–32)
Calcium: 9.2 mg/dL (ref 8.9–10.3)
Chloride: 99 mmol/L (ref 98–111)
Creatinine, Ser: 0.7 mg/dL (ref 0.61–1.24)
GFR, Estimated: >60 mL/min (ref >60)
Glucose, Bld: 99 mg/dL (ref 70–99)
Potassium: 4.3 mmol/L (ref 3.5–5.1)
Sodium: 136 mmol/L (ref 135–145)
Total Bilirubin: 1.9 mg/dL — ABNORMAL HIGH (ref 0.3–1.2)
Total Protein: 6.4 g/dL — ABNORMAL LOW (ref 6.5–8.1)

## 2020-10-31 LAB — APTT: aPTT: 35 seconds (ref 24–36)

## 2020-10-31 LAB — TSH: TSH: 23.84 u[IU]/mL — ABNORMAL HIGH (ref 0.350–4.500)

## 2020-10-31 LAB — CK: Total CK: 562 U/L — ABNORMAL HIGH (ref 49–397)

## 2020-10-31 LAB — T4, FREE: Free T4: 1.25 ng/dL — ABNORMAL HIGH (ref 0.61–1.12)

## 2020-10-31 MED ORDER — OXYCODONE HCL 20 MG PO TABS: 1.0000 | ORAL_TABLET | ORAL | 0 refills | Status: AC | PRN

## 2020-10-31 MED ORDER — LEVOTHYROXINE SODIUM 25 MCG PO TABS: 25.0000 ug | ORAL_TABLET | Freq: Every day | ORAL | 1 refills | Status: DC

## 2020-10-31 MED ORDER — PANTOPRAZOLE SODIUM 20 MG PO TBEC: 20.0000 mg | DELAYED_RELEASE_TABLET | Freq: Every day | ORAL | 0 refills | Status: DC

## 2020-10-31 MED ORDER — SODIUM CHLORIDE 0.9% FLUSH
10.0000 mL | Freq: Once | INTRAVENOUS | Status: AC
  Administered 2020-10-31: 10 mL via INTRAVENOUS
  Filled 2020-10-31: qty 10

## 2020-10-31 MED ORDER — HEPARIN SOD (PORK) LOCK FLUSH 100 UNIT/ML IV SOLN
500.0000 [IU] | Freq: Once | INTRAVENOUS | Status: DC
  Filled 2020-10-31: qty 5

## 2020-10-31 MED ORDER — HEPARIN SOD (PORK) LOCK FLUSH 100 UNIT/ML IV SOLN
INTRAVENOUS | Status: AC
  Filled 2020-10-31: qty 5

## 2020-10-31 MED ORDER — PREDNISONE 10 MG PO TABS: 10.0000 mg | ORAL_TABLET | ORAL | 0 refills | Status: AC

## 2020-10-31 NOTE — Progress Notes (Signed)
Pt is requesting possibly increasing opiod patch d/t increased pain level. Wt loss and constipation d/t decreased appetite and taste change. Reports 3 "controlled" falls in 4 hour time frame. States he slid d/t weakness. Pt c/o difficulty sleeping and says he uses "otc cough medicine from [facility]" for about 1.5 years. Unable to recall name of medication

## 2020-10-31 NOTE — Progress Notes (Signed)
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  Past medical history significant for COPD, GERD and obstructive sleep apnea.  CT angio chest abdomen pelvis showed 6.4 x 3.7 cm right paratracheal adenopathy concerning for malignancy.  1.9 cm right hilar node.  12 mm anterior mediastinal lymph node.  2.8 cm right thyroid nodule.  Several pleural-based masses concerning for metastatic disease.  Large irregular opacity in the right upper lobe which may be malignancy versus postobstructive atelectasis.  Lytic lesion noted in the manubrium consistent with metastatic disease.  Moderate right-sided pleural effusion s/p thoracentesis.    Cytology showedPoorly differentiated carcinoma with neuroendocrine differentiation.  Tumor cells were positive for pancytokeratin and CD56 but negative for TTF-1 p40 and synaptophysin.  Chromogranin stain was also negative.   MRI brain was negative for metastatic disease   Patient received 4 cycles of carboplatin etoposide and Tecentriq until May 2022 and following that he has been on maintenance Tecentriq  Interval history-patient has been feeling poorly for the last 2 months but more so in the last 2 weeks.  Reports worsening body aches and myalgias.  He had a couple of falls at home and during one of his falls he is not able to get up and had to call 911  and go to the ER.  Reports that his pain medications are not controlling his pain well.  He has a bowel movement every 4 to 6 days  ECOG PS- 2-3 Pain scale- 6 Opioid associated constipation- yes  Review of systems- Review of Systems  Constitutional:  Positive for malaise/fatigue. Negative for chills, fever and weight loss.  HENT:  Negative for congestion, ear discharge and nosebleeds.   Eyes:  Negative for blurred vision.  Respiratory:  Negative for cough, hemoptysis, sputum production, shortness of breath and wheezing.   Cardiovascular:  Negative for chest pain, palpitations, orthopnea and claudication.  Gastrointestinal:  Negative for abdominal pain, blood in stool, constipation, diarrhea, heartburn, melena, nausea and vomiting.  Genitourinary:  Negative for dysuria, flank pain, frequency, hematuria and urgency.  Musculoskeletal:  Positive for joint pain and myalgias. Negative for back pain.  Skin:  Negative for rash.  Neurological:  Negative for dizziness, tingling, focal weakness, seizures, weakness and headaches.  Endo/Heme/Allergies:  Does not bruise/bleed easily.  Psychiatric/Behavioral:  Negative for depression and suicidal ideas. The patient does not have insomnia.       Allergies  Allergen Reactions   Cephalexin Rash     Past Medical History:  Diagnosis Date   Adenomatous polyps    Asthma    Barrett's esophagus    Colon polyp    COPD (chronic obstructive pulmonary disease) ([place])    uses inhaler, "i may have COPD"   Dyspnea    Eye injury    GERD (gastroesophageal reflux disease)    History of gonorrhea    History of pneumonia    Lung cancer ([place])  Obesity    Pneumonia    Reflux esophagitis    Sleep apnea      Past Surgical History:  Procedure Laterality Date   CATARACT EXTRACTION Left    COLONOSCOPY     COLONOSCOPY WITH PROPOFOL N/A 05/19/2015   Procedure: COLONOSCOPY WITH PROPOFOL;  Surgeon: [name], MD;  Location: [facility] ENDOSCOPY;  Service:  Endoscopy;  Laterality: N/A;   COLONOSCOPY WITH PROPOFOL N/A 09/11/2018   Procedure: COLONOSCOPY WITH PROPOFOL;  Surgeon: [name], MD;  Location: [facility] ENDOSCOPY;  Service: Endoscopy;  Laterality: N/A;   ESOPHAGOGASTRODUODENOSCOPY (EGD) WITH PROPOFOL N/A 05/19/2015   Procedure: ESOPHAGOGASTRODUODENOSCOPY (EGD) WITH PROPOFOL;  Surgeon: [name], MD;  Location: [facility] ENDOSCOPY;  Service: Endoscopy;  Laterality: N/A;   ESOPHAGOGASTRODUODENOSCOPY (EGD) WITH PROPOFOL N/A 10/11/2016   Procedure: ESOPHAGOGASTRODUODENOSCOPY (EGD) WITH PROPOFOL;  Surgeon: [name], MD;  Location: [facility] ENDOSCOPY;  Service: Endoscopy;  Laterality: N/A;   ESOPHAGOGASTRODUODENOSCOPY (EGD) WITH PROPOFOL N/A 01/01/2018   Procedure: ESOPHAGOGASTRODUODENOSCOPY (EGD) WITH PROPOFOL;  Surgeon: [name], MD;  Location: [facility] ENDOSCOPY;  Service: Endoscopy;  Laterality: N/A;   ESOPHAGOGASTRODUODENOSCOPY (EGD) WITH PROPOFOL N/A 09/11/2018   Procedure: ESOPHAGOGASTRODUODENOSCOPY (EGD) WITH PROPOFOL;  Surgeon: [name], MD;  Location: [facility] ENDOSCOPY;  Service: Endoscopy;  Laterality: N/A;   EYE SURGERY     IR IMAGING GUIDED PORT INSERTION  04/21/2020    Social History   Socioeconomic History   Marital status: Married    Spouse name: Not on file   Number of children: Not on file   Years of education: Not on file   Highest education level: Not on file  Occupational History   Not on file  Tobacco Use   Smoking status: Every Day    Packs/day: 1.50    Years: 35.00    Pack years: 52.50    Types: Cigarettes, Cigars    Last attempt to quit: 03/04/2015    Years since quitting: 5.6   Smokeless tobacco: Never  Vaping Use   Vaping Use: Former  Substance and Sexual Activity   Alcohol use: Not Currently    Comment: none or 3 weeks    Drug use: No   Sexual activity: Not on file  Other Topics Concern   Not on file  Social History Narrative   Not on file   Social Determinants of Health    Financial Resource Strain: Not on file  Food Insecurity: Not on file  Transportation Needs: Not on file  Physical Activity: Not on file  Stress: Not on file  Social Connections: Not on file  Intimate Partner Violence: Not on file    Family History  Problem Relation Age of Onset   Emphysema Mother      Current Outpatient Medications:    albuterol (PROVENTIL HFA;VENTOLIN HFA) 108 (90 Base) MCG/ACT inhaler, 2 puffs q.i.d. p.r.n. short of breath, wheezing, or cough, Disp: , Rfl:    fentaNYL (DURAGESIC) 25 MCG/HR, Place 1 patch onto the skin every 3 (three) days., Disp: 10 patch, Rfl: 0   levothyroxine (SYNTHROID) 150 MCG tablet, Take 1 tablet (150 mcg total) by mouth daily before breakfast., Disp: 30 tablet, Rfl: 2   lidocaine-prilocaine (EMLA) cream, Apply to affected area once, Disp: 30 g, Rfl: 3   predniSONE (DELTASONE) 10 MG tablet, Take 1 tablet (10 mg total) by mouth as directed. 4 tablets daily with food for 4 days, Disp: 16 tablet, Rfl: 0   lactulose (CHRONULAC) 10 GM/15ML solution, Take 15-30 mLs (10-20 g  total) by mouth 3 (three) times daily as needed (constipation). (Patient not taking: Reported on 10/31/2020), Disp: 236 mL, Rfl: 0   levothyroxine (SYNTHROID) 25 MCG tablet, TAKE 1 TABLET BY MOUTH BEFORE BREAKFAST ADD TO 150MCG TO MAKE 175MCG ALTOGETHER, Disp: 90 tablet, Rfl: 0   LORazepam (ATIVAN) 0.5 MG tablet, Take 1 tablet (0.5 mg total) by mouth 3 (three) times daily as needed (Nausea or vomiting). (Patient not taking: Reported on 10/31/2020), Disp: 30 tablet, Rfl: 0   ondansetron (ZOFRAN) 8 MG tablet, Take 1 tablet (8 mg total) by mouth 2 (two) times daily as needed for refractory nausea / vomiting. Start on day 3 after carboplatin chemo. (Patient not taking: Reported on 10/31/2020), Disp: 30 tablet, Rfl: 1   Oxycodone HCl 20 MG TABS, Take 1 tablet (20 mg total) by mouth every 4 (four) hours as needed (for cancer pain)., Disp: 180 tablet, Rfl: 0   pantoprazole (PROTONIX) 20 MG  tablet, TAKE 1 TABLET(20 MG) BY MOUTH DAILY, Disp: 90 tablet, Rfl: 0   polyethylene glycol (MIRALAX) 17 g packet, Take 17 g by mouth daily. (Patient not taking: Reported on 10/31/2020), Disp: 30 each, Rfl: 2   prochlorperazine (COMPAZINE) 10 MG tablet, Take 1 tablet (10 mg total) by mouth every 6 (six) hours as needed (Nausea or vomiting). (Patient not taking: Reported on 10/31/2020), Disp: 30 tablet, Rfl: 1   senna-docusate (SENNA S) 8.6-50 MG tablet, Take 1 tablet by mouth 2 (two) times daily as needed for mild constipation (constipation). (Patient not taking: Reported on 10/31/2020), Disp: 90 tablet, Rfl: 3 No current facility-administered medications for this visit.  Facility-Administered Medications Ordered in Other Visits:    heparin lock flush 100 UNIT/ML injection, , , ,    heparin lock flush 100 unit/mL, 500 Units, Intravenous, Once, Sindy Guadeloupe, MD  Physical exam:  Vitals:   10/31/20 0939  Weight: (!) 306 lb 14.4 oz (139.2 kg)  Height: '6\' 1"'  (1.854 m)   Physical Exam Constitutional:      Comments: Sitting in a wheelchair.  Appears fatigued  Cardiovascular:     Rate and Rhythm: Normal rate and regular rhythm.     Heart sounds: Normal heart sounds.  Pulmonary:     Effort: Pulmonary effort is normal.     Breath sounds: Normal breath sounds.  Abdominal:     General: Bowel sounds are normal.     Palpations: Abdomen is soft.  Skin:    General: Skin is warm and dry.  Neurological:     Mental Status: He is alert and oriented to person, place, and time.     CMP Latest Ref Rng & Units 10/31/2020  Glucose 70 - 99 mg/dL 99  BUN 8 - 23 mg/dL 21  Creatinine 0.61 - 1.24 mg/dL 0.70  Sodium 135 - 145 mmol/L 136  Potassium 3.5 - 5.1 mmol/L 4.3  Chloride 98 - 111 mmol/L 99  CO2 22 - 32 mmol/L 25  Calcium 8.9 - 10.3 mg/dL 9.2  Total Protein 6.5 - 8.1 g/dL 6.4(L)  Total Bilirubin 0.3 - 1.2 mg/dL 1.9(H)  Alkaline Phos 38 - 126 U/L 383(H)  AST 15 - 41 U/L 177(H)  ALT 0 - 44 U/L 125(H)    CBC Latest Ref Rng & Units 10/31/2020  WBC 4.0 - 10.5 K/uL 8.1  Hemoglobin 13.0 - 17.0 g/dL 12.6(L)  Hematocrit 39.0 - 52.0 % 39.4  Platelets 150 - 400 K/uL 27(LL)    No images are attached to the encounter.  MR Abdomen  Wo Contrast  Result Date: 10/20/2020 CLINICAL DATA:  75 year old male with history of metastatic lung cancer. Indeterminate liver lesions noted on prior CT examination. Follow-up study. EXAM: MRI ABDOMEN WITHOUT CONTRAST TECHNIQUE: Multiplanar multisequence MR imaging was performed without the administration of intravenous contrast. COMPARISON:  No prior abdominal MRI. CT of the chest, abdomen and pelvis 09/26/2020. FINDINGS: Comment: Study is severely limited for detection and characterization of visceral and/or vascular lesions by lack of IV gadolinium. Lower chest: Small right pleural effusion lying dependently. Hepatobiliary: Assessment for hepatic metastasis is severely limited in the absence of IV gadolinium. With these limitations in mind, there are numerous T1 hypointense, T2 hyperintense lesions which demonstrate diffusion restriction, highly concerning for metastatic disease to the liver. The largest of these is in the left lobe of the liver between segmentsr 2 and 3 (axial image 15 of series 9 and coronal image 37 of series 2) measuring 2.9 x 1.6 x 2.6 cm. No intra or extrahepatic biliary ductal dilatation. Gallbladder is moderately distended. Gallbladder wall thickness is normal. No pericholecystic fluid. Small filling defects are noted in the dependent portion of the gallbladder, compatible with gallstones. Pancreas: No definite pancreatic mass or peripancreatic fluid collections or inflammatory changes noted on today's noncontrast examination. No pancreatic ductal dilatation. Spleen:  Unremarkable. Adrenals/Urinary Tract: Small T1 hypointense and T2 hyperintense lesions in the kidneys bilaterally, incompletely characterized on today's noncontrast examination, but likely to  represent cysts, largest of which is in the medial aspect of the lower pole of the left kidney measuring 1.2 cm in diameter. No hydroureteronephrosis in the visualized portions of the abdomen. Bilateral adrenal glands are normal in appearance. Stomach/Bowel: Visualized portions are unremarkable. Vascular/Lymphatic: No aneurysm identified in the visualized abdominal vasculature. No lymphadenopathy noted in the abdomen. Other: No significant volume of ascites noted in the visualized portions of the peritoneal cavity. Musculoskeletal: Widespread areas of diffusion restriction are noted in the visualized axial skeleton, suggestive of metastatic disease to the bones. IMPRESSION: 1. Despite the limitations of today's noncontrast examination, there are numerous diffusion restricting lesions scattered throughout the liver, strongly suspicious for metastatic disease. 2. Widespread metastatic disease to the bones again noted. 3. Small right pleural effusion lying dependently. 4. Cholelithiasis without evidence of acute cholecystitis at this time. Electronically Signed   By: Vinnie Langton M.D.   On: 10/20/2020 08:28   NM Bone Scan Whole Body  Result Date: 10/03/2020 CLINICAL DATA:  Small cell lung cancer, low back pain radiating to sternum and down both arms for 6 months worsened over time EXAM: NUCLEAR MEDICINE WHOLE BODY BONE SCAN TECHNIQUE: Whole body anterior and posterior images were obtained approximately 3 hours after intravenous injection of radiopharmaceutical. RADIOPHARMACEUTICALS:  20.53 mCi Technetium-16mMDP IV COMPARISON:  None Correlation: CT chest abdomen pelvis 10/02/2020, PET-CT 04/11/2020 FINDINGS: Abnormal tracer uptake within manubrium corresponding to destructive bone lesion on CT consistent with metastasis. Uptake at shoulders, sternoclavicular joints, typically degenerative. Questionable foci of abnormal tracer uptake at posterior LEFT eighth rib and posterolateral LEFT ninth rib concerning for  metastases. Subtle foci of increased tracer uptake at the distal femoral metaphysis bilaterally and at LEFT iliac bone laterally concerning for metastases. Expected urinary tract and soft tissue distribution of tracer. IMPRESSION: Metastatic focus involving manubrium. Concern for subtle metastases at distal femoral metaphyses bilaterally and LEFT iliac bone. Questionable sites of osseous metastatic disease involving RIGHT eighth and ninth ribs. Electronically Signed   By: MLavonia DanaM.D.   On: 10/03/2020 08:39   CT CHEST ABDOMEN  PELVIS W CONTRAST  Result Date: 10/02/2020 CLINICAL DATA:  Metastatic small cell lung cancer. Chemotherapy ongoing. EXAM: CT CHEST, ABDOMEN, AND PELVIS WITH CONTRAST TECHNIQUE: Multidetector CT imaging of the chest, abdomen and pelvis was performed following the standard protocol during bolus administration of intravenous contrast. CONTRAST:  117m OMNIPAQUE IOHEXOL 350 MG/ML SOLN COMPARISON:  CT 06/22/2020 FINDINGS: CT CHEST FINDINGS Cardiovascular: No significant vascular findings. Normal heart size. No pericardial effusion. Port in the anterior chest wall with tip in distal SVC. Mediastinum/Nodes: RIGHT lower paratracheal ill-defined nodal mass measures 15 mm short axis (image 27/series 2) unchanged from 15 mm. No new mediastinal adenopathy. High RIGHT paratracheal node measures 5 mm decreased from 6 mm. Lungs/Pleura: Posterior nodule in the RIGHT upper lobe measures 16 mm (image 64/4) compared to 18 mm. No new pulmonary nodularity. Moderate size RIGHT pleural effusion is slightly decreased in volume. Musculoskeletal: Aggressive lesion in the manubrium with destruction of the posterior cortex measures 23 mm compared with 25 mm for no interval change. CT ABDOMEN AND PELVIS FINDINGS Hepatobiliary: Subtle hypodense lesion in the inferior RIGHT hepatic lobe measures approximately 8 mm (image 85/series 2). Similar adjacent poorly defined hypodense lesion on image 86 in the RIGHT hepatic  lobe. 12 mm superior RIGHT hepatic lobe lesion on image 61/2. These subtle lesions are not identified on comparison CT. Gallstones again noted. Pancreas: Pancreas is normal. No ductal dilatation. No pancreatic inflammation. Spleen: Normal spleen Adrenals/urinary tract: Adrenal glands and kidneys are normal. The ureters and bladder normal. Stomach/Bowel: Stomach, small bowel, appendix, and cecum are normal. The colon and rectosigmoid colon are normal. Vascular/Lymphatic: Abdominal aorta is normal caliber. There is no retroperitoneal or periportal lymphadenopathy. No pelvic lymphadenopathy. Reproductive: Unremarkable Other: No peritoneal disease Musculoskeletal: Subtle sclerotic lesion in the LEFT iliac bone is unchanged (image 109/series 2. Round sclerotic lesion in the T11 vertebral body (image 58/2) more conspicuous than comparison exam. IMPRESSION: Chest Impression: 1. Stable treated nodule in RIGHT upper lobe. 2. Stable small mediastinal lymph nodes. 3. Persistent RIGHT pleural effusion. 4. No evidence of disease progression in the thorax. Abdomen / Pelvis Impression: 1. Several new subtle hypodense lesions in the RIGHT hepatic lobe are a concerning finding and warrant further investigation with contrast hepatic MRI. 2. Round sclerotic lesion in the T11 vertebral body is more conspicuous which could represent a positive treatment response. Stable lytic lesion in the manubrium. These results will be called to the ordering clinician or representative by the Radiologist Assistant, and communication documented in the PACS or [employer] Electronically Signed   By: SSuzy BouchardM.D.   On: 10/02/2020 16:49   DG Chest Port 1 View  Result Date: 10/19/2020 CLINICAL DATA:  Weakness, cough.  Falls. EXAM: PORTABLE CHEST 1 VIEW COMPARISON:  04/21/2020 and CT chest 10/02/2020. FINDINGS: Right IJ Port-A-Cath tip is in the SVC. Heart is enlarged. Mild basilar predominant interstitial prominence and indistinctness.  There may be small bilateral pleural effusions. IMPRESSION: Suspect mild congestive heart failure. Electronically Signed   By: MLorin PicketM.D.   On: 10/19/2020 14:28   ECHOCARDIOGRAM COMPLETE  Result Date: 10/04/2020    ECHOCARDIOGRAM REPORT   Patient Name:   AABANOUB HANKENDate of Exam: 10/04/2020 Medical Rec #:  [id number]     Height:       73.0 in Accession #:    [id number]    Weight:       326.4 lb Date of Birth:  [birth year]     BSA:  2.650 m Patient Age:    75 years       BP:           129/68 mmHg Patient Gender: M              HR:           76 bpm. Exam Location:  [facility] Procedure: 2D Echo, Color Doppler, Cardiac Doppler and Intracardiac            Opacification Agent Indications:     R07.9 Chest Pain  History:         Patient has no prior history of Echocardiogram examinations.                  COPD; Risk Factors:Sleep Apnea. Asthma.  Sonographer:     Charmayne Sheer RDCS (AE) Referring Phys:  [id number] Weston Anna Shonia Skilling Diagnosing Phys: Kate Sable MD  Sonographer Comments: Technically difficult study due to poor echo windows. Image acquisition challenging due to patient body habitus and Image acquisition challenging due to COPD. IMPRESSIONS  1. Left ventricular ejection fraction, by estimation, is 55 to 60%. The left ventricle has normal function. The left ventricle has no regional wall motion abnormalities. Left ventricular diastolic parameters were normal.  2. Right ventricular systolic function is normal. The right ventricular size is normal.  3. The mitral valve is normal in structure. No evidence of mitral valve regurgitation.  4. The aortic valve is normal in structure. Aortic valve regurgitation is not visualized. FINDINGS  Left Ventricle: Left ventricular ejection fraction, by estimation, is 55 to 60%. The left ventricle has normal function. The left ventricle has no regional wall motion abnormalities. Definity contrast agent was given IV to delineate the left ventricular  endocardial  borders. The left ventricular internal cavity size was normal in size. There is no left ventricular hypertrophy. Left ventricular diastolic parameters were normal. Right Ventricle: The right ventricular size is normal. No increase in right ventricular wall thickness. Right ventricular systolic function is normal. Left Atrium: Left atrial size was normal in size. Right Atrium: Right atrial size was normal in size. Pericardium: There is no evidence of pericardial effusion. Mitral Valve: The mitral valve is normal in structure. No evidence of mitral valve regurgitation. MV peak gradient, 2.0 mmHg. The mean mitral valve gradient is 1.0 mmHg. Tricuspid Valve: The tricuspid valve is not well visualized. Tricuspid valve regurgitation is not demonstrated. Aortic Valve: The aortic valve is normal in structure. Aortic valve regurgitation is not visualized. Pulmonic Valve: The pulmonic valve was normal in structure. Pulmonic valve regurgitation is not visualized. Aorta: The aortic root is normal in size and structure. Venous: The inferior vena cava was not well visualized. IAS/Shunts: No atrial level shunt detected by color flow Doppler.  LEFT VENTRICLE PLAX 2D LVIDd:         5.60 cm  Diastology LVIDs:         4.00 cm  LV e' medial:    7.18 cm/s LV PW:         1.20 cm  LV E/e' medial:  8.5 LV IVS:        1.10 cm  LV e' lateral:   7.94 cm/s LVOT diam:     2.20 cm  LV E/e' lateral: 7.7 LVOT Area:     3.80 cm  RIGHT VENTRICLE RV Basal diam:  2.80 cm LEFT ATRIUM           Index LA diam:      3.40 cm 1.28  cm/m LA Vol (A4C): 87.1 ml 32.87 ml/m                        PULMONIC VALVE AORTA                 PV Vmax:       0.78 m/s Ao Root diam: 3.30 cm PV Vmean:      48.100 cm/s                       PV VTI:        0.119 m                       PV Peak grad:  2.4 mmHg                       PV Mean grad:  1.0 mmHg  MITRAL VALVE MV Area (PHT): 2.52 cm    SHUNTS MV Peak grad:  2.0 mmHg    Systemic Diam: 2.20 cm MV Mean grad:  1.0 mmHg  MV Vmax:       0.72 m/s MV Vmean:      46.1 cm/s MV Decel Time: 301 msec MV E velocity: 60.80 cm/s MV A velocity: 66.80 cm/s MV E/A ratio:  0.91 Kate Sable MD Electronically signed by Kate Sable MD Signature Date/Time: 10/04/2020/7:11:46 PM    Final      Assessment and plan- Patient is a 75 y.o. male with extensive stage small cell lung cancer s/p 4 cycles of carbo etoposide here for following issues:  Severe thrombocytopenia: Patient was in the ER on 10/19/2020 after he had a fall and a platelet count checked on that day was 84.  It is further lower today at 27 today.  Patient denies any bleeding.  No recent chemotherapy and patient has only been on Tecentriq.  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  He does have an increase in nucleated red blood cells noted on his differential.  We did check an immature platelet fraction today which is elevated at 14%. My plan is to give him a trial of 4 days of Decadron 40 mg.  We will also bring him back in 2 days on 11/02/2020 to receive 2 doses of IVIG 1 g/kg for possible ITP.  If he does not have any significant improvement in his platelet count-depending on his overall goals we may have to proceed with a bone marrow biopsy to a certain the cause of thrombocytopenia before attempting second line treatments for ITP.  Fall precautions also reviewed with the patient.  2.  Patient will not be receiving Tecentriq today.  His recent MRI showed concerning liver lesions and plan was to get a liver biopsy.  That will be kept on hold until thrombocytopenia improves.  Patient has abnormal LFTs today withElevated AST and ALT as well as total bilirubin of 1.9.  LDH is elevated at 1408 all of which is concerning for further disease progression.  We discussed that his overall prognosis remains poor likely about 3 months.  Unless  his thrombocytopenia improves we would not be in a position to consider second line treatment with lurbinectedin.  I have encouraged the patient to discuss further goals of care with his family.  I will also have him see NP Altha Harm from palliative care  in 2 days to discuss his CODE STATUS as well.  If patient decides not to pursue any further aggressive treatment home hospice remains a reasonable option at this time.  3.  Neoplasm related pain: We will increase oxycodone to 20 mg every 4 hours as needed.  Based on his pain control in 2 days we will consider going up on fentanyl as well.  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   Visit Diagnosis 1. Thrombocytopenia ([place])   2. Small cell lung cancer ([place])   3. Goals of care, counseling/discussion   4. Abnormal LFTs   5. Autoimmune hypothyroidism      Dr. Randa Evens, MD, MPH [facility] [phone] 10/31/2020 12:59 PM

## 2020-11-02 ENCOUNTER — Other Ambulatory Visit: Payer: Self-pay | Admitting: *Deleted

## 2020-11-02 ENCOUNTER — Inpatient Hospital Stay: Payer: Managed Care, Other (non HMO)

## 2020-11-02 ENCOUNTER — Inpatient Hospital Stay (HOSPITAL_BASED_OUTPATIENT_CLINIC_OR_DEPARTMENT_OTHER): Payer: Managed Care, Other (non HMO) | Admitting: Hospice and Palliative Medicine

## 2020-11-02 ENCOUNTER — Ambulatory Visit: Payer: Managed Care, Other (non HMO)

## 2020-11-02 VITALS — Temp 98.6°F

## 2020-11-02 DIAGNOSIS — K769 Liver disease, unspecified: Secondary | ICD-10-CM

## 2020-11-02 DIAGNOSIS — C3491 Malignant neoplasm of unspecified part of right bronchus or lung: Secondary | ICD-10-CM

## 2020-11-02 DIAGNOSIS — Z515 Encounter for palliative care: Secondary | ICD-10-CM | POA: Diagnosis not present

## 2020-11-02 DIAGNOSIS — G893 Neoplasm related pain (acute) (chronic): Secondary | ICD-10-CM

## 2020-11-02 DIAGNOSIS — D696 Thrombocytopenia, unspecified: Secondary | ICD-10-CM

## 2020-11-02 DIAGNOSIS — C3411 Malignant neoplasm of upper lobe, right bronchus or lung: Secondary | ICD-10-CM | POA: Diagnosis not present

## 2020-11-02 LAB — CBC WITH DIFFERENTIAL/PLATELET
Abs Immature Granulocytes: 0.83 10*3/uL — ABNORMAL HIGH (ref 0.00–0.07)
Basophils Absolute: 0.1 10*3/uL (ref 0.0–0.1)
Basophils Relative: 1 %
Eosinophils Absolute: 0 10*3/uL (ref 0.0–0.5)
Eosinophils Relative: 0 %
HCT: 37.8 % — ABNORMAL LOW (ref 39.0–52.0)
Hemoglobin: 12.3 g/dL — ABNORMAL LOW (ref 13.0–17.0)
Immature Granulocytes: 11 %
Lymphocytes Relative: 34 %
Lymphs Abs: 2.6 10*3/uL (ref 0.7–4.0)
MCH: 30.2 pg (ref 26.0–34.0)
MCHC: 32.5 g/dL (ref 30.0–36.0)
MCV: 92.9 fL (ref 80.0–100.0)
Monocytes Absolute: 0.5 10*3/uL (ref 0.1–1.0)
Monocytes Relative: 6 %
Neutro Abs: 3.7 10*3/uL (ref 1.7–7.7)
Neutrophils Relative %: 48 %
Platelets: 23 10*3/uL — CL (ref 150–400)
RBC: 4.07 MIL/uL — ABNORMAL LOW (ref 4.22–5.81)
RDW: 15.4 % (ref 11.5–15.5)
Smear Review: NORMAL
WBC: 7.7 10*3/uL (ref 4.0–10.5)
nRBC: 9.2 % — ABNORMAL HIGH (ref 0.0–0.2)

## 2020-11-02 LAB — COMPREHENSIVE METABOLIC PANEL
ALT: 157 U/L — ABNORMAL HIGH (ref 0–44)
AST: 238 U/L — ABNORMAL HIGH (ref 15–41)
Albumin: 2.6 g/dL — ABNORMAL LOW (ref 3.5–5.0)
Alkaline Phosphatase: 455 U/L — ABNORMAL HIGH (ref 38–126)
Anion gap: 10 (ref 5–15)
BUN: 25 mg/dL — ABNORMAL HIGH (ref 8–23)
CO2: 25 mmol/L (ref 22–32)
Calcium: 9 mg/dL (ref 8.9–10.3)
Chloride: 101 mmol/L (ref 98–111)
Creatinine, Ser: 0.79 mg/dL (ref 0.61–1.24)
GFR, Estimated: >60 mL/min (ref >60)
Glucose, Bld: 132 mg/dL — ABNORMAL HIGH (ref 70–99)
Potassium: 4.1 mmol/L (ref 3.5–5.1)
Sodium: 136 mmol/L (ref 135–145)
Total Bilirubin: 2 mg/dL — ABNORMAL HIGH (ref 0.3–1.2)
Total Protein: 6.3 g/dL — ABNORMAL LOW (ref 6.5–8.1)

## 2020-11-02 MED ORDER — LIDOCAINE-PRILOCAINE 2.5-2.5 % EX CREA: TOPICAL_CREAM | CUTANEOUS | 3 refills | Status: AC

## 2020-11-02 MED ORDER — FENTANYL 25 MCG/HR TD PT72: 1.0000 | MEDICATED_PATCH | TRANSDERMAL | 0 refills | Status: DC

## 2020-11-02 MED ORDER — HEPARIN SOD (PORK) LOCK FLUSH 100 UNIT/ML IV SOLN
500.0000 [IU] | Freq: Once | INTRAVENOUS | Status: AC | PRN
  Administered 2020-11-02: 500 [IU]
  Filled 2020-11-02: qty 5

## 2020-11-02 MED ORDER — FENTANYL 12 MCG/HR TD PT72: 1.0000 | MEDICATED_PATCH | TRANSDERMAL | 0 refills | Status: DC

## 2020-11-02 NOTE — Progress Notes (Signed)
Pt is here with family for palliative care visit to discuss goals of care.

## 2020-11-02 NOTE — Progress Notes (Signed)
[place]  Telephone:([phone] [phone]   Name: David Johnson Date: 11/02/2020 MRN: [id number]  DOB: 30-Aug-1945  Patient Care Team: [place] Pink, MD as PCP - General (Family Medicine)    REASON FOR CONSULTATION: AN SCHNABEL is a 75 y.o. 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  PMH is also notable for COPD and OSA requiring CPAP.  Palliative care was consulted help address goals manage ongoing symptoms.  SOCIAL HISTORY:     reports that he has been smoking cigarettes and cigars. He has a 52.50 pack-year smoking history. He has never used smokeless tobacco. He reports that he does not currently use alcohol. He reports that he does not use drugs.  Patient is married and lives at home with his wife and daughter.  He has another daughter who lives nearby.  Patient works as a Medical laboratory scientific officer for [employer]:  None on file  CODE STATUS: DNR/DNI (DNR and MOST form signed on 11/02/2020)  PAST MEDICAL HISTORY: Past Medical History:  Diagnosis Date   Adenomatous polyps    Asthma    Barrett's esophagus    Colon polyp    COPD (chronic obstructive pulmonary disease) ([place])    uses inhaler, "i may have COPD"   Dyspnea    Eye injury    GERD (gastroesophageal reflux disease)    History of gonorrhea    History of pneumonia    Lung cancer ([place])    Obesity    Pneumonia    Reflux esophagitis    Sleep apnea     PAST SURGICAL HISTORY:  Past Surgical History:  Procedure Laterality Date   CATARACT EXTRACTION Left    COLONOSCOPY     COLONOSCOPY WITH PROPOFOL N/A 05/19/2015   Procedure: COLONOSCOPY WITH PROPOFOL;  Surgeon: [name], MD;  Location: [facility] ENDOSCOPY;  Service: Endoscopy;  Laterality: N/A;   COLONOSCOPY WITH PROPOFOL N/A 09/11/2018   Procedure: COLONOSCOPY WITH PROPOFOL;   Surgeon: [name], MD;  Location: [facility] ENDOSCOPY;  Service: Endoscopy;  Laterality: N/A;   ESOPHAGOGASTRODUODENOSCOPY (EGD) WITH PROPOFOL N/A 05/19/2015   Procedure: ESOPHAGOGASTRODUODENOSCOPY (EGD) WITH PROPOFOL;  Surgeon: [name], MD;  Location: [facility] ENDOSCOPY;  Service: Endoscopy;  Laterality: N/A;   ESOPHAGOGASTRODUODENOSCOPY (EGD) WITH PROPOFOL N/A 10/11/2016   Procedure: ESOPHAGOGASTRODUODENOSCOPY (EGD) WITH PROPOFOL;  Surgeon: [name], MD;  Location: [facility] ENDOSCOPY;  Service: Endoscopy;  Laterality: N/A;   ESOPHAGOGASTRODUODENOSCOPY (EGD) WITH PROPOFOL N/A 01/01/2018   Procedure: ESOPHAGOGASTRODUODENOSCOPY (EGD) WITH PROPOFOL;  Surgeon: [name], MD;  Location: [facility] ENDOSCOPY;  Service: Endoscopy;  Laterality: N/A;   ESOPHAGOGASTRODUODENOSCOPY (EGD) WITH PROPOFOL N/A 09/11/2018   Procedure: ESOPHAGOGASTRODUODENOSCOPY (EGD) WITH PROPOFOL;  Surgeon: [name], MD;  Location: [facility] ENDOSCOPY;  Service: Endoscopy;  Laterality: N/A;   EYE SURGERY     IR IMAGING GUIDED PORT INSERTION  04/21/2020    HEMATOLOGY/ONCOLOGY HISTORY:  Oncology History  Small cell lung cancer, right ([place])  04/18/2020 Initial Diagnosis   Small cell lung cancer, right ([place])   04/18/2020 Cancer Staging   Staging form: Lung, AJCC 8th Edition - Clinical: Stage IVB (cT3, cN2, pM1c) - Signed by Sindy Guadeloupe, MD on 04/18/2020   04/24/2020 -  Chemotherapy    Patient is on Treatment Plan: LUNG SCLC CARBOPLATIN + ETOPOSIDE + ATEZOLIZUMAB INDUCTION Q21D / ATEZOLIZUMAB MAINTENANCE Q21D  ALLERGIES:  is allergic to cephalexin.  MEDICATIONS:  Current Outpatient Medications  Medication Sig Dispense Refill   albuterol (PROVENTIL HFA;VENTOLIN HFA) 108 (90 Base) MCG/ACT inhaler 2 puffs q.i.d. p.r.n. short of breath, wheezing, or cough     fentaNYL (DURAGESIC) 25 MCG/HR Place 1 patch onto the skin every 3 (three) days. 10 patch 0   lactulose (CHRONULAC) 10 GM/15ML solution  Take 15-30 mLs (10-20 g total) by mouth 3 (three) times daily as needed (constipation). (Patient not taking: Reported on 10/31/2020) 236 mL 0   levothyroxine (SYNTHROID) 150 MCG tablet Take 1 tablet (150 mcg total) by mouth daily before breakfast. 30 tablet 2   levothyroxine (SYNTHROID) 25 MCG tablet TAKE 1 TABLET BY MOUTH BEFORE BREAKFAST ADD TO 150MCG TO MAKE 175MCG ALTOGETHER 90 tablet 0   lidocaine-prilocaine (EMLA) cream Apply to affected area once 30 g 3   LORazepam (ATIVAN) 0.5 MG tablet Take 1 tablet (0.5 mg total) by mouth 3 (three) times daily as needed (Nausea or vomiting). (Patient not taking: Reported on 10/31/2020) 30 tablet 0   ondansetron (ZOFRAN) 8 MG tablet Take 1 tablet (8 mg total) by mouth 2 (two) times daily as needed for refractory nausea / vomiting. Start on day 3 after carboplatin chemo. (Patient not taking: Reported on 10/31/2020) 30 tablet 1   Oxycodone HCl 20 MG TABS Take 1 tablet (20 mg total) by mouth every 4 (four) hours as needed (for cancer pain). 180 tablet 0   pantoprazole (PROTONIX) 20 MG tablet TAKE 1 TABLET(20 MG) BY MOUTH DAILY 90 tablet 0   polyethylene glycol (MIRALAX) 17 g packet Take 17 g by mouth daily. (Patient not taking: Reported on 10/31/2020) 30 each 2   predniSONE (DELTASONE) 10 MG tablet Take 1 tablet (10 mg total) by mouth as directed. 4 tablets daily with food for 4 days 16 tablet 0   prochlorperazine (COMPAZINE) 10 MG tablet Take 1 tablet (10 mg total) by mouth every 6 (six) hours as needed (Nausea or vomiting). (Patient not taking: Reported on 10/31/2020) 30 tablet 1   senna-docusate (SENNA S) 8.6-50 MG tablet Take 1 tablet by mouth 2 (two) times daily as needed for mild constipation (constipation). (Patient not taking: Reported on 10/31/2020) 90 tablet 3   No current facility-administered medications for this visit.   Facility-Administered Medications Ordered in Other Visits  Medication Dose Route Frequency Provider Last Rate Last Admin   heparin lock  flush 100 UNIT/ML injection             VITAL SIGNS: Temp 98.6 F (37 C) (Oral)  There were no vitals filed for this visit.  Estimated body mass index is 40.49 kg/m as calculated from the following:   Height as of 10/31/20: '6\' 1"'  (1.854 m).   Weight as of 10/31/20: 306 lb 14.4 oz (139.2 kg).  LABS: CBC:    Component Value Date/Time   WBC 7.7 11/02/2020 0816   HGB 12.3 (L) 11/02/2020 0816   HGB 17.3 05/13/2013 1335   HCT 37.8 (L) 11/02/2020 0816   HCT 51.8 05/13/2013 1335   PLT 23 (LL) 11/02/2020 0816   PLT 170 05/13/2013 1335   MCV 92.9 11/02/2020 0816   MCV 93 05/13/2013 1335   NEUTROABS 3.7 11/02/2020 0816   NEUTROABS 7.0 (H) 05/13/2013 1335   LYMPHSABS 2.6 11/02/2020 0816   LYMPHSABS 3.2 05/13/2013 1335   MONOABS 0.5 11/02/2020 0816   MONOABS 1.0 05/13/2013 1335   EOSABS 0.0 11/02/2020 0816   EOSABS 0.2 05/13/2013 1335  BASOSABS 0.1 11/02/2020 0816   BASOSABS 0.1 05/13/2013 1335   Comprehensive Metabolic Panel:    Component Value Date/Time   NA 136 11/02/2020 0816   K 4.1 11/02/2020 0816   CL 101 11/02/2020 0816   CO2 25 11/02/2020 0816   BUN 25 (H) 11/02/2020 0816   CREATININE 0.79 11/02/2020 0816   GLUCOSE 132 (H) 11/02/2020 0816   CALCIUM 9.0 11/02/2020 0816   AST 238 (H) 11/02/2020 0816   ALT 157 (H) 11/02/2020 0816   ALKPHOS 455 (H) 11/02/2020 0816   BILITOT 2.0 (H) 11/02/2020 0816   PROT 6.3 (L) 11/02/2020 0816   ALBUMIN 2.6 (L) 11/02/2020 0816    RADIOGRAPHIC STUDIES: MR Abdomen Wo Contrast  Result Date: 10/20/2020 CLINICAL DATA:  75 year old male with history of metastatic lung cancer. Indeterminate liver lesions noted on prior CT examination. Follow-up study. EXAM: MRI ABDOMEN WITHOUT CONTRAST TECHNIQUE: Multiplanar multisequence MR imaging was performed without the administration of intravenous contrast. COMPARISON:  No prior abdominal MRI. CT of the chest, abdomen and pelvis 09/26/2020. FINDINGS: Comment: Study is severely limited for detection and  characterization of visceral and/or vascular lesions by lack of IV gadolinium. Lower chest: Small right pleural effusion lying dependently. Hepatobiliary: Assessment for hepatic metastasis is severely limited in the absence of IV gadolinium. With these limitations in mind, there are numerous T1 hypointense, T2 hyperintense lesions which demonstrate diffusion restriction, highly concerning for metastatic disease to the liver. The largest of these is in the left lobe of the liver between segmentsr 2 and 3 (axial image 15 of series 9 and coronal image 37 of series 2) measuring 2.9 x 1.6 x 2.6 cm. No intra or extrahepatic biliary ductal dilatation. Gallbladder is moderately distended. Gallbladder wall thickness is normal. No pericholecystic fluid. Small filling defects are noted in the dependent portion of the gallbladder, compatible with gallstones. Pancreas: No definite pancreatic mass or peripancreatic fluid collections or inflammatory changes noted on today's noncontrast examination. No pancreatic ductal dilatation. Spleen:  Unremarkable. Adrenals/Urinary Tract: Small T1 hypointense and T2 hyperintense lesions in the kidneys bilaterally, incompletely characterized on today's noncontrast examination, but likely to represent cysts, largest of which is in the medial aspect of the lower pole of the left kidney measuring 1.2 cm in diameter. No hydroureteronephrosis in the visualized portions of the abdomen. Bilateral adrenal glands are normal in appearance. Stomach/Bowel: Visualized portions are unremarkable. Vascular/Lymphatic: No aneurysm identified in the visualized abdominal vasculature. No lymphadenopathy noted in the abdomen. Other: No significant volume of ascites noted in the visualized portions of the peritoneal cavity. Musculoskeletal: Widespread areas of diffusion restriction are noted in the visualized axial skeleton, suggestive of metastatic disease to the bones. IMPRESSION: 1. Despite the limitations of  today's noncontrast examination, there are numerous diffusion restricting lesions scattered throughout the liver, strongly suspicious for metastatic disease. 2. Widespread metastatic disease to the bones again noted. 3. Small right pleural effusion lying dependently. 4. Cholelithiasis without evidence of acute cholecystitis at this time. Electronically Signed   By: Vinnie Langton M.D.   On: 10/20/2020 08:28   DG Chest Port 1 View  Result Date: 10/19/2020 CLINICAL DATA:  Weakness, cough.  Falls. EXAM: PORTABLE CHEST 1 VIEW COMPARISON:  04/21/2020 and CT chest 10/02/2020. FINDINGS: Right IJ Port-A-Cath tip is in the SVC. Heart is enlarged. Mild basilar predominant interstitial prominence and indistinctness. There may be small bilateral pleural effusions. IMPRESSION: Suspect mild congestive heart failure. Electronically Signed   By: Lorin Picket M.D.   On: 10/19/2020 14:28  ECHOCARDIOGRAM COMPLETE  Result Date: 10/04/2020    ECHOCARDIOGRAM REPORT   Patient Name:   CAROLE DEERE Date of Exam: 10/04/2020 Medical Rec #:  [id number]      Height:       73.0 in Accession #:    [id number]     Weight:       326.4 lb Date of Birth:  May 26, 1945      BSA:          2.650 m Patient Age:    30 years       BP:           129/68 mmHg Patient Gender: M              HR:           76 bpm. Exam Location:  [facility] Procedure: 2D Echo, Color Doppler, Cardiac Doppler and Intracardiac            Opacification Agent Indications:     R07.9 Chest Pain  History:         Patient has no prior history of Echocardiogram examinations.                  COPD; Risk Factors:Sleep Apnea. Asthma.  Sonographer:     Charmayne Sheer RDCS (AE) Referring Phys:  [id number] Weston Anna RAO Diagnosing Phys: Kate Sable MD  Sonographer Comments: Technically difficult study due to poor echo windows. Image acquisition challenging due to patient body habitus and Image acquisition challenging due to COPD. IMPRESSIONS  1. Left ventricular ejection fraction, by  estimation, is 55 to 60%. The left ventricle has normal function. The left ventricle has no regional wall motion abnormalities. Left ventricular diastolic parameters were normal.  2. Right ventricular systolic function is normal. The right ventricular size is normal.  3. The mitral valve is normal in structure. No evidence of mitral valve regurgitation.  4. The aortic valve is normal in structure. Aortic valve regurgitation is not visualized. FINDINGS  Left Ventricle: Left ventricular ejection fraction, by estimation, is 55 to 60%. The left ventricle has normal function. The left ventricle has no regional wall motion abnormalities. Definity contrast agent was given IV to delineate the left ventricular  endocardial Jennife Zaucha. The left ventricular internal cavity size was normal in size. There is no left ventricular hypertrophy. Left ventricular diastolic parameters were normal. Right Ventricle: The right ventricular size is normal. No increase in right ventricular wall thickness. Right ventricular systolic function is normal. Left Atrium: Left atrial size was normal in size. Right Atrium: Right atrial size was normal in size. Pericardium: There is no evidence of pericardial effusion. Mitral Valve: The mitral valve is normal in structure. No evidence of mitral valve regurgitation. MV peak gradient, 2.0 mmHg. The mean mitral valve gradient is 1.0 mmHg. Tricuspid Valve: The tricuspid valve is not well visualized. Tricuspid valve regurgitation is not demonstrated. Aortic Valve: The aortic valve is normal in structure. Aortic valve regurgitation is not visualized. Pulmonic Valve: The pulmonic valve was normal in structure. Pulmonic valve regurgitation is not visualized. Aorta: The aortic root is normal in size and structure. Venous: The inferior vena cava was not well visualized. IAS/Shunts: No atrial level shunt detected by color flow Doppler.  LEFT VENTRICLE PLAX 2D LVIDd:         5.60 cm  Diastology LVIDs:         4.00  cm  LV e' medial:    7.18 cm/s LV PW:  1.20 cm  LV E/e' medial:  8.5 LV IVS:        1.10 cm  LV e' lateral:   7.94 cm/s LVOT diam:     2.20 cm  LV E/e' lateral: 7.7 LVOT Area:     3.80 cm  RIGHT VENTRICLE RV Basal diam:  2.80 cm LEFT ATRIUM           Index LA diam:      3.40 cm 1.28 cm/m LA Vol (A4C): 87.1 ml 32.87 ml/m                        PULMONIC VALVE AORTA                 PV Vmax:       0.78 m/s Ao Root diam: 3.30 cm PV Vmean:      48.100 cm/s                       PV VTI:        0.119 m                       PV Peak grad:  2.4 mmHg                       PV Mean grad:  1.0 mmHg  MITRAL VALVE MV Area (PHT): 2.52 cm    SHUNTS MV Peak grad:  2.0 mmHg    Systemic Diam: 2.20 cm MV Mean grad:  1.0 mmHg MV Vmax:       0.72 m/s MV Vmean:      46.1 cm/s MV Decel Time: 301 msec MV E velocity: 60.80 cm/s MV A velocity: 66.80 cm/s MV E/A ratio:  0.91 Kate Sable MD Electronically signed by Kate Sable MD Signature Date/Time: 10/04/2020/7:11:46 PM    Final      PERFORMANCE STATUS (ECOG) : 1 - Symptomatic but completely ambulatory  Review of Systems Unless otherwise noted, a complete review of systems is negative.  Physical Exam General: NAD Pulmonary: Unlabored Extremities: no edema, no joint deformities Skin: no rashes Neurological: Weakness but otherwise nonfocal  IMPRESSION: Recent MRI of abdomen revealed new liver metastasis.  Patient has had worsening thrombocytopenia and elevated LDH likely reflecting bone marrow involvement of cancer.  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  I met with patient, wife, and daughter today to discuss goals.  Patient admits to having declined over the past weeks.  He has had several recent falls and is having difficulty climbing his stairs at home.  Patient says she is at a point where he would benefit from assistance with bathing.  Patient says that he understands he is  likely nearing end-of-life.  He is trying to get his final affairs in order.  He plans to meet with an attorney to complete a last will and testament.  We discussed the options for future work-up and treatment in light of what appears to be rapidly progressing metastatic disease.  Ultimately, patient said that he was not interested in pursuing further work-up or treatment.  He was scheduled for IVIG today and tomorrow but was willing to forego those treatments.  Instead, he said that he would prefer to be at home and focus on comfort.  He was in agreement with hospice involvement.  I called in an order for hospice to [facility].  Patient stated clearly that he would not want to be resuscitated or have his life prolonged artificially machines.  Patient and family were in agreement DNR/   I completed a MOST form today. The patient and family outlined their wishes for the following treatment decisions:  Cardiopulmonary Resuscitation: Do Not Attempt Resuscitation (DNR/No CPR)  Medical Interventions: Comfort Measures: Keep clean, warm, and dry. Use medication by any route, positioning, wound care, and other measures to relieve pain and suffering. Use oxygen, suction and manual treatment of airway obstruction as needed for comfort. Do not transfer to the hospital unless comfort needs cannot be met in current location.  Antibiotics: Determine use of limitation of antibiotics when infection occurs  IV Fluids: IV fluids for a defined trial period  Feeding Tube: No feeding tube   Symptomatically, patient continues to endorse persistent generalized pain.  He is taking oxycodone 20 mg 4-5 times daily.  We will increase his transdermal fentanyl to 37 mcg.  PLAN: -Best supportive care -Referral for hospice -DNR/DNI -MOST form completed    Patient expressed understanding and was in agreement with this plan. He also understands that He can call the clinic at any time with any questions, concerns, or  complaints.     Time Total: 60 minutes  Visit consisted of counseling and education dealing with the complex and emotionally intense issues of symptom management and palliative care in the setting of serious and potentially life-threatening illness.Greater than 50%  of this time was spent counseling and coordinating care related to the above assessment and plan.  Signed by: Altha Harm, PhD, NP-C

## 2020-11-03 ENCOUNTER — Encounter: Payer: Self-pay | Admitting: *Deleted

## 2020-11-03 ENCOUNTER — Inpatient Hospital Stay: Payer: Managed Care, Other (non HMO)

## 2020-11-07 ENCOUNTER — Inpatient Hospital Stay: Payer: Managed Care, Other (non HMO) | Admitting: Oncology

## 2020-11-07 ENCOUNTER — Other Ambulatory Visit: Payer: Self-pay | Admitting: Hospice and Palliative Medicine

## 2020-11-07 ENCOUNTER — Inpatient Hospital Stay: Payer: Managed Care, Other (non HMO)

## 2020-11-07 DIAGNOSIS — C3491 Malignant neoplasm of unspecified part of right bronchus or lung: Secondary | ICD-10-CM

## 2020-11-07 MED ORDER — FENTANYL 12 MCG/HR TD PT72: 1.0000 | MEDICATED_PATCH | TRANSDERMAL | 0 refills | Status: AC

## 2020-11-07 MED ORDER — FENTANYL 25 MCG/HR TD PT72: 1.0000 | MEDICATED_PATCH | TRANSDERMAL | 0 refills | Status: AC

## 2020-11-07 MED ORDER — LORAZEPAM 0.5 MG PO TABS: 0.5000 mg | ORAL_TABLET | Freq: Three times a day (TID) | ORAL | 0 refills | Status: AC | PRN

## 2020-11-07 NOTE — Progress Notes (Signed)
I received a call from patient's hospice nurse.  They are requesting a refill of his transdermal fentanyl.  I sent this prescription last week but reportedly patient had patches fall off and is in need of a refill.  Nurse to follow use of fentanyl closely.  We will go ahead and send the refill as requested.

## 2020-11-10 ENCOUNTER — Ambulatory Visit: Payer: Managed Care, Other (non HMO)

## 2020-11-13 ENCOUNTER — Telehealth: Payer: Managed Care, Other (non HMO) | Admitting: Hospice and Palliative Medicine

## 2020-11-25 DEATH — deceased

## 2020-12-14 ENCOUNTER — Other Ambulatory Visit: Payer: Self-pay | Admitting: Oncology

## 2021-01-26 ENCOUNTER — Other Ambulatory Visit: Payer: Self-pay | Admitting: Oncology

## 2022-07-08 IMAGING — PT NM PET TUM IMG INITIAL (PI) SKULL BASE T - THIGH
1 of 10 series · 1 of 25 positions shown · non-contrast
Comparison: Chest CT 03/30/2020

CLINICAL DATA: Initial treatment strategy for right middle lobe
lung nodule and mediastinal adenopathy. Abnormal opacity in the
right upper lobe, possible mass.

EXAM:
NUCLEAR MEDICINE PET SKULL BASE TO THIGH
TECHNIQUE: 16.1 mCi F-18 FDG was injected intravenously. Full-ring PET imaging
was performed from the skull base to thigh after the radiotracer. CT
data was obtained and used for attenuation correction and anatomic
localization.
Fasting blood glucose: One hundred twenty-eight mg/dl

[Series 3: ct wb 5.0 b30f · axial · 5.0mm · 0.98mm/px · 1 of 329 slices shown]
[im 329/329  brain]
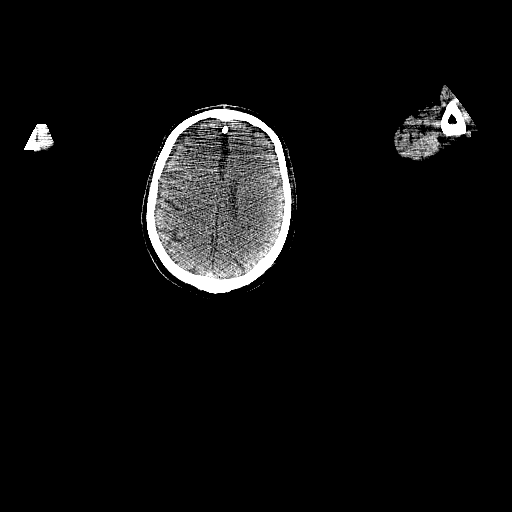

[1 of 25 positions shown; findings below may reference images not displayed]

FINDINGS: Mediastinal blood pool activity: SUV max

Liver activity: SUV max NA

NECK: Diffuse thyroid activity likely from thyroiditis. Mildly
prominent right thyroid lobe. Maximum SUV of the right thyroid lobe
is 10.2 and the left thyroid lobe 7.3.

No definite hypermetabolic activity in the vicinity of the small
left temporal bone lesion shown on recent MRI.

Incidental CT findings: None

CHEST: Abnormal bandlike lesion in the right upper lobe measuring
about 6.8 by 2.4 cm, maximum SUV 10.9, high suspicion for
malignancy.

Large right pleural effusion with hypermetabolic rind of pleural
tissue measuring up to about 1.4 cm in thickness on image 93 series
3, maximum SUV of this rind 13.8, compatible with malignant pleural
effusion.

Conglomerate right paratracheal adenopathy measuring about 3.8 by
5.9 cm on image 106 series 3, maximum SUV 17.1, compatible with
malignancy. A right hilar node measuring 1.5 cm in short axis on
image 119 of series 3 has a maximum SUV of 10.0, compatible with
malignancy.

Hypermetabolic lymph nodes are present in the right pericardial
space. One of several hypermetabolic lymph nodes measures 1.4 cm in
short axis on image 147 of series 3 with maximum SUV

1.3 by 1.0 cm right middle lobe nodule with adjacent mild nodularity
noted just below the minor fissure, maximum SUV 3.7, compatible with
malignancy.

Hypermetabolic activity along the collapsed right lower lobe could
be within the lower lobe for along the visceral pleural margin,
measuring about 1.2 by 1.8 cm on image 149 series 3 with maximum SUV
8.3.

Incidental CT findings: Large malignant right pleural effusion. Mild
cardiomegaly. Mild atherosclerotic calcification of the aortic arch.

ABDOMEN/PELVIS: I do not see a discrete adrenal mass but there is
some faintly accentuated activity along the left adrenal gland with
maximum SUV 7.0. Speckled or noisy PET signal in the abdomen.

Incidental CT findings: Cholelithiasis mild abdominal aortic
atherosclerosis.

SKELETON: There are few scattered skeletal metastatic lesions. This
includes a lytic lesion of the sternal manubrium measuring
approximately 2.9 by 1.8 cm on image 91 of series 3, maximum SUV
7.6. 1.4 by 1.1 cm lytic lesion left iliac crest on image 237 series
3, maximum SUV 8.1. A small hypermetabolic lesion in the right
femoral neck has a maximum SUV of 9.7 but is poorly delineated on
the CT data. Speckled/noisy signal along the spine without
well-defined metastatic involvement of the spine. Activity along the
left proximal humerus is thought to probably be in the joint rather
than in the humeral head, and accordingly probably physiologic.
There is a small focus of hypermetabolic activity in the right
posterior iliac bone without CT correlate, maximum SUV 7.3.

There is a hypermetabolic lesion in the left mid humeral shaft
centrally, maximum SUV 6.5.

Incidental CT findings: Thoracic spondylosis.
IMPRESSION: 1. Hypermetabolic right upper lobe mass with a few scattered osseous
metastatic lesions including the sternal manubrium, bony pelvis,
left mid humerus, and right proximal femur. Large conglomerate right
paratracheal adenopathy with hypermetabolic right hilar adenopathy,
a large malignant right pleural effusion with hypermetabolic pleural
rind, and hypermetabolic pericardial lymph nodes.
2. The 1.3 by 1.0 cm right middle lobe pulmonary nodule is weakly
hypermetabolic and probably a metastatic lesion.
3. Faintly accentuated activity in the left adrenal gland without a
well-defined mass, indeterminate.
4. Diffuse thyroid activity likely from thyroiditis. Somewhat
prominent right thyroid lobe compared to the left.
5. Other imaging findings of potential clinical significance: Aortic
Atherosclerosis ([id number]). Mild cardiomegaly. Cholelithiasis.
